# Patient Record
Sex: Female | Born: 1955
Health system: Southern US, Community
[De-identification: ages and names within clinical notes are randomized; demographics above are authoritative.]

## PROBLEM LIST (undated history)

## (undated) DIAGNOSIS — E669 Obesity, unspecified: Secondary | ICD-10-CM

## (undated) DIAGNOSIS — Z8619 Personal history of other infectious and parasitic diseases: Secondary | ICD-10-CM

## (undated) DIAGNOSIS — N189 Chronic kidney disease, unspecified: Secondary | ICD-10-CM

## (undated) DIAGNOSIS — E119 Type 2 diabetes mellitus without complications: Secondary | ICD-10-CM

## (undated) DIAGNOSIS — R809 Proteinuria, unspecified: Secondary | ICD-10-CM

## (undated) DIAGNOSIS — I10 Essential (primary) hypertension: Secondary | ICD-10-CM

## (undated) DIAGNOSIS — E785 Hyperlipidemia, unspecified: Secondary | ICD-10-CM

## (undated) DIAGNOSIS — I4891 Unspecified atrial fibrillation: Secondary | ICD-10-CM

## (undated) HISTORY — DX: Hyperlipidemia, unspecified: E78.5

## (undated) HISTORY — DX: Type 2 diabetes mellitus without complications: E11.9

## (undated) HISTORY — DX: Personal history of other infectious and parasitic diseases: Z86.19

## (undated) HISTORY — PX: COLONOSCOPY: SHX174

## (undated) HISTORY — PX: TONSILLECTOMY: SUR1361

## (undated) HISTORY — DX: Essential (primary) hypertension: I10

## (undated) HISTORY — DX: Proteinuria, unspecified: R80.9

## (undated) HISTORY — DX: Unspecified atrial fibrillation: I48.91

## (undated) HISTORY — DX: Obesity, unspecified: E66.9

---

## 1999-02-20 ENCOUNTER — Encounter: Admission: RE | Admit: 1999-02-20 | Discharge: 1999-05-21 | Payer: Self-pay | Admitting: Unknown Physician Specialty

## 2001-01-16 ENCOUNTER — Emergency Department (HOSPITAL_COMMUNITY): Admission: EM | Admit: 2001-01-16 | Discharge: 2001-01-16 | Payer: Self-pay | Admitting: Emergency Medicine

## 2004-02-21 ENCOUNTER — Ambulatory Visit (HOSPITAL_COMMUNITY): Admission: RE | Admit: 2004-02-21 | Discharge: 2004-02-21 | Payer: Self-pay | Admitting: Gastroenterology

## 2008-09-10 ENCOUNTER — Emergency Department (HOSPITAL_COMMUNITY): Admission: EM | Admit: 2008-09-10 | Discharge: 2008-09-10 | Payer: Self-pay | Admitting: Family Medicine

## 2009-10-24 ENCOUNTER — Emergency Department (HOSPITAL_COMMUNITY): Admission: EM | Admit: 2009-10-24 | Discharge: 2009-10-25 | Payer: Self-pay | Admitting: Emergency Medicine

## 2009-11-04 ENCOUNTER — Ambulatory Visit (HOSPITAL_COMMUNITY): Admission: RE | Admit: 2009-11-04 | Discharge: 2009-11-04 | Payer: Self-pay | Admitting: Gastroenterology

## 2010-10-30 ENCOUNTER — Other Ambulatory Visit: Payer: Self-pay | Admitting: *Deleted

## 2010-10-30 DIAGNOSIS — Z1231 Encounter for screening mammogram for malignant neoplasm of breast: Secondary | ICD-10-CM

## 2010-11-06 ENCOUNTER — Ambulatory Visit: Payer: Self-pay

## 2010-12-03 LAB — URINALYSIS, ROUTINE W REFLEX MICROSCOPIC
Bilirubin Urine: NEGATIVE
Glucose, UA: NEGATIVE mg/dL
Leukocytes, UA: NEGATIVE
Nitrite: NEGATIVE
Urobilinogen, UA: 0.2 mg/dL (ref 0.0–1.0)
pH: 5 (ref 5.0–8.0)

## 2010-12-03 LAB — DIFFERENTIAL
Eosinophils Absolute: 0.4 10*3/uL (ref 0.0–0.7)
Eosinophils Relative: 5 % (ref 0–5)
Lymphocytes Relative: 35 % (ref 12–46)
Lymphs Abs: 2.9 10*3/uL (ref 0.7–4.0)
Monocytes Absolute: 0.5 10*3/uL (ref 0.1–1.0)
Neutro Abs: 4.6 10*3/uL (ref 1.7–7.7)
Neutrophils Relative %: 54 % (ref 43–77)

## 2010-12-03 LAB — COMPREHENSIVE METABOLIC PANEL
ALT: 19 U/L (ref 0–35)
AST: 20 U/L (ref 0–37)
Albumin: 3.5 g/dL (ref 3.5–5.2)
GFR calc non Af Amer: >60 mL/min (ref >60)
Glucose, Bld: 106 mg/dL — ABNORMAL HIGH (ref 70–99)
Potassium: 3.5 mEq/L (ref 3.5–5.1)
Sodium: 138 mEq/L (ref 135–145)
Total Bilirubin: 0.2 mg/dL — ABNORMAL LOW (ref 0.3–1.2)
Total Protein: 7 g/dL (ref 6.0–8.3)

## 2010-12-03 LAB — URINE MICROSCOPIC-ADD ON

## 2010-12-03 LAB — CBC
HCT: 35.9 % — ABNORMAL LOW (ref 36.0–46.0)
Hemoglobin: 12 g/dL (ref 12.0–15.0)
MCHC: 33.4 g/dL (ref 30.0–36.0)
Platelets: 300 10*3/uL (ref 150–400)
RDW: 14.6 % (ref 11.5–15.5)
WBC: 8.4 10*3/uL (ref 4.0–10.5)

## 2010-12-03 LAB — GLUCOSE, CAPILLARY: Glucose-Capillary: 97 mg/dL (ref 70–99)

## 2010-12-03 LAB — LIPASE, BLOOD: Lipase: 38 U/L (ref 11–59)

## 2011-01-29 NOTE — Op Note (Signed)
NAME:  Kathy [name], Kathy [name]                     ACCOUNT NO.:  [id number]   MEDICAL RECORD NO.:  [id number]                   PATIENT TYPE:  AMB   LOCATION:  ENDO                                 FACILITY:  [facility]   PHYSICIAN:  Jeryl Columbia, M.D.                 DATE OF BIRTH:  [birth year]   DATE OF PROCEDURE:  02/21/2004  DATE OF DISCHARGE:                                 OPERATIVE REPORT   PROCEDURE:  Colonoscopy.   INDICATION:  Iron deficiency, probably due to her heavy periods but also  with a family history of colon cancer.  Consent was signed after risks,  benefits, methods, options thoroughly discussed in the office.   MEDICINES USED:  1. Demerol 50.  2. Versed 6.   DESCRIPTION OF PROCEDURE:  Rectal inspection was pertinent for external  hemorrhoids, small.  Digital exam was negative.  Video pediatric adjustable  colonoscope was inserted, easily advanced around the colon to the cecum.  This did require rolling her on her back and some abdominal pressure.  No  obvious abnormality was seen on insertion.  The cecum was identified by the  appendiceal orifice and the ileocecal valve.  In fact, the scope was  inserted a short ways in the terminal ileum which was normal.  Photodocumentation was obtained.  No blood was seen coming from above nor in  the colon.  The scope was slowly withdrawn.  The prep was adequate.  The  prep was better on the left than the right but with washing and suctioning,  adequate visualization was obtained.  On slow withdrawal through the colon,  no lesions were seen.  Specifically, no polyps, tumors, masses, diverticula,  or signs of bleeding.  Once back in the rectum, anorectal pull-through and  retroflexion confirmed some small hemorrhoids.  The scope was reinserted a  short ways up the left side of the colon; air was suctioned, scope removed.  The patient tolerated the procedure well.  There was no obvious immediate  complication.   ENDOSCOPIC  DIAGNOSES:  1. Small internal and external hemorrhoids.  2. Otherwise, within normal limits to the terminal ileum.   PLAN:  1. Recheck colon screening in 5 years.  2. Follow up p.r.n. or in 2 months to recheck symptoms, guaiacs, possibly a     CBC and make sure no further     gastrointestinal work-up and plans are needed.  3. Otherwise, resume iron and any further work-up and plans per Dr. Inda Merlin     and gynecology with regard to her heavy periods.                                               Jeryl Columbia, M.D.    MEM/MEDQ  D:  02/21/2004  T:  02/21/2004  Job:  [id number]   cc:   Frann Rider, M.D.  [place]  [place] [address]  Fax: [phone]

## 2012-11-16 ENCOUNTER — Encounter (INDEPENDENT_AMBULATORY_CARE_PROVIDER_SITE_OTHER): Payer: 59 | Admitting: Ophthalmology

## 2012-11-16 DIAGNOSIS — H2510 Age-related nuclear cataract, unspecified eye: Secondary | ICD-10-CM

## 2012-11-16 DIAGNOSIS — I10 Essential (primary) hypertension: Secondary | ICD-10-CM

## 2012-11-16 DIAGNOSIS — E11319 Type 2 diabetes mellitus with unspecified diabetic retinopathy without macular edema: Secondary | ICD-10-CM

## 2012-11-16 DIAGNOSIS — E1139 Type 2 diabetes mellitus with other diabetic ophthalmic complication: Secondary | ICD-10-CM

## 2012-11-16 DIAGNOSIS — H43819 Vitreous degeneration, unspecified eye: Secondary | ICD-10-CM

## 2012-11-16 DIAGNOSIS — H35039 Hypertensive retinopathy, unspecified eye: Secondary | ICD-10-CM

## 2013-08-06 ENCOUNTER — Encounter: Payer: Self-pay | Admitting: Medical

## 2013-08-06 ENCOUNTER — Ambulatory Visit (INDEPENDENT_AMBULATORY_CARE_PROVIDER_SITE_OTHER): Payer: 59 | Admitting: Medical

## 2013-08-06 VITALS — BP 132/90 | Temp 98.4°F | Ht 69.0 in | Wt 214.0 lb

## 2013-08-06 DIAGNOSIS — E118 Type 2 diabetes mellitus with unspecified complications: Secondary | ICD-10-CM | POA: Insufficient documentation

## 2013-08-06 DIAGNOSIS — Z23 Encounter for immunization: Secondary | ICD-10-CM

## 2013-08-06 DIAGNOSIS — I10 Essential (primary) hypertension: Secondary | ICD-10-CM | POA: Insufficient documentation

## 2013-08-06 DIAGNOSIS — R12 Heartburn: Secondary | ICD-10-CM

## 2013-08-06 DIAGNOSIS — E7849 Other hyperlipidemia: Secondary | ICD-10-CM | POA: Insufficient documentation

## 2013-08-06 DIAGNOSIS — K5900 Constipation, unspecified: Secondary | ICD-10-CM

## 2013-08-06 DIAGNOSIS — E119 Type 2 diabetes mellitus without complications: Secondary | ICD-10-CM

## 2013-08-06 DIAGNOSIS — E785 Hyperlipidemia, unspecified: Secondary | ICD-10-CM

## 2013-08-06 MED ORDER — SAXAGLIPTIN-METFORMIN ER 5-1000 MG PO TB24: 1.0000 | ORAL_TABLET | Freq: Every day | ORAL | Status: DC

## 2013-08-06 MED ORDER — SIMVASTATIN 20 MG PO TABS: 20.0000 mg | ORAL_TABLET | Freq: Every day | ORAL | Status: DC

## 2013-08-06 MED ORDER — LISINOPRIL-HYDROCHLOROTHIAZIDE 20-25 MG PO TABS: 1.0000 | ORAL_TABLET | Freq: Every day | ORAL | Status: DC

## 2013-08-06 NOTE — Patient Instructions (Signed)
Diabetes Meal Planning Guide The diabetes meal planning guide is a tool to help you plan your meals and snacks. It is important for people with diabetes to manage their blood glucose (sugar) levels. Choosing the right foods and the right amounts throughout your day will help control your blood glucose. Eating right can even help you improve your blood pressure and reach or maintain a healthy weight.  CARBOHYDRATE COUNTING MADE EASY When you eat carbohydrates, they turn to sugar. This raises your blood glucose level. Counting carbohydrates can help you control this level so you feel better. When you plan your meals by counting carbohydrates, you can have more flexibility in what you eat and balance your medicine with your food intake.  Carbohydrate counting simply means adding up the total amount of carbohydrate grams in your meals and snacks. Try to eat about the same amount at each meal. Foods with carbohydrates are listed below.   Each portion below is 1 carbohydrate serving or 15 grams of carbohydrates.    1800 Calorie Diet for Diabetes Meal Planning The 1800 calorie diet is designed for eating up to 1800 calories each day. Following this diet and making healthy meal choices can help improve overall health. This diet controls blood sugar (glucose) levels and can also help lower blood pressure and cholesterol.  SERVING SIZES Measuring foods and serving sizes helps to make sure you are getting the right amount of food. The list below tells how big or small some common serving sizes are:  1 oz.........4 stacked dice.  3 oz........Marland KitchenDeck of cards.  1 tsp.......Marland KitchenTip of little finger.  1 tbs......Marland KitchenMarland KitchenThumb.  2 tbs.......Marland KitchenGolf ball.   cup......Marland KitchenHalf of a fist.  1 cup.......Marland KitchenA fist.   GUIDELINES FOR CHOOSING FOODS The goal of this diet is to eat a variety of foods and limit calories to 1800 each day. This can be done by choosing foods that are low in calories and fat. The diet also suggests  eating small amounts of food frequently. Doing this helps control your blood glucose levels so they do not get too high or too low. Each meal or snack may include a protein food source to help you feel more satisfied and to stabilize your blood glucose. Try to eat about the same amount of food around the same time each day. This includes weekend days, travel days, and days off work. Space your meals about 4 to 5 hours apart and add a snack between them if you wish.  For example, a daily food plan could include breakfast, a morning snack, lunch, dinner, and an evening snack. Healthy meals and snacks include whole grains, vegetables, fruits, lean meats, poultry, fish, and dairy products. As you plan your meals, select a variety of foods. Choose from the bread and starch, vegetable, fruit, dairy, and meat/protein groups. Examples of foods from each group and their suggested serving sizes are listed below. Use measuring cups and spoons to become familiar with what a healthy portion looks like. Bread and Starch Each serving equals 15 grams of carbohydrates.  1 slice bread.   bagel.   cup cold cereal (unsweetened).   cup hot cereal or mashed potatoes.  1 small potato (size of a computer mouse).   cup cooked pasta or rice.   English muffin.  1 cup broth-based soup.  3 cups of popcorn.  4 to 6 whole-wheat crackers.   cup cooked beans, peas, or corn. Vegetable Each serving equals 5 grams of carbohydrates.   cup cooked vegetables.  1  cup raw vegetables.   cup tomato or vegetable juice. Fruit Each serving equals 15 grams of carbohydrates.  1 small apple or orange.  1 cup watermelon or strawberries.   cup applesauce (no sugar added).  2 tbs raisins.   banana.   cup canned fruit, packed in water, its own juice, or sweetened with a sugar substitute.   cup unsweetened fruit juice. Dairy Each serving equals 12 to 15 grams of carbohydrates.  1 cup fat-free milk.  6  oz artificially sweetened yogurt or plain yogurt.  1 cup low-fat buttermilk.  1 cup soy milk.  1 cup almond milk. Meat/Protein  1 large egg.  2 to 3 oz meat, poultry, or fish.   cup low-fat cottage cheese.  1 tbs peanut butter.  1 oz low-fat cheese.   cup tuna in water.   cup tofu. Fat  1 tsp oil.  1 tsp trans-fat-free margarine.  1 tsp butter.  1 tsp mayonnaise.  2 tbs avocado.  1 tbs salad dressing.  1 tbs cream cheese.  2 tbs sour cream.  SAMPLE 1800 CALORIE DIET PLAN Breakfast   cup unsweetened cereal (1 carb serving).  1 cup fat-free milk (1 carb serving).  1 slice whole-wheat toast (1 carb serving).   small banana (1 carb serving).  1 scrambled egg.  1 tsp trans-fat-free margarine. Lunch  Tuna sandwich.  2 slices whole-wheat bread (2 carb servings).   cup canned tuna in water, drained.  1 tbs reduced fat mayonnaise.  1 stalk celery, chopped.  2 slices tomato.  1 lettuce leaf.  1 cup carrot sticks.  24 to 30 seedless grapes (2 carb servings).  6 oz light yogurt (1 carb serving). Afternoon Snack  3 graham cracker squares (1 carb serving).  Fat-free milk, 1 cup (1 carb serving).  1 tbs peanut butter. Dinner  3 oz salmon, broiled with 1 tsp oil.  1 cup mashed potatoes (2 carb servings) with 1 tsp trans-fat-free margarine.  1 cup fresh or frozen green beans.  1 cup steamed asparagus.  1 cup fat-free milk (1 carb serving). Evening Snack  3 cups air-popped popcorn (1 carb serving).  2 tbs parmesan cheese sprinkled on top.  MEAL PLAN Use this worksheet to help you make a daily meal plan based on the 1800 calorie diet suggestions. If you are using this plan to help you control your blood glucose, you may interchange carbohydrate-containing foods (dairy, starches, and fruits). Select a variety of fresh foods of varying colors and flavors. The total amount of carbohydrate in your meals or snacks is more important  than making sure you include all of the food groups every time you eat. Choose from the following foods to build your day's meals:  8 Starches.  4 Vegetables.  3 Fruits.  2 Dairy.  6 to 7 oz Meat/Protein.  Up to 4 Fats.  Your dietician can use this worksheet to help you decide how many servings and which types of foods are right for you. BREAKFAST Food Group and Servings / Food Choice Starch ________________________________________________________ Dairy _________________________________________________________ Fruit _________________________________________________________ Meat/Protein __________________________________________________ Fat ___________________________________________________________ LUNCH Food Group and Servings / Food Choice Starch ________________________________________________________ Meat/Protein __________________________________________________ Vegetable _____________________________________________________ Fruit _________________________________________________________ Dairy _________________________________________________________ Fat ___________________________________________________________ Wilhemina Bonito Food Group and Servings / Food Choice Starch ________________________________________________________ Meat/Protein __________________________________________________ Fruit __________________________________________________________ Dairy _________________________________________________________ Wonda Cheng Food Group and Servings / Food Choice Starch _________________________________________________________ Meat/Protein ___________________________________________________ Dairy __________________________________________________________ Vegetable ______________________________________________________ Fruit ___________________________________________________________ Fat ____________________________________________________________ Fermin Schwab Food  Group and Servings / Food Choice  Fruit __________________________________________________________ Meat/Protein ___________________________________________________ Dairy __________________________________________________________ Starch _________________________________________________________ DAILY TOTALS Starch ____________________________ Vegetable _________________________ Fruit _____________________________ Dairy _____________________________ Meat/Protein______________________ Fat _______________________________ Document Released: 03/22/2005 Document Revised: 11/22/2011 Document Reviewed: 07/16/2011 [facility] Patient Information 2013 [place], [place].

## 2013-08-06 NOTE — Progress Notes (Signed)
  Subjective:    Kathy [name] is a 57 y.o. female who presents as a new patient today.  Prior she has just been using [facility] for her routine followups and medications.  She is here today for medication refills and recheck on her chronic issues including diabetes type 2, hyperlipidemia and hypertension.  Diagnosed with diabetes 1999.  Was originally put on metformin, later on added Metaglip was added.  HgbA1c numbers have usually been between 6 and 7%..  she has been out of test strips recently, but normally checks her sugars daily to twice daily.  Fasting glucose numbers 160-170.  2-3 hours postprandial in the evening would be 130-140.  She checks her feet regularly, no concerns.  She trying to eat healthy, walk 30 minutes daily for exercise.  Last eye exam:  11/02/12, possible retinopathy per her eye doctor, but different doctor said she didn't.  Her diet weakness is sugar/sweets.  Uses portion control.   She has a history of hypertension, has been on lisinopril HCTZ since 1999.  Checking blood pressures regularly.  Hx/o hyperlipidemia, on statin.  Has not had yearly flu, never pneumococcal vaccine, last tetanus unsure.    She reports last 2 weeks having some intermittent left chest burning pain, only at nighttime, intermittent not daily, last a few minutes at a time and resolves.   The following portions of the patient's history were reviewed and updated as appropriate: allergies, current medications, past family history, past medical history, past social history, past surgical history and problem list.  ROS as in subjective above    Objective:    BP 132/90  Temp(Src) 98.4 F (36.9 C) (Oral)  Ht 5\' 9"  (1.753 m)  Wt 214 lb (97.07 kg)  BMI 31.59 kg/m2  General appearance: alert, no distress, WD/WN, AA female Neck: supple, no lymphadenopathy, no thyromegaly, no masses Heart: RRR, normal S1, S2, no murmurs Lungs: CTA bilaterally, no wheezes, rhonchi, or  rales Abdomen: +bs, soft, non tender, non distended, no masses, no hepatomegaly, no splenomegaly Pulses: 2+ symmetric, upper and lower extremities, normal cap refill Ext: no edema Foot exam: not examined due to tall hose on today.   Will defer to next visit.   Assessment:   Encounter Diagnoses  Name Primary?  . Type II or unspecified type diabetes mellitus without mention of complication, not stated as uncontrolled Yes  . Hyperlipidemia   . Essential hypertension, benign   . Need for prophylactic vaccination and inoculation against influenza   . Need for prophylactic vaccination against Streptococcus pneumoniae (pneumococcus)   . Heartburn   . Unspecified constipation       Plan:   DM type 2 - stop metformin, change to Kombiglyze XR 5/1000mg  daily.  Discussed healthy diabetic diet, continuing exercise, gave handout on recommendations for diet, followup in 3 months. Hyperlipidemia - continue Zocor 20 mg daily HTN - continue current medication lisinopril HCT 20/25 mg daily Will request records from both ophthalmology and urgent care including recent labs Counseled on the influenza virus vaccine.  Vaccine information sheet given.  Influenza vaccine given after consent obtained. Counseled on the pneumococcal vaccine.  Vaccine information sheet given.  Pneumococcal vaccine given after consent obtained. Heartburn - can use OTC H2 blocker prn, avoid acidic and spicy foods constipation - discussed fiber and water intake

## 2013-11-06 ENCOUNTER — Encounter: Payer: Self-pay | Admitting: Medical

## 2013-11-06 ENCOUNTER — Ambulatory Visit (INDEPENDENT_AMBULATORY_CARE_PROVIDER_SITE_OTHER): Payer: 59 | Admitting: Medical

## 2013-11-06 VITALS — BP 140/82 | HR 64 | Temp 98.0°F | Resp 16 | Wt 217.0 lb

## 2013-11-06 DIAGNOSIS — E785 Hyperlipidemia, unspecified: Secondary | ICD-10-CM

## 2013-11-06 DIAGNOSIS — I10 Essential (primary) hypertension: Secondary | ICD-10-CM

## 2013-11-06 DIAGNOSIS — E119 Type 2 diabetes mellitus without complications: Secondary | ICD-10-CM

## 2013-11-06 LAB — CBC
HEMATOCRIT: 36.1 % (ref 36.0–46.0)
Hemoglobin: 11.9 g/dL — ABNORMAL LOW (ref 12.0–15.0)
MCH: 26.1 pg (ref 26.0–34.0)
MCHC: 33 g/dL (ref 30.0–36.0)
MCV: 79.2 fL (ref 78.0–100.0)
Platelets: 337 10*3/uL (ref 150–400)
RBC: 4.56 MIL/uL (ref 3.87–5.11)
RDW: 14.2 % (ref 11.5–15.5)
WBC: 7.3 10*3/uL (ref 4.0–10.5)

## 2013-11-06 LAB — COMPREHENSIVE METABOLIC PANEL
ALBUMIN: 4 g/dL (ref 3.5–5.2)
ALK PHOS: 90 U/L (ref 39–117)
ALT: 11 U/L (ref 0–35)
AST: 11 U/L (ref 0–37)
BUN: 20 mg/dL (ref 6–23)
CALCIUM: 10 mg/dL (ref 8.4–10.5)
CO2: 23 mEq/L (ref 19–32)
CREATININE: 0.8 mg/dL (ref 0.50–1.10)
Chloride: 104 mEq/L (ref 96–112)
Glucose, Bld: 172 mg/dL — ABNORMAL HIGH (ref 70–99)
Potassium: 4.3 mEq/L (ref 3.5–5.3)
SODIUM: 139 meq/L (ref 135–145)
TOTAL PROTEIN: 7.5 g/dL (ref 6.0–8.3)
Total Bilirubin: 0.4 mg/dL (ref 0.2–1.2)

## 2013-11-06 LAB — HEMOGLOBIN A1C
Hgb A1c MFr Bld: 7.7 % — ABNORMAL HIGH (ref <5.7)
MEAN PLASMA GLUCOSE: 174 mg/dL — AB (ref <117)

## 2013-11-06 LAB — LIPID PANEL
CHOL/HDL RATIO: 2.3 ratio
Cholesterol: 183 mg/dL (ref 0–200)
HDL: 80 mg/dL (ref >39)
LDL CALC: 90 mg/dL (ref 0–99)
TRIGLYCERIDES: 65 mg/dL (ref <150)
VLDL: 13 mg/dL (ref 0–40)

## 2013-11-06 NOTE — Assessment & Plan Note (Signed)
Compliant with medication no side effects.

## 2013-11-06 NOTE — Assessment & Plan Note (Signed)
Compliant with medication, no side effects of the medication. Does not check her blood pressures on routine basis.  Limits salt intake.

## 2013-11-06 NOTE — Assessment & Plan Note (Signed)
Checks morning glucose every other day, and since we changed to Kombiglyze XR 01/999 at last visit, most of her sugars have been under 130 fasting. However when she does eat something and she should not be eating, she definitely sees higher numbers. She tries to limit sweets.  She does have several questions about diet today. Her last nutritionist visit was several years ago. She checks her feet some days, not every day. Last eye doctor visit was just a few months ago. No other concerns, no symptoms of hypoglycemia.

## 2013-11-06 NOTE — Patient Instructions (Signed)
Diabetes Meal Planning Guide The diabetes meal planning guide is a tool to help you plan your meals and snacks. It is important for people with diabetes to manage their blood glucose (sugar) levels. Choosing the right foods and the right amounts throughout your day will help control your blood glucose. Eating right can even help you improve your blood pressure and reach or maintain a healthy weight.  CARBOHYDRATE COUNTING MADE EASY When you eat carbohydrates, they turn to sugar. This raises your blood glucose level. Counting carbohydrates can help you control this level so you feel better. When you plan your meals by counting carbohydrates, you can have more flexibility in what you eat and balance your medicine with your food intake.  Carbohydrate counting simply means adding up the total amount of carbohydrate grams in your meals and snacks. Try to eat about the same amount at each meal. Foods with carbohydrates are listed below.   Each portion below is 1 carbohydrate serving or 15 grams of carbohydrates.    1800 Calorie Diet for Diabetes Meal Planning The 1800 calorie diet is designed for eating up to 1800 calories each day. Following this diet and making healthy meal choices can help improve overall health. This diet controls blood sugar (glucose) levels and can also help lower blood pressure and cholesterol.  SERVING SIZES Measuring foods and serving sizes helps to make sure you are getting the right amount of food. The list below tells how big or small some common serving sizes are:  1 oz.........4 stacked dice.  3 oz........Marland KitchenDeck of cards.  1 tsp.......Marland KitchenTip of little finger.  1 tbs......Marland KitchenMarland KitchenThumb.  2 tbs.......Marland KitchenGolf ball.   cup......Marland KitchenHalf of a fist.  1 cup.......Marland KitchenA fist.   GUIDELINES FOR CHOOSING FOODS The goal of this diet is to eat a variety of foods and limit calories to 1800 each day. This can be done by choosing foods that are low in calories and fat. The diet also suggests  eating small amounts of food frequently. Doing this helps control your blood glucose levels so they do not get too high or too low. Each meal or snack may include a protein food source to help you feel more satisfied and to stabilize your blood glucose. Try to eat about the same amount of food around the same time each day. This includes weekend days, travel days, and days off work. Space your meals about 4 to 5 hours apart and add a snack between them if you wish.  For example, a daily food plan could include breakfast, a morning snack, lunch, dinner, and an evening snack. Healthy meals and snacks include whole grains, vegetables, fruits, lean meats, poultry, fish, and dairy products. As you plan your meals, select a variety of foods. Choose from the bread and starch, vegetable, fruit, dairy, and meat/protein groups. Examples of foods from each group and their suggested serving sizes are listed below. Use measuring cups and spoons to become familiar with what a healthy portion looks like. Bread and Starch Each serving equals 15 grams of carbohydrates.  1 slice bread.   bagel.   cup cold cereal (unsweetened).   cup hot cereal or mashed potatoes.  1 small potato (size of a computer mouse).   cup cooked pasta or rice.   English muffin.  1 cup broth-based soup.  3 cups of popcorn.  4 to 6 whole-wheat crackers.   cup cooked beans, peas, or corn. Vegetable Each serving equals 5 grams of carbohydrates.   cup cooked vegetables.  1  cup raw vegetables.   cup tomato or vegetable juice. Fruit Each serving equals 15 grams of carbohydrates.  1 small apple or orange.  1 cup watermelon or strawberries.   cup applesauce (no sugar added).  2 tbs raisins.   banana.   cup canned fruit, packed in water, its own juice, or sweetened with a sugar substitute.   cup unsweetened fruit juice. Dairy Each serving equals 12 to 15 grams of carbohydrates.  1 cup fat-free milk.  6  oz artificially sweetened yogurt or plain yogurt.  1 cup low-fat buttermilk.  1 cup soy milk.  1 cup almond milk. Meat/Protein  1 large egg.  2 to 3 oz meat, poultry, or fish.   cup low-fat cottage cheese.  1 tbs peanut butter.  1 oz low-fat cheese.   cup tuna in water.   cup tofu. Fat  1 tsp oil.  1 tsp trans-fat-free margarine.  1 tsp butter.  1 tsp mayonnaise.  2 tbs avocado.  1 tbs salad dressing.  1 tbs cream cheese.  2 tbs sour cream.  SAMPLE 1800 CALORIE DIET PLAN Breakfast   cup unsweetened cereal (1 carb serving).  1 cup fat-free milk (1 carb serving).  1 slice whole-wheat toast (1 carb serving).   small banana (1 carb serving).  1 scrambled egg.  1 tsp trans-fat-free margarine. Lunch  Tuna sandwich.  2 slices whole-wheat bread (2 carb servings).   cup canned tuna in water, drained.  1 tbs reduced fat mayonnaise.  1 stalk celery, chopped.  2 slices tomato.  1 lettuce leaf.  1 cup carrot sticks.  24 to 30 seedless grapes (2 carb servings).  6 oz light yogurt (1 carb serving). Afternoon Snack  3 graham cracker squares (1 carb serving).  Fat-free milk, 1 cup (1 carb serving).  1 tbs peanut butter. Dinner  3 oz salmon, broiled with 1 tsp oil.  1 cup mashed potatoes (2 carb servings) with 1 tsp trans-fat-free margarine.  1 cup fresh or frozen green beans.  1 cup steamed asparagus.  1 cup fat-free milk (1 carb serving). Evening Snack  3 cups air-popped popcorn (1 carb serving).  2 tbs parmesan cheese sprinkled on top.  MEAL PLAN Use this worksheet to help you make a daily meal plan based on the 1800 calorie diet suggestions. If you are using this plan to help you control your blood glucose, you may interchange carbohydrate-containing foods (dairy, starches, and fruits). Select a variety of fresh foods of varying colors and flavors. The total amount of carbohydrate in your meals or snacks is more important  than making sure you include all of the food groups every time you eat. Choose from the following foods to build your day's meals:  8 Starches.  4 Vegetables.  3 Fruits.  2 Dairy.  6 to 7 oz Meat/Protein.  Up to 4 Fats.  Your dietician can use this worksheet to help you decide how many servings and which types of foods are right for you. BREAKFAST Food Group and Servings / Food Choice Starch ________________________________________________________ Dairy _________________________________________________________ Fruit _________________________________________________________ Meat/Protein __________________________________________________ Fat ___________________________________________________________ LUNCH Food Group and Servings / Food Choice Starch ________________________________________________________ Meat/Protein __________________________________________________ Vegetable _____________________________________________________ Fruit _________________________________________________________ Dairy _________________________________________________________ Fat ___________________________________________________________ Wilhemina Bonito Food Group and Servings / Food Choice Starch ________________________________________________________ Meat/Protein __________________________________________________ Fruit __________________________________________________________ Dairy _________________________________________________________ Wonda Cheng Food Group and Servings / Food Choice Starch _________________________________________________________ Meat/Protein ___________________________________________________ Dairy __________________________________________________________ Vegetable ______________________________________________________ Fruit ___________________________________________________________ Fat ____________________________________________________________ Fermin Schwab Food  Group and Servings / Food Choice  Fruit __________________________________________________________ Meat/Protein ___________________________________________________ Dairy __________________________________________________________ Starch _________________________________________________________ DAILY TOTALS Starch ____________________________ Vegetable _________________________ Fruit _____________________________ Dairy _____________________________ Meat/Protein______________________ Fat _______________________________ Document Released: 03/22/2005 Document Revised: 11/22/2011 Document Reviewed: 07/16/2011 [facility] Patient Information 2013 [place], [place].

## 2013-11-06 NOTE — Progress Notes (Signed)
   Subjective:   Kathy [name] is a 58 y.o. female presenting on 11/06/2013 with 3 month follow-up  She reports trying to eat a healthy diet, exercising with walking aerobics. Compliant with her medications .  She first came here as a new patient in November, thus this is her first routine diabetic follow up, fasting for labs today  No specific new problems  Essential hypertension, benign Compliant with medication, no side effects of the medication. Does not check her blood pressures on routine basis.  Limits salt intake.  Hyperlipidemia Compliant with medication no side effects.  Type II or unspecified type diabetes mellitus without mention of complication, not stated as uncontrolled Checks morning glucose every other day, and since we changed to Kombiglyze XR 01/999 at last visit, most of her sugars have been under 130 fasting. However when she does eat something and she should not be eating, she definitely sees higher numbers. She tries to limit sweets.  She does have several questions about diet today. Her last nutritionist visit was several years ago. She checks her feet some days, not every day. Last eye doctor visit was just a few months ago. No other concerns, no symptoms of hypoglycemia.  No other complaint.  Review of Systems ROS as in subjective      Objective:     Filed Vitals:   11/06/13 1040  BP: 140/82  Pulse: 64  Temp: 98 F (36.7 C)  Resp: 16    General appearance: alert, no distress, WD/WN, AA female Neck: supple, no lymphadenopathy, no thyromegaly, no masses, no bruits Heart: RRR, normal S1, S2, no murmurs Lungs: CTA bilaterally, no wheezes, rhonchi, or rales Abdomen: +bs, soft, non tender, non distended, no masses, no hepatomegaly, no splenomegaly Pulses: 2+ symmetric, upper and lower extremities, normal cap refill See separate foot exam     Assessment: Encounter Diagnoses  Name Primary?  . Type II or unspecified type diabetes mellitus  without mention of complication, not stated as uncontrolled Yes  . Essential hypertension, benign   . Hyperlipidemia      Plan: Routine labs today, continue current medications, continue regular exercise, I have reviewed her most recent eye exam from February 2015, recommended daily foot checks, checking fasting glucose every morning.  We spent the majority of the visit today going over carb counting, carbohydrate choices, meal planning, handout given. Consider nutritionist referral next visit   [name] was seen today for 3 month follow-up.  Diagnoses and associated orders for this visit:  Type II or unspecified type diabetes mellitus without mention of complication, not stated as uncontrolled - Comprehensive metabolic panel - Hemoglobin A1c - HM DIABETES FOOT EXAM - Lipid panel - Microalbumin / creatinine urine ratio - CBC  Essential hypertension, benign - Comprehensive metabolic panel - Hemoglobin A1c - HM DIABETES FOOT EXAM - Lipid panel - Microalbumin / creatinine urine ratio - CBC  Hyperlipidemia - Comprehensive metabolic panel - Hemoglobin A1c - HM DIABETES FOOT EXAM - Lipid panel - Microalbumin / creatinine urine ratio - CBC    Return pending labs.

## 2013-11-07 ENCOUNTER — Other Ambulatory Visit: Payer: Self-pay | Admitting: Medical

## 2013-11-07 LAB — MICROALBUMIN / CREATININE URINE RATIO
Creatinine, Urine: 58.7 mg/dL
Microalb Creat Ratio: 1657.9 mg/g — ABNORMAL HIGH (ref 0.0–30.0)
Microalb, Ur: 97.32 mg/dL — ABNORMAL HIGH (ref 0.00–1.89)

## 2013-11-07 MED ORDER — LISINOPRIL-HYDROCHLOROTHIAZIDE 20-25 MG PO TABS: 1.0000 | ORAL_TABLET | Freq: Every day | ORAL | Status: DC

## 2013-11-07 MED ORDER — SAXAGLIPTIN-METFORMIN ER 5-1000 MG PO TB24: 1.0000 | ORAL_TABLET | Freq: Every day | ORAL | Status: DC

## 2013-11-07 MED ORDER — SIMVASTATIN 20 MG PO TABS: 20.0000 mg | ORAL_TABLET | Freq: Every day | ORAL | Status: DC

## 2013-11-07 NOTE — Progress Notes (Signed)
LMOM TO CB. CLS 

## 2013-11-09 NOTE — Progress Notes (Signed)
No answer, no voicemail pick up

## 2014-01-31 ENCOUNTER — Telehealth: Payer: Self-pay | Admitting: Medical

## 2014-01-31 NOTE — Telephone Encounter (Signed)
Patient called for refills but per chart Shane refilled 11/07/13. She was calling in with old Rx # off bottles, she will call pharmacy back and have them check under her name and WCB if any problems

## 2014-03-13 ENCOUNTER — Encounter: Payer: Self-pay | Admitting: Medical

## 2014-03-13 ENCOUNTER — Telehealth: Payer: Self-pay | Admitting: Medical

## 2014-03-13 ENCOUNTER — Ambulatory Visit (INDEPENDENT_AMBULATORY_CARE_PROVIDER_SITE_OTHER): Payer: 59 | Admitting: Medical

## 2014-03-13 VITALS — BP 130/80 | HR 60 | Temp 98.1°F | Resp 14 | Wt 214.0 lb

## 2014-03-13 DIAGNOSIS — I10 Essential (primary) hypertension: Secondary | ICD-10-CM

## 2014-03-13 DIAGNOSIS — R809 Proteinuria, unspecified: Secondary | ICD-10-CM

## 2014-03-13 DIAGNOSIS — H9209 Otalgia, unspecified ear: Secondary | ICD-10-CM

## 2014-03-13 DIAGNOSIS — H9201 Otalgia, right ear: Secondary | ICD-10-CM

## 2014-03-13 DIAGNOSIS — L723 Sebaceous cyst: Secondary | ICD-10-CM

## 2014-03-13 DIAGNOSIS — E785 Hyperlipidemia, unspecified: Secondary | ICD-10-CM

## 2014-03-13 DIAGNOSIS — Z23 Encounter for immunization: Secondary | ICD-10-CM

## 2014-03-13 DIAGNOSIS — E119 Type 2 diabetes mellitus without complications: Secondary | ICD-10-CM

## 2014-03-13 LAB — RENAL FUNCTION PANEL
ALBUMIN: 4.3 g/dL (ref 3.5–5.2)
BUN: 39 mg/dL — AB (ref 6–23)
CALCIUM: 10 mg/dL (ref 8.4–10.5)
CHLORIDE: 106 meq/L (ref 96–112)
CO2: 20 meq/L (ref 19–32)
Creat: 1.08 mg/dL (ref 0.50–1.10)
GLUCOSE: 103 mg/dL — AB (ref 70–99)
PHOSPHORUS: 3.7 mg/dL (ref 2.3–4.6)
POTASSIUM: 4.9 meq/L (ref 3.5–5.3)
SODIUM: 136 meq/L (ref 135–145)

## 2014-03-13 LAB — POCT URINALYSIS DIPSTICK
Bilirubin, UA: NEGATIVE
Glucose, UA: NEGATIVE
Ketones, UA: NEGATIVE
Leukocytes, UA: NEGATIVE
NITRITE UA: NEGATIVE
PH UA: 5
RBC UA: NEGATIVE
Spec Grav, UA: 1.01
UROBILINOGEN UA: NEGATIVE

## 2014-03-13 LAB — POCT GLYCOSYLATED HEMOGLOBIN (HGB A1C): HEMOGLOBIN A1C: 6.7

## 2014-03-13 MED ORDER — AZILSARTAN-CHLORTHALIDONE 40-25 MG PO TABS: 1.0000 | ORAL_TABLET | Freq: Every day | ORAL | Status: DC

## 2014-03-13 NOTE — Progress Notes (Signed)
  Subjective:   Kathy [name] is an 58 y.o. female who presents for follow up of Type 2 diabetes mellitus, hyperlipidemia, HTN.   Patient is checking home blood sugars.   Home blood sugar records: morning BS 120's and the evening is 90's Current symptoms include: none. Patient denies nonthing.  Patient is checking their feet daily. Foot concerns (callous, ulcer, wound, thickened nails, toenail fungus, skin fungus, hammer toe): no concerns Last dilated eye exam 02/2013  Current treatments: Continued kombiglyze which has been effective. Medication compliance: excellent  Current diet: in general, a "healthy" diet   Current exercise: walking Known diabetic complications: none  HTN - compliant with medication without c/o.  Hyperlipidemia - compliant with medication without c/o.  Having some right ear discomfort, unsure why.  No uri symptoms.  Does have ongoing night sweats attributed to menopause   The following portions of the patient's history were reviewed and updated as appropriate: allergies, current medications, past family history, past medical history, past social history, past surgical history and problem list.  ROS as in subjective above    Objective:   Filed Vitals:   03/13/14 0905  BP: 130/80  Pulse: 60  Temp: 98.1 F (36.7 C)  Resp: 14    General appearance: alert, no distress, WD/WN, AA female Skin: left postauricular area with 1.5cm raised papular lesion c/w sebaceous cyst, no erythema or fluctuance, seems to be improving HEENT: normocephalic, sclerae anicteric, TMs pearly, nares patent, no discharge or erythema, pharynx normal Oral cavity: MMM, no lesions Neck: supple, no lymphadenopathy, no thyromegaly, no masses Heart: RRR, normal S1, S2, no murmurs Lungs: CTA bilaterally, no wheezes, rhonchi, or rales Abdomen: +bs, soft, non tender, non distended, no masses, no hepatomegaly, no splenomegaly Pulses: 2+ symmetric, upper and lower extremities, normal  cap refill Ext: no edema   Assessment:   Encounter Diagnoses  Name Primary?  . Type II or unspecified type diabetes mellitus without mention of complication, not stated as uncontrolled Yes  . Microalbuminuria   . Essential hypertension, benign   . Hyperlipidemia   . Sebaceous cyst   . Otalgia of right ear   . Need for Tdap vaccination     Plan:   Diabetes type 2-improved from last visit in February at which time she had a hemoglobin A1c of 7.7%, today is 6.7%.  Continue Kombiglyze XR, referral [employer] diabetic study.  Continue healthy diet, continue routine exercise  Microalbuminuria- new finding.  We'll check a renal function panel today, discussed the significance of this, discussed her blood pressure medication, having good blood pressure and diabetic control  Hypertension-if insurance will cover, we will use trial of Edarbychlor 40/25mg  daily for better control.  Hyperlipidemia-reviewed labs from February at goal, continue current medication  Otalgia of right ear-may be due to the cyst behind her right ear which is improving.  Advise warm compresses, but if not improving in the next 7 days let me know we'll either use antibiotic for I&D  Counseled on the Tdap (tetanus, diptheria, and acellular pertussis) vaccine.  Vaccine information sheet given. Tdap vaccine given after consent obtained.

## 2014-03-13 NOTE — Telephone Encounter (Signed)
I fax over her information to [employer]. CLS

## 2014-03-13 NOTE — Telephone Encounter (Signed)
Not sure if she qualifies, but if so, refer for [employer] diabetes study

## 2014-03-14 LAB — VITAMIN D 25 HYDROXY (VIT D DEFICIENCY, FRACTURES): VIT D 25 HYDROXY: 46 ng/mL (ref 30–89)

## 2014-03-14 NOTE — Progress Notes (Signed)
Unable to reach patient via home or cell number. Home just rings and cell has been changed.

## 2014-03-19 ENCOUNTER — Telehealth: Payer: Self-pay | Admitting: Medical

## 2014-03-19 NOTE — Telephone Encounter (Signed)
Message below is in error.   Disregard.

## 2014-03-19 NOTE — Telephone Encounter (Signed)
pls call her back.  I added Actos once daily to her regimen.  Have her take 1/2 tablet daily for up to 2 weeks, and if she is tolerating it at that point, increase to 1 tablet daily (30mg ).  C/t all other medications as usual.     I do want her to consider adding Lisinopril once daily for renal protection.  Have her consider this and let me know.    I would like her to check glucose a few days per week.  Goal is <130 glucose daily in the morning fasting.    She doesn't not qualify for the current diabetes study.  Recheck 91mo.

## 2014-03-20 ENCOUNTER — Encounter: Payer: Self-pay | Admitting: Family Medicine

## 2014-09-10 ENCOUNTER — Telehealth: Payer: Self-pay | Admitting: Medical

## 2014-09-10 ENCOUNTER — Other Ambulatory Visit: Payer: Self-pay | Admitting: Family Medicine

## 2014-09-10 MED ORDER — AZILSARTAN-CHLORTHALIDONE 40-25 MG PO TABS: 1.0000 | ORAL_TABLET | Freq: Every day | ORAL | Status: DC

## 2014-09-10 NOTE — Telephone Encounter (Signed)
Rx refills was sent to her pharmacy

## 2014-09-13 DIAGNOSIS — R809 Proteinuria, unspecified: Secondary | ICD-10-CM

## 2014-09-13 HISTORY — DX: Proteinuria, unspecified: R80.9

## 2014-11-21 ENCOUNTER — Other Ambulatory Visit: Payer: Self-pay | Admitting: Medical

## 2014-11-28 ENCOUNTER — Other Ambulatory Visit: Payer: Self-pay | Admitting: Medical

## 2014-11-28 NOTE — Telephone Encounter (Signed)
Ok to RF? 

## 2014-12-02 ENCOUNTER — Telehealth: Payer: Self-pay | Admitting: Medical

## 2014-12-02 ENCOUNTER — Other Ambulatory Visit: Payer: Self-pay | Admitting: Medical

## 2014-12-02 MED ORDER — SIMVASTATIN 20 MG PO TABS: 20.0000 mg | ORAL_TABLET | Freq: Every day | ORAL | Status: DC

## 2014-12-02 NOTE — Telephone Encounter (Signed)
Pt scheduled med check for 4/8, needs refill on cholesterol med, she is out    [facility]

## 2014-12-17 ENCOUNTER — Ambulatory Visit (INDEPENDENT_AMBULATORY_CARE_PROVIDER_SITE_OTHER): Payer: 59 | Admitting: Medical

## 2014-12-17 ENCOUNTER — Encounter: Payer: Self-pay | Admitting: Medical

## 2014-12-17 VITALS — BP 122/80 | HR 73 | Resp 14 | Wt 217.0 lb

## 2014-12-17 DIAGNOSIS — R809 Proteinuria, unspecified: Secondary | ICD-10-CM

## 2014-12-17 DIAGNOSIS — E1165 Type 2 diabetes mellitus with hyperglycemia: Secondary | ICD-10-CM

## 2014-12-17 DIAGNOSIS — M1712 Unilateral primary osteoarthritis, left knee: Secondary | ICD-10-CM

## 2014-12-17 DIAGNOSIS — I10 Essential (primary) hypertension: Secondary | ICD-10-CM | POA: Diagnosis not present

## 2014-12-17 DIAGNOSIS — M179 Osteoarthritis of knee, unspecified: Secondary | ICD-10-CM

## 2014-12-17 DIAGNOSIS — L84 Corns and callosities: Secondary | ICD-10-CM

## 2014-12-17 DIAGNOSIS — E785 Hyperlipidemia, unspecified: Secondary | ICD-10-CM | POA: Diagnosis not present

## 2014-12-17 DIAGNOSIS — IMO0002 Reserved for concepts with insufficient information to code with codable children: Secondary | ICD-10-CM

## 2014-12-17 LAB — COMPREHENSIVE METABOLIC PANEL
ALBUMIN: 4.4 g/dL (ref 3.5–5.2)
ALK PHOS: 87 U/L (ref 39–117)
ALT: 12 U/L (ref 0–35)
AST: 12 U/L (ref 0–37)
BUN: 28 mg/dL — AB (ref 6–23)
CALCIUM: 10 mg/dL (ref 8.4–10.5)
CHLORIDE: 104 meq/L (ref 96–112)
CO2: 20 meq/L (ref 19–32)
Creat: 1.03 mg/dL (ref 0.50–1.10)
GLUCOSE: 127 mg/dL — AB (ref 70–99)
Potassium: 4.6 mEq/L (ref 3.5–5.3)
Sodium: 137 mEq/L (ref 135–145)
Total Bilirubin: 0.5 mg/dL (ref 0.2–1.2)
Total Protein: 7.6 g/dL (ref 6.0–8.3)

## 2014-12-17 LAB — CBC
HCT: 38 % (ref 36.0–46.0)
Hemoglobin: 12.4 g/dL (ref 12.0–15.0)
MCH: 25.9 pg — ABNORMAL LOW (ref 26.0–34.0)
MCHC: 32.6 g/dL (ref 30.0–36.0)
MCV: 79.5 fL (ref 78.0–100.0)
MPV: 9.4 fL (ref 8.6–12.4)
Platelets: 321 10*3/uL (ref 150–400)
RBC: 4.78 MIL/uL (ref 3.87–5.11)
RDW: 14.4 % (ref 11.5–15.5)
WBC: 7 10*3/uL (ref 4.0–10.5)

## 2014-12-17 LAB — LIPID PANEL
Cholesterol: 188 mg/dL (ref 0–200)
HDL: 81 mg/dL (ref >=46)
LDL CALC: 87 mg/dL (ref 0–99)
Total CHOL/HDL Ratio: 2.3 Ratio
Triglycerides: 99 mg/dL (ref <150)
VLDL: 20 mg/dL (ref 0–40)

## 2014-12-17 LAB — POCT GLYCOSYLATED HEMOGLOBIN (HGB A1C): HEMOGLOBIN A1C: 7.4

## 2014-12-17 NOTE — Progress Notes (Signed)
  Subjective:   Jaena Mcenroe is an 59 y.o. female who presents for follow up of Type 2 diabetes mellitus and chronic issues, med check.    Patient is checking home blood sugars.  Checks every other day, once daily Home blood sugar records: 93 TO 149 Current symptoms include: none and BILATERAL KNEE PAIN, STIFF.  Patient is checking their feet daily. Foot concerns (callous, ulcer, wound, thickened nails, toenail fungus, skin fungus, hammer toe): NO CONCERNS Last dilated eye exam 2015  Current treatments: kombiglyze xr Medication compliance: good  Current diet: ON A DIET, trying to eat a healthy diabetic diet. Current exercise: walking Known diabetic complications: none   Taking zocor without c/o.  Taking BP medication without c/o.   Having some knee pains, knee swelling.  Taking ibuprofen sometimes.  Had upper teeth pulled, was fitted for dentures ,but having trouble biting due to pain of upper gum.   Goes back to dentist Friday  The following portions of the patient's history were reviewed and updated as appropriate: allergies, current medications, past family history, past medical history, past social history, past surgical history and problem list.  ROS as in subjective above    Objective:   BP 122/80 mmHg  Pulse 73  Resp 14  Wt 217 lb (98.431 kg)  Wt Readings from Last 3 Encounters:  12/17/14 217 lb (98.431 kg)  03/13/14 214 lb (97.07 kg)  11/06/13 217 lb (98.431 kg)    General appearance: alert, no distress, WD/WN HEENT: normocephalic, sclerae anicteric, TMs pearly, nares patent, no discharge or erythema, pharynx normal Oral cavity: MMM, upper gums edentulous, no obvious abscess or worrisome finding Neck: supple, no lymphadenopathy, no thyromegaly, no masses, no bruits Heart: RRR, normal S1, S2, no murmurs Lungs: CTA bilaterally, no wheezes, rhonchi, or rales Abdomen: +bs, soft, non tender, non distended, no masses, no hepatomegaly, no splenomegaly Pulses:  2+ symmetric, upper and lower extremities, normal cap refill Ext: no edema See separate foot exam bilat knees with bony arthritis changes, no obvious swelling, slight laxity laterally of left knee, rest of legs unremakralbve   Assessment:   Encounter Diagnoses  Name Primary?  . Diabetes mellitus type 2, uncontrolled Yes  . Essential hypertension   . Hyperlipidemia   . Microalbuminuria   . Osteoarthritis of left knee, unspecified osteoarthritis type   . Callus of foot      Plan:   DM type 2, callus of feet - c/t Kombiglyze, we will call with lab results . Advised yearly eye doctor visit, daily foot checks, advised pedicure given the callous area, f/u pending labs HTN - controlled on current medication, Edarbychlor Hyperlipidemia - compliant with zocor, labs today microabuminuria - c/t edarbychlor OA of knees - begin Aspirin 325mg  daily, f/u in 5mo

## 2014-12-18 LAB — MICROALBUMIN / CREATININE URINE RATIO
Creatinine, Urine: 88.4 mg/dL
MICROALB UR: 28.4 mg/dL — AB (ref <2.0)
MICROALB/CREAT RATIO: 321.3 mg/g — AB (ref 0.0–30.0)

## 2014-12-19 NOTE — Progress Notes (Signed)
LM to CB [facility] 

## 2014-12-23 ENCOUNTER — Other Ambulatory Visit: Payer: Self-pay | Admitting: Medical

## 2014-12-23 ENCOUNTER — Other Ambulatory Visit: Payer: Self-pay | Admitting: Family Medicine

## 2014-12-23 DIAGNOSIS — E119 Type 2 diabetes mellitus without complications: Secondary | ICD-10-CM

## 2014-12-23 MED ORDER — DAPAGLIFLOZIN PROPANEDIOL 10 MG PO TABS: 10.0000 mg | ORAL_TABLET | Freq: Every day | ORAL | Status: DC

## 2014-12-26 ENCOUNTER — Telehealth: Payer: Self-pay | Admitting: Medical

## 2014-12-27 NOTE — Telephone Encounter (Signed)
P.A. Wilder Glade approved til 12/26/15. Pt informed and faxed pharmacy approval & discount card to use, pt should get for free.

## 2015-01-10 ENCOUNTER — Other Ambulatory Visit: Payer: Self-pay | Admitting: Medical

## 2015-01-18 ENCOUNTER — Encounter: Payer: 59 | Attending: Medical

## 2015-01-18 VITALS — Ht 68.0 in | Wt 217.6 lb

## 2015-01-18 DIAGNOSIS — E119 Type 2 diabetes mellitus without complications: Secondary | ICD-10-CM | POA: Insufficient documentation

## 2015-01-18 DIAGNOSIS — Z713 Dietary counseling and surveillance: Secondary | ICD-10-CM | POA: Diagnosis not present

## 2015-01-21 NOTE — Progress Notes (Signed)
Patient was seen on 01/18/15 for the complete diabetes self-management series at the [facility]. This is a part of the Link to [employer].  Handouts given during class include:  Living Well with Diabetes book  Carb Counting and Meal Planning book  Meal Plan Card  Carbohydrate guide  Meal planning worksheet  Low Sodium Flavoring Tips  The diabetes portion plate  Low Carbohydrate Snack Suggestions  A1c to eAG Conversion Chart  Diabetes Medications  Stress Management  Diabetes Recommended Care Schedule  Diabetes Success Plan  Core Class Satisfaction Survey  The following learning objectives were met by the patient during this course:  Describe diabetes  State some common risk factors for diabetes  Defines the role of glucose and insulin  Identifies type of diabetes and pathophysiology  Describe the relationship between diabetes and cardiovascular risk  State the members of the Healthcare Team  States the rationale for glucose monitoring  State when to test glucose  State their individual Target Range  State the importance of logging glucose readings  Describe how to interpret glucose readings  Identifies A1C target  Explain the correlation between A1c and eAG values  State symptoms and treatment of high blood glucose  State symptoms and treatment of low blood glucose  Explain proper technique for glucose testing  Identifies proper sharps disposal  Describe the role of different macronutrients on glucose  Explain how carbohydrates affect blood glucose  State what foods contain the most carbohydrates  Demonstrate carbohydrate counting  Demonstrate how to read Nutrition Facts food label  Describe effects of various fats on heart health  Describe the importance of good nutrition for health and healthy eating strategies  Describe techniques for managing your shopping, cooking and meal planning  List  strategies to follow meal plan when dining out  Describe the effects of alcohol on glucose and how to use it safely . State the amount of activity recommended for healthy living . Describe activities suitable for individual needs . Identify ways to regularly incorporate activity into daily life . Identify barriers to activity and ways to over come these barriers  Identify diabetes medications being personally used and their primary action for lowering glucose and possible side effects . Describe role of stress on blood glucose and develop strategies to address psychosocial issues . Identify diabetes complications and ways to prevent them  Explain how to manage diabetes during illness . Evaluate success in meeting personal goal . Establish 2-3 goals that they will plan to diligently work on until they return for the  75-monthfollow-up visit  Goals:   I will count my carb choices at most meals and snacks  I will be active 30 minutes or more 5 times a week  I will take my diabetes medications as scheduled  I will eat less unhealthy fats by eating less "fat"  I will test my glucose at least 2 times a day, 7 days a week  I will look at patterns in my record book at least 2 days a month  To help manage stress I will  Do more activity at least 5 times a week  Your patient has identified these potential barriers to change:  Motivation  Your patient has identified their diabetes self-care support plan as  [facility]  Plan[facility]

## 2015-02-19 ENCOUNTER — Telehealth: Payer: Self-pay | Admitting: Medical

## 2015-02-19 NOTE — Telephone Encounter (Signed)
Called insurance regarding P.A. Edarbyclor, they states no P.A. Required. Called pharmacy & went thru ins for $26, pt already picked up

## 2015-02-24 ENCOUNTER — Other Ambulatory Visit: Payer: Self-pay | Admitting: Medical

## 2015-03-11 ENCOUNTER — Other Ambulatory Visit: Payer: Self-pay | Admitting: Medical

## 2015-04-16 ENCOUNTER — Telehealth: Payer: Self-pay | Admitting: Medical

## 2015-04-16 NOTE — Telephone Encounter (Signed)
Pt called regarding her med FARXIGA 10MG . Wanted a refill and to see if she needed to continue taking it

## 2015-04-17 ENCOUNTER — Other Ambulatory Visit: Payer: Self-pay | Admitting: Medical

## 2015-04-17 MED ORDER — DAPAGLIFLOZIN PROPANEDIOL 10 MG PO TABS: 10.0000 mg | ORAL_TABLET | Freq: Every day | ORAL | Status: DC

## 2015-04-17 NOTE — Telephone Encounter (Signed)
Refilled, and she is due at this time for recheck/med check fasting

## 2015-05-15 ENCOUNTER — Other Ambulatory Visit: Payer: Self-pay | Admitting: Medical

## 2015-09-16 MED FILL — SIMVASTATIN 20 MG TABLET: 20 | 90 days supply | Qty: 90 | Fill #2

## 2015-09-16 MED FILL — FARXIGA 10 MG TABLET: 10 | 30 days supply | Qty: 30 | Fill #5

## 2015-09-16 MED FILL — EDARBYCLOR 40-25 MG TABLET: 40-25 | 30 days supply | Qty: 30 | Fill #3

## 2015-10-15 MED FILL — FARXIGA 10 MG TABLET: 10 | 30 days supply | Qty: 30 | Fill #6

## 2015-10-22 ENCOUNTER — Other Ambulatory Visit: Payer: Self-pay | Admitting: Medical

## 2015-10-22 MED FILL — EDARBYCLOR 40-25 MG TABLET: 40-25 | 30 days supply | Qty: 30 | Fill #0

## 2015-11-14 MED FILL — FARXIGA 10 MG TABLET: 10 | 30 days supply | Qty: 30 | Fill #7

## 2015-11-18 MED FILL — KOMBIGLYZE XR 5-1,000 MG TA: 5-1000 | 90 days supply | Qty: 90 | Fill #3

## 2015-11-25 MED FILL — EDARBYCLOR 40-25 MG TABLET: 40-25 | 30 days supply | Qty: 30 | Fill #1

## 2015-12-12 MED FILL — FARXIGA 10 MG TABLET: 10 | 30 days supply | Qty: 30 | Fill #8

## 2015-12-16 ENCOUNTER — Encounter: Payer: 59 | Admitting: Medical

## 2016-01-02 MED FILL — EDARBYCLOR 40-25 MG TABLET: 40-25 | 30 days supply | Qty: 30 | Fill #2

## 2016-01-13 MED FILL — FARXIGA 10 MG TABLET: 10 | 30 days supply | Qty: 30 | Fill #9

## 2016-01-13 MED FILL — SIMVASTATIN 20 MG TABLET: 20 | 90 days supply | Qty: 90 | Fill #3

## 2016-01-30 MED FILL — EDARBYCLOR 40-25 MG TABLET: 40-25 | 30 days supply | Qty: 30 | Fill #3

## 2016-02-13 ENCOUNTER — Other Ambulatory Visit: Payer: Self-pay | Admitting: Medical

## 2016-02-13 MED FILL — KOMBIGLYZE XR 5-1,000 MG TA: 5-1000 | 90 days supply | Qty: 90 | Fill #0

## 2016-02-13 MED FILL — FARXIGA 10 MG TABLET: 10 | 30 days supply | Qty: 30 | Fill #10

## 2016-03-04 ENCOUNTER — Other Ambulatory Visit: Payer: Self-pay | Admitting: Family Medicine

## 2016-03-04 NOTE — Telephone Encounter (Signed)
I have tried to call pt no answer on phone she had an appointment 12/16/15 and she canceled the appointment she has not been in for a while and has been told she needs appointment so I will Dennie med .

## 2016-03-17 MED FILL — FARXIGA 10 MG TABLET: 10 | 30 days supply | Qty: 30 | Fill #11

## 2016-05-14 DIAGNOSIS — I4891 Unspecified atrial fibrillation: Secondary | ICD-10-CM

## 2016-05-14 HISTORY — DX: Unspecified atrial fibrillation: I48.91

## 2016-05-24 ENCOUNTER — Telehealth: Payer: Self-pay | Admitting: Medical

## 2016-05-24 ENCOUNTER — Ambulatory Visit (INDEPENDENT_AMBULATORY_CARE_PROVIDER_SITE_OTHER): Payer: 59 | Admitting: Medical

## 2016-05-24 ENCOUNTER — Encounter: Payer: Self-pay | Admitting: Medical

## 2016-05-24 VITALS — BP 150/88 | HR 84 | Resp 18 | Ht 68.0 in | Wt 218.2 lb

## 2016-05-24 DIAGNOSIS — E119 Type 2 diabetes mellitus without complications: Secondary | ICD-10-CM

## 2016-05-24 DIAGNOSIS — E785 Hyperlipidemia, unspecified: Secondary | ICD-10-CM | POA: Diagnosis not present

## 2016-05-24 DIAGNOSIS — I10 Essential (primary) hypertension: Secondary | ICD-10-CM

## 2016-05-24 DIAGNOSIS — R809 Proteinuria, unspecified: Secondary | ICD-10-CM | POA: Diagnosis not present

## 2016-05-24 DIAGNOSIS — R002 Palpitations: Secondary | ICD-10-CM | POA: Diagnosis not present

## 2016-05-24 DIAGNOSIS — E118 Type 2 diabetes mellitus with unspecified complications: Secondary | ICD-10-CM

## 2016-05-24 DIAGNOSIS — I4891 Unspecified atrial fibrillation: Secondary | ICD-10-CM | POA: Diagnosis not present

## 2016-05-24 DIAGNOSIS — Z23 Encounter for immunization: Secondary | ICD-10-CM | POA: Diagnosis not present

## 2016-05-24 LAB — CBC
HEMATOCRIT: 40.7 % (ref 35.0–45.0)
HEMOGLOBIN: 13.6 g/dL (ref 11.7–15.5)
MCH: 26.1 pg — AB (ref 27.0–33.0)
MCHC: 33.4 g/dL (ref 32.0–36.0)
MCV: 78 fL — ABNORMAL LOW (ref 80.0–100.0)
MPV: 9.7 fL (ref 7.5–12.5)
Platelets: 331 10*3/uL (ref 140–400)
RBC: 5.22 MIL/uL — ABNORMAL HIGH (ref 3.80–5.10)
RDW: 14.5 % (ref 11.0–15.0)
WBC: 7.4 10*3/uL (ref 4.0–10.5)

## 2016-05-24 LAB — LIPID PANEL
CHOL/HDL RATIO: 2.1 ratio (ref <=5.0)
Cholesterol: 200 mg/dL (ref 125–200)
HDL: 97 mg/dL (ref >=46)
LDL CALC: 88 mg/dL (ref <130)
TRIGLYCERIDES: 74 mg/dL (ref <150)
VLDL: 15 mg/dL (ref <30)

## 2016-05-24 LAB — COMPREHENSIVE METABOLIC PANEL
ALBUMIN: 4 g/dL (ref 3.6–5.1)
ALK PHOS: 92 U/L (ref 33–130)
ALT: 16 U/L (ref 6–29)
AST: 13 U/L (ref 10–35)
BILIRUBIN TOTAL: 0.6 mg/dL (ref 0.2–1.2)
BUN: 15 mg/dL (ref 7–25)
CALCIUM: 10.1 mg/dL (ref 8.6–10.4)
CO2: 23 mmol/L (ref 20–31)
Chloride: 107 mmol/L (ref 98–110)
Creat: 0.82 mg/dL (ref 0.50–0.99)
GLUCOSE: 142 mg/dL — AB (ref 65–99)
POTASSIUM: 4 mmol/L (ref 3.5–5.3)
Sodium: 142 mmol/L (ref 135–146)
Total Protein: 7.1 g/dL (ref 6.1–8.1)

## 2016-05-24 LAB — POCT GLYCOSYLATED HEMOGLOBIN (HGB A1C): Hemoglobin A1C: 7.1

## 2016-05-24 MED ORDER — AZILSARTAN-CHLORTHALIDONE 40-12.5 MG PO TABS: 1.0000 | ORAL_TABLET | Freq: Every day | ORAL | 2 refills | Status: DC

## 2016-05-24 MED ORDER — DILTIAZEM HCL ER COATED BEADS 120 MG PO CP24
120.0000 mg | ORAL_CAPSULE | Freq: Every day | ORAL | 2 refills | Status: DC
Start: 2016-05-24 — End: 2016-08-18

## 2016-05-24 MED ORDER — RIVAROXABAN 20 MG PO TABS: 20.0000 mg | ORAL_TABLET | Freq: Every day | ORAL | 2 refills | Status: DC

## 2016-05-24 MED ORDER — SAXAGLIPTIN-METFORMIN ER 5-1000 MG PO TB24: 1.0000 | ORAL_TABLET | Freq: Every day | ORAL | 0 refills | Status: DC

## 2016-05-24 MED FILL — CARTIA XT 120 MG CAPSULE SA: 120 | 30 days supply | Qty: 30 | Fill #0

## 2016-05-24 MED FILL — KOMBIGLYZE XR 5-1,000 MG TA: 5-1000 | 90 days supply | Qty: 90 | Fill #0

## 2016-05-24 MED FILL — EDARBYCLOR 40-12.5 MG TAB: 40-12.5 | 30 days supply | Qty: 30 | Fill #0

## 2016-05-24 MED FILL — XARELTO 20 MG TABLET: 20 | 30 days supply | Qty: 30 | Fill #0

## 2016-05-24 NOTE — Telephone Encounter (Signed)
Refer to [facility], preferably within 1 week.

## 2016-05-24 NOTE — Progress Notes (Signed)
Subjective:   Kathy [name] is an 60 y.o. female who presents for follow up of Type 2 diabetes mellitus and chronic issues, med check.     She notes lately getting knee pains, stiffness and pains in knees, gets pains in arms.     She notes that the BP medication from last visit was causing palpitations in chest, so she ended up stopping Edarbychlor.   Stopped this months ago  Diabetes - chest sugars some, nothing over 130.   Had been compliant with medication but ran out of medication, farxiga and Kombiglyze medications ran out a week ago.   Last visit 12/2014.   She notes that she thought she just need to come in yearly.     Compliant with zocor. No c/o.  No foot concerns, checks feet.   Eating healthy, walks a few days per week for exercise.    The following portions of the patient's history were reviewed and updated as appropriate: allergies, current medications, past family history, past medical history, past social history, past surgical history and problem list.  ROS as in subjective above  Past Medical History:  Diagnosis Date  . Diabetes mellitus without complication ([place])    type II  . History of Helicobacter pylori infection   . Hyperlipidemia   . Hypertension    Family History  Problem Relation Age of Onset  . Stroke Mother   . Cancer Father     throat  . Diabetes Sister   . Diabetes Sister   . Diabetes Sister   . Cancer Sister     breast  . Heart disease Neg Hx    No past surgical history on file.  ROS as in subjective   Objective:   BP (!) 150/88 (BP Location: Left Arm, Patient Position: Sitting, Cuff Size: Normal)   Pulse 84   Resp 18   Ht 5\' 8"  (1.727 m)   Wt 218 lb 3.2 oz (99 kg)   BMI 33.18 kg/m   Wt Readings from Last 3 Encounters:  05/24/16 218 lb 3.2 oz (99 kg)  01/21/15 217 lb 9.6 oz (98.7 kg)  12/17/14 217 lb (98.4 kg)   BP Readings from Last 3 Encounters:  05/24/16 (!) 150/88  12/17/14 122/80  03/13/14 130/80   General  appearance: alert, no distress, WD/WN, AA female HEENT: normocephalic, sclerae anicteric, TMs pearly, nares patent, no discharge or erythema, pharynx normal Oral cavity: MMM, upper gums edentulous, no obvious abscess or worrisome finding Neck: supple, no lymphadenopathy, no thyromegaly, no masses, no bruits Heart: irregular, no murmurs Lungs: CTA bilaterally, no wheezes, rhonchi, or rales Pulses: 2+ symmetric, upper, and 1+ symmetric lower extremities, normal cap refill Ext: no edema See separate foot exam bilat knees with bony arthritis changes, no obvious swelling, slight laxity laterally of left knee, rest of legs unremarkable  Diabetic Foot Exam - Simple   Simple Foot Form Diabetic Foot exam was performed with the following findings:  Yes 05/24/2016 12:33 PM  Visual Inspection No deformities, no ulcerations, no other skin breakdown bilaterally:  Yes Sensation Testing Intact to touch and monofilament testing bilaterally:  Yes Pulse Check See comments:  Yes Comments 1+ pedal pulses, slight 1+ nonpitting edema       Adult ECG Report  Indication: palpitations  Rate: 118 bpm  Rhythm: atrial fibrillation  QRS Axis: 32 degrees  PR Interval: ?  QRS Duration: 90 ms  QTc: 374ms  Conduction Disturbances: none  Other Abnormalities: none  Patient's cardiac risk factors  are: diabetes mellitus, dyslipidemia and hypertension.  EKG comparison: none  Narrative Interpretation: new onset Afib     Assessment:   Encounter Diagnoses  Name Primary?  . Type 2 diabetes mellitus with complication, without long-term current use of insulin ([place]) Yes  . Atrial fibrillation, unspecified type ([place])   . Essential hypertension, benign   . Hyperlipidemia   . Microalbuminuria   . Need for prophylactic vaccination and inoculation against influenza   . Palpitations   . Diabetes mellitus without complication ([place])      Plan:   DM type 2 - labs today, advised yearly eye doctor visit, daily  foot checks.  F/u pending labs afib - new onset.  discussed diagnosis, symptoms, exam findings, possible complications, possible therapies.   Begin trial of Cardizem.   She is CHADs2 score of 2.   Given her risks, and after discussion with Dr. Redmond School today, begin Xarelto.  We discussed other options as well.   HTN - she stopped Edarbychlor on her own.  Restart lower dose of Edarbychlor.   Hyperlipidemia - compliant with zocor, labs today Microabuminuria - f/u pending labs Counseled on the influenza virus vaccine.  Vaccine information sheet given.  Influenza vaccine given after consent obtained. Advised she check insurance coverage for shingles vaccine. She is past due on physical, cancer screening.  We did not get to discuss this, but I did reiterate the need to come in soon for physical and to come in at least quarterly for routine visits  Kathy [name] was seen today for diabetes.  Diagnoses and all orders for this visit:  Type 2 diabetes mellitus with complication, without long-term current use of insulin ([facility]) -     Comprehensive metabolic panel -     Lipid panel -     CBC -     TSH -     HM DIABETES FOOT EXAM -     HM DIABETES EYE EXAM -     Microalbumin / creatinine urine ratio -     EKG 12-Lead  Atrial fibrillation, unspecified type ([place]) -     Ambulatory referral to Cardiology  Essential hypertension, benign -     Comprehensive metabolic panel -     EKG 57-SVXB  Hyperlipidemia -     Comprehensive metabolic panel -     Lipid panel  Microalbuminuria  Need for prophylactic vaccination and inoculation against influenza -     Flu Vaccine QUAD 36+ mos PF IM (Fluarix & Fluzone Quad PF)  Palpitations -     EKG 12-Lead  Diabetes mellitus without complication ([facility]) Comments: diagnosis in error Orders: -     HgB A1c  Other orders -     diltiazem (CARDIZEM CD) 120 MG 24 hr capsule; Take 1 capsule (120 mg total) by mouth daily. -     rivaroxaban (XARELTO) 20 MG TABS tablet;  Take 1 tablet (20 mg total) by mouth daily with supper. -     Saxagliptin-Metformin (KOMBIGLYZE XR) 01-999 MG TB24; Take 1 tablet by mouth daily. -     Azilsartan-Chlorthalidone 40-12.5 MG TABS; Take 1 tablet by mouth daily.

## 2016-05-24 NOTE — Telephone Encounter (Signed)
Appt is scheduled 05/26/16 @ 9:00AM. Pt is made aware of location and phone number. Victorino December

## 2016-05-24 NOTE — Patient Instructions (Signed)
Recommendations;  You have a new diagnosis today of Atrial Fibrillation  Begin Cardizem daily to control the blood pressure and heart ry tm  Begin Xarelto 20mg  once daily to reduce risk of blood clot and stroke  We will continue Edarbychlor but at a lower dose. This is your previous blood pressure medication  Once I get your labs back we will call back about the rest of the medications  We will refer you to cardiology  Please check insurance coverage for shingles vaccine  See your dentist yearly for routine dental care including hygiene visits twice yearly.  See your eye doctor yearly for routine vision care.  I do recommend a physical within the next few months to update mammogram, screening, routine preventative care.     Atrial Fibrillation Atrial fibrillation is a type of irregular or rapid heartbeat (arrhythmia). In atrial fibrillation, the heart quivers continuously in a chaotic pattern. This occurs when parts of the heart receive disorganized signals that make the heart unable to pump blood normally. This can increase the risk for stroke, heart failure, and other heart-related conditions. There are different types of atrial fibrillation, including:  Paroxysmal atrial fibrillation. This type starts suddenly, and it usually stops on its own shortly after it starts.  Persistent atrial fibrillation. This type often lasts longer than a week. It may stop on its own or with treatment.  Long-lasting persistent atrial fibrillation. This type lasts longer than 12 months.  Permanent atrial fibrillation. This type does not go away. Talk with your health care provider to learn about the type of atrial fibrillation that you have. CAUSES This condition is caused by some heart-related conditions or procedures, including:  A heart attack.  Coronary artery disease.  Heart failure.  Heart valve conditions.  High blood pressure.  Inflammation of the sac that surrounds the heart  (pericarditis).  Heart surgery.  Certain heart rhythm disorders, such as Wolf-Parkinson-White syndrome. Other causes include:  Pneumonia.  Obstructive sleep apnea.  Blockage of an artery in the lungs (pulmonary embolism, or PE).  Lung cancer.  Chronic lung disease.  Thyroid problems, especially if the thyroid is overactive (hyperthyroidism).  Caffeine.  Excessive alcohol use or illegal drug use.  Use of some medicines, including certain decongestants and diet pills. Sometimes, the cause cannot be found. RISK FACTORS This condition is more likely to develop in:  People who are older in age.  People who smoke.  People who have diabetes mellitus.  People who are overweight (obese).  Athletes who exercise vigorously. SYMPTOMS Symptoms of this condition include:  A feeling that your heart is beating rapidly or irregularly.  A feeling of discomfort or pain in your chest.  Shortness of breath.  Sudden light-headedness or weakness.  Getting tired easily during exercise. In some cases, there are no symptoms. DIAGNOSIS Your health care provider may be able to detect atrial fibrillation when taking your pulse. If detected, this condition may be diagnosed with:  An electrocardiogram (ECG).  A Holter monitor test that records your heartbeat patterns over a 24-hour period.  Transthoracic echocardiogram (TTE) to evaluate how blood flows through your heart.  Transesophageal echocardiogram (TEE) to view more detailed images of your heart.  A stress test.  Imaging tests, such as a CT scan or chest X-ray.  Blood tests. TREATMENT The main goals of treatment are to prevent blood clots from forming and to keep your heart beating at a normal rate and rhythm. The type of treatment that you receive depends on  many factors, such as your underlying medical conditions and how you feel when you are experiencing atrial fibrillation. This condition may be treated  with:  Medicine to slow down the heart rate, bring the heart's rhythm back to normal, or prevent clots from forming.  Electrical cardioversion. This is a procedure that resets your heart's rhythm by delivering a controlled, low-energy shock to the heart through your skin.  Different types of ablation, such as catheter ablation, catheter ablation with pacemaker, or surgical ablation. These procedures destroy the heart tissues that send abnormal signals. When the pacemaker is used, it is placed under your skin to help your heart beat in a regular rhythm. HOME CARE INSTRUCTIONS  Take over-the counter and prescription medicines only as told by your health care provider.  If your health care provider prescribed a blood-thinning medicine (anticoagulant), take it exactly as told. Taking too much blood-thinning medicine can cause bleeding. If you do not take enough blood-thinning medicine, you will not have the protection that you need against stroke and other problems.  Do not use tobacco products, including cigarettes, chewing tobacco, and e-cigarettes. If you need help quitting, ask your health care provider.  If you have obstructive sleep apnea, manage your condition as told by your health care provider.  Do not drink alcohol.  Do not drink beverages that contain caffeine, such as coffee, soda, and tea.  Maintain a healthy weight. Do not use diet pills unless your health care provider approves. Diet pills may make heart problems worse.  Follow diet instructions as told by your health care provider.  Exercise regularly as told by your health care provider.  Keep all follow-up visits as told by your health care provider. This is important. PREVENTION  Avoid drinking beverages that contain caffeine or alcohol.  Avoid certain medicines, especially medicines that are used for breathing problems.  Avoid certain herbs and herbal medicines, such as those that contain ephedra or ginseng.  Do  not use illegal drugs, such as cocaine and amphetamines.  Do not smoke.  Manage your high blood pressure. SEEK MEDICAL CARE IF:  You notice a change in the rate, rhythm, or strength of your heartbeat.  You are taking an anticoagulant and you notice increased bruising.  You tire more easily when you exercise or exert yourself. SEEK IMMEDIATE MEDICAL CARE IF:  You have chest pain, abdominal pain, sweating, or weakness.  You feel nauseous.  You notice blood in your vomit, bowel movement, or urine.  You have shortness of breath.  You suddenly have swollen feet and ankles.  You feel dizzy.  You have sudden weakness or numbness of the face, arm, or leg, especially on one side of the body.  You have trouble speaking, trouble understanding, or both (aphasia).  Your face or your eyelid droops on one side. These symptoms may represent a serious problem that is an emergency. Do not wait to see if the symptoms will go away. Get medical help right away. Call your local emergency services (911 in the [place] Do not drive yourself to the hospital.   This information is not intended to replace advice given to you by your health care provider. Make sure you discuss any questions you have with your health care provider.   Document Released: 08/30/2005 Document Revised: 05/21/2015 Document Reviewed: 12/25/2014 Elsevier Interactive Patient Education 2016 Elsevier Inc.    Rivaroxaban oral tablets What is this medicine? RIVAROXABAN (ri va ROX a ban) is an anticoagulant (blood thinner). It is used to  treat blood clots in the lungs or in the veins. It is also used after knee or hip surgeries to prevent blood clots. It is also used to lower the chance of stroke in people with a medical condition called atrial fibrillation. This medicine may be used for other purposes; ask your health care provider or pharmacist if you have questions. What should I tell my health care provider before I take this  medicine? They need to know if you have any of these conditions: -bleeding disorders -bleeding in the brain -blood in your stools (black or tarry stools) or if you have blood in your vomit -history of stomach bleeding -kidney disease -liver disease -low blood counts, like low white cell, platelet, or red cell counts -recent or planned spinal or epidural procedure -take medicines that treat or prevent blood clots -an unusual or allergic reaction to rivaroxaban, other medicines, foods, dyes, or preservatives -pregnant or trying to get pregnant -breast-feeding How should I use this medicine? Take this medicine by mouth with a glass of water. Follow the directions on the prescription label. Take your medicine at regular intervals. Do not take it more often than directed. Do not stop taking except on your doctor's advice. Stopping this medicine may increase your risk of a blood clot. Be sure to refill your prescription before you run out of medicine. If you are taking this medicine after hip or knee replacement surgery, take it with or without food. If you are taking this medicine for atrial fibrillation, take it with your evening meal. If you are taking this medicine to treat blood clots, take it with food at the same time each day. If you are unable to swallow your tablet, you may crush the tablet and mix it in applesauce. Then, immediately eat the applesauce. You should eat more food right after you eat the applesauce containing the crushed tablet. Talk to your pediatrician regarding the use of this medicine in children. Special care may be needed. Overdosage: If you think you have taken too much of this medicine contact a poison control center or emergency room at once. NOTE: This medicine is only for you. Do not share this medicine with others. What if I miss a dose? If you take your medicine once a day and miss a dose, take the missed dose as soon as you remember. If you take your medicine  twice a day and miss a dose, take the missed dose immediately. In this instance, 2 tablets may be taken at the same time. The next day you should take 1 tablet twice a day as directed. What may interact with this medicine? -aspirin and aspirin-like medicines -certain antibiotics like erythromycin, azithromycin, and clarithromycin -certain medicines for fungal infections like ketoconazole and itraconazole -certain medicines for irregular heart beat like amiodarone, quinidine, dronedarone -certain medicines for seizures like carbamazepine, phenytoin -certain medicines that treat or prevent blood clots like warfarin, enoxaparin, and dalteparin -conivaptan -diltiazem -felodipine -indinavir -lopinavir; ritonavir -NSAIDS, medicines for pain and inflammation, like ibuprofen or naproxen -ranolazine -rifampin -ritonavir -St. John's wort -verapamil This list may not describe all possible interactions. Give your health care provider a list of all the medicines, herbs, non-prescription drugs, or dietary supplements you use. Also tell them if you smoke, drink alcohol, or use illegal drugs. Some items may interact with your medicine. What should I watch for while using this medicine? Visit your doctor or health care professional for regular checks on your progress. Your condition will be monitored carefully while  you are receiving this medicine. Notify your doctor or health care professional and seek emergency treatment if you develop breathing problems; changes in vision; chest pain; severe, sudden headache; pain, swelling, warmth in the leg; trouble speaking; sudden numbness or weakness of the face, arm, or leg. These can be signs that your condition has gotten worse. If you are going to have surgery, tell your doctor or health care professional that you are taking this medicine. Tell your health care professional that you use this medicine before you have a spinal or epidural procedure. Sometimes people  who take this medicine have bleeding problems around the spine when they have a spinal or epidural procedure. This bleeding is very rare. If you have a spinal or epidural procedure while on this medicine, call your health care professional immediately if you have back pain, numbness or tingling (especially in your legs and feet), muscle weakness, paralysis, or loss of bladder or bowel control. Avoid sports and activities that might cause injury while you are using this medicine. Severe falls or injuries can cause unseen bleeding. Be careful when using sharp tools or knives. Consider using an Copy. Take special care brushing or flossing your teeth. Report any injuries, bruising, or red spots on the skin to your doctor or health care professional. What side effects may I notice from receiving this medicine? Side effects that you should report to your doctor or health care professional as soon as possible: -allergic reactions like skin rash, itching or hives, swelling of the face, lips, or tongue -back pain -redness, blistering, peeling or loosening of the skin, including inside the mouth -signs and symptoms of bleeding such as bloody or black, tarry stools; red or dark-brown urine; spitting up blood or brown material that looks like coffee grounds; red spots on the skin; unusual bruising or bleeding from the eye, gums, or nose Side effects that usually do not require medical attention (Report these to your doctor or health care professional if they continue or are bothersome.): -dizziness -muscle pain This list may not describe all possible side effects. Call your doctor for medical advice about side effects. You may report side effects to FDA at 1-800-FDA-1088. Where should I keep my medicine? Keep out of the reach of children. Store at room temperature between 15 and 30 degrees C (59 and 86 degrees F). Throw away any unused medicine after the expiration date. NOTE: This sheet is a summary.  It may not cover all possible information. If you have questions about this medicine, talk to your doctor, pharmacist, or health care provider.    2016, Elsevier/Gold Standard. (2014-08-28 12:45:34)

## 2016-05-25 ENCOUNTER — Telehealth: Payer: Self-pay

## 2016-05-25 ENCOUNTER — Other Ambulatory Visit: Payer: Self-pay | Admitting: Medical

## 2016-05-25 LAB — MICROALBUMIN / CREATININE URINE RATIO
Creatinine, Urine: 15 mg/dL — ABNORMAL LOW (ref 20–320)
Microalb Creat Ratio: 6533 mcg/mg creat — ABNORMAL HIGH (ref <30)
Microalb, Ur: 98 mg/dL

## 2016-05-25 LAB — TSH: TSH: 1.42 mIU/L

## 2016-05-25 MED ORDER — SAXAGLIPTIN-METFORMIN ER 5-1000 MG PO TB24: 1.0000 | ORAL_TABLET | Freq: Every day | ORAL | 0 refills | Status: DC

## 2016-05-25 MED ORDER — SIMVASTATIN 20 MG PO TABS: 20.0000 mg | ORAL_TABLET | Freq: Every day | ORAL | 1 refills | Status: DC

## 2016-05-25 MED FILL — SIMVASTATIN 20 MG TABLET: 20 | 90 days supply | Qty: 90 | Fill #0

## 2016-05-25 NOTE — Telephone Encounter (Signed)
Fax rcvd in office from Manuela Schwartz at [facility] in regards to Cardizem and Simvastatin. Pt has been prescribed these two medications and there is an interaction noted that pt should not have any more than 10 mg of Simvastatin with Cardizem.   Pt has been notified to decrease Simvastatin to 1/2 tablet daily. Per Audelia Acton.  Pt has appt with a fibb clinic tomorrow. Victorino December

## 2016-05-26 ENCOUNTER — Encounter (HOSPITAL_COMMUNITY): Payer: Self-pay | Admitting: Nurse Practitioner

## 2016-05-26 ENCOUNTER — Ambulatory Visit (HOSPITAL_COMMUNITY)
Admission: RE | Admit: 2016-05-26 | Discharge: 2016-05-26 | Disposition: A | Discharge status: Home / Self Care | Payer: 59 | Source: Ambulatory Visit | Attending: Nurse Practitioner | Admitting: Nurse Practitioner

## 2016-05-26 VITALS — BP 138/80 | HR 71 | Ht 68.0 in | Wt 216.2 lb

## 2016-05-26 DIAGNOSIS — Z823 Family history of stroke: Secondary | ICD-10-CM | POA: Insufficient documentation

## 2016-05-26 DIAGNOSIS — I4891 Unspecified atrial fibrillation: Secondary | ICD-10-CM | POA: Insufficient documentation

## 2016-05-26 DIAGNOSIS — Z7984 Long term (current) use of oral hypoglycemic drugs: Secondary | ICD-10-CM | POA: Diagnosis not present

## 2016-05-26 DIAGNOSIS — Z833 Family history of diabetes mellitus: Secondary | ICD-10-CM | POA: Diagnosis not present

## 2016-05-26 DIAGNOSIS — E119 Type 2 diabetes mellitus without complications: Secondary | ICD-10-CM | POA: Diagnosis not present

## 2016-05-26 DIAGNOSIS — Z79899 Other long term (current) drug therapy: Secondary | ICD-10-CM | POA: Insufficient documentation

## 2016-05-26 DIAGNOSIS — I480 Paroxysmal atrial fibrillation: Secondary | ICD-10-CM | POA: Diagnosis not present

## 2016-05-26 DIAGNOSIS — Z7901 Long term (current) use of anticoagulants: Secondary | ICD-10-CM | POA: Insufficient documentation

## 2016-05-26 DIAGNOSIS — E785 Hyperlipidemia, unspecified: Secondary | ICD-10-CM | POA: Insufficient documentation

## 2016-05-26 DIAGNOSIS — I10 Essential (primary) hypertension: Secondary | ICD-10-CM | POA: Diagnosis not present

## 2016-05-26 MED ORDER — DILTIAZEM HCL 30 MG PO TABS: ORAL_TABLET | ORAL | 1 refills | Status: DC

## 2016-05-26 MED FILL — dilTIAZem HCL 30 MG TABS: 30 | 7 days supply | Qty: 45 | Fill #0

## 2016-05-26 NOTE — Patient Instructions (Signed)
Your physician has recommended you make the following change in your medication:   1)Cardizem 30mg  -- take 1 tablet every 4 hours AS NEEDED for heart rate >100 as long as blood pressure >100.

## 2016-05-26 NOTE — Progress Notes (Signed)
Primary Care Physician: Crisoforo Oxford, PA-C Referring Physician: Same   Kathy [name] is a 60 y.o. female with a h/o HTN, DM, that started noticing palpitations a few weeks ago. This coincided with starting high potency vitamins with energy. She was having palpitations on 9/11 when she had her PCP appointment and afib was present on EKG. She was started on cardizem CD 120 mg and xarelto 20 mg daily for CHA2DS2VASc score of 3(htn, female, DM). No bleeding history. Creat cl cal at 112 ml/min. She is in SR today. No snoring,no alcohol or tobacco. Minimal caffeine intake. Does walk 20 mins bid. Labs were checked including TSH, CBC, HGB A1c, lipid and without significant abnormalities.   Today, she denies symptoms of palpitations, chest pain, shortness of breath, orthopnea, PND, lower extremity edema, dizziness, presyncope, syncope, or neurologic sequela. The patient is tolerating medications without difficulties and is otherwise without complaint today.   Past Medical History:  Diagnosis Date  . Diabetes mellitus without complication ([place])    type II  . History of Helicobacter pylori infection   . Hyperlipidemia   . Hypertension    No past surgical history on file.  Current Outpatient Prescriptions  Medication Sig Dispense Refill  . Azilsartan-Chlorthalidone 40-12.5 MG TABS Take 1 tablet by mouth daily. 30 tablet 2  . diltiazem (CARDIZEM CD) 120 MG 24 hr capsule Take 1 capsule (120 mg total) by mouth daily. 30 capsule 2  . rivaroxaban (XARELTO) 20 MG TABS tablet Take 1 tablet (20 mg total) by mouth daily with supper. 30 tablet 2  . Saxagliptin-Metformin (KOMBIGLYZE XR) 01-999 MG TB24 Take 1 tablet by mouth daily. 90 tablet 0  . simvastatin (ZOCOR) 20 MG tablet Take 10 mg by mouth daily.    Marland Kitchen diltiazem (CARDIZEM) 30 MG tablet Cardizem 30mg  -- take 1 tablet every 4 hours AS NEEDED for heart rate >100 as long as blood pressure >100. 45 tablet 1   No current  facility-administered medications for this encounter.     No Known Allergies  Social History   Social History  . Marital status: Married    Spouse name: N/A  . Number of children: N/A  . Years of education: N/A   Occupational History  . Not on file.   Social History Main Topics  . Smoking status: Never Smoker  . Smokeless tobacco: Not on file  . Alcohol use No  . Drug use: No  . Sexual activity: Not on file   Other Topics Concern  . Not on file   Social History Narrative  . No narrative on file    Family History  Problem Relation Age of Onset  . Stroke Mother   . Cancer Father     throat  . Diabetes Sister   . Diabetes Sister   . Diabetes Sister   . Cancer Sister     breast  . Heart disease Neg Hx     ROS- All systems are reviewed and negative except as per the HPI above  Physical Exam: Vitals:   05/26/16 0908  BP: 138/80  Pulse: 71  Weight: 216 lb 3.2 oz (98.1 kg)  Height: 5\' 8"  (1.727 m)    GEN- The patient is well appearing, alert and oriented x 3 today.   Head- normocephalic, atraumatic Eyes-  Sclera clear, conjunctiva pink Ears- hearing intact Oropharynx- clear Neck- supple, no JVP Lymph- no cervical lymphadenopathy Lungs- Clear to ausculation bilaterally, normal work of breathing Heart- Regular rate and  rhythm, no murmurs, rubs or gallops, PMI not laterally displaced GI- soft, NT, ND, + BS Extremities- no clubbing, cyanosis, or edema MS- no significant deformity or atrophy Skin- no rash or lesion Psych- euthymic mood, full affect Neuro- strength and sensation are intact  EKG- NSR at 71 bpm, pr int 184 ms, qrs int 98 ms, qtc 434 ms Epic records reviewed  Assessment and Plan: 1. New onset afib General education of afib management discussed and written info given Started on appropriate meds Continue cardizem Continue Xarelto 20 mg for CHA2DS2VASc score of 3 Echo ordered  2. Lifestyle issues contributing to afb Pt has stopped high  energy vitamins that she recently started that may have precipitated afib Continue walking program Weight loss efforts encouraged and weight management class offered  3.  Start of Anticoagulants No bleeding history Bleeding precautions given Symptoms to watch for in case of bleeding discussed  F/u in 1-2 weeks after echo is obtained  Butch Penny C. Maddyn Lieurance, [facility] [address] [place], [place] [address] [phone]

## 2016-05-31 ENCOUNTER — Ambulatory Visit (HOSPITAL_COMMUNITY)
Admission: RE | Admit: 2016-05-31 | Discharge: 2016-05-31 | Disposition: A | Discharge status: Home / Self Care | Payer: 59 | Source: Ambulatory Visit | Attending: Nurse Practitioner | Admitting: Nurse Practitioner

## 2016-05-31 DIAGNOSIS — I10 Essential (primary) hypertension: Secondary | ICD-10-CM | POA: Insufficient documentation

## 2016-05-31 DIAGNOSIS — E119 Type 2 diabetes mellitus without complications: Secondary | ICD-10-CM | POA: Diagnosis not present

## 2016-05-31 DIAGNOSIS — E785 Hyperlipidemia, unspecified: Secondary | ICD-10-CM | POA: Diagnosis not present

## 2016-05-31 DIAGNOSIS — I480 Paroxysmal atrial fibrillation: Secondary | ICD-10-CM | POA: Insufficient documentation

## 2016-05-31 NOTE — Progress Notes (Signed)
*  PRELIMINARY RESULTS* Echocardiogram 2D Echocardiogram has been performed.  Leavy Cella 05/31/2016, 11:56 AM

## 2016-06-03 ENCOUNTER — Encounter (HOSPITAL_COMMUNITY): Payer: Self-pay | Admitting: Nurse Practitioner

## 2016-06-03 ENCOUNTER — Ambulatory Visit (HOSPITAL_COMMUNITY)
Admission: RE | Admit: 2016-06-03 | Discharge: 2016-06-03 | Disposition: A | Discharge status: Home / Self Care | Payer: 59 | Source: Ambulatory Visit | Attending: Nurse Practitioner | Admitting: Nurse Practitioner

## 2016-06-03 VITALS — BP 142/84 | HR 76 | Ht 68.0 in | Wt 210.8 lb

## 2016-06-03 DIAGNOSIS — Z803 Family history of malignant neoplasm of breast: Secondary | ICD-10-CM | POA: Diagnosis not present

## 2016-06-03 DIAGNOSIS — Z833 Family history of diabetes mellitus: Secondary | ICD-10-CM | POA: Insufficient documentation

## 2016-06-03 DIAGNOSIS — E785 Hyperlipidemia, unspecified: Secondary | ICD-10-CM | POA: Diagnosis not present

## 2016-06-03 DIAGNOSIS — Z800 Family history of malignant neoplasm of digestive organs: Secondary | ICD-10-CM | POA: Insufficient documentation

## 2016-06-03 DIAGNOSIS — Z8249 Family history of ischemic heart disease and other diseases of the circulatory system: Secondary | ICD-10-CM | POA: Insufficient documentation

## 2016-06-03 DIAGNOSIS — Z7901 Long term (current) use of anticoagulants: Secondary | ICD-10-CM | POA: Insufficient documentation

## 2016-06-03 DIAGNOSIS — I480 Paroxysmal atrial fibrillation: Secondary | ICD-10-CM | POA: Insufficient documentation

## 2016-06-03 DIAGNOSIS — Z7984 Long term (current) use of oral hypoglycemic drugs: Secondary | ICD-10-CM | POA: Insufficient documentation

## 2016-06-03 DIAGNOSIS — Z823 Family history of stroke: Secondary | ICD-10-CM | POA: Diagnosis not present

## 2016-06-03 DIAGNOSIS — Z8619 Personal history of other infectious and parasitic diseases: Secondary | ICD-10-CM | POA: Insufficient documentation

## 2016-06-03 DIAGNOSIS — E119 Type 2 diabetes mellitus without complications: Secondary | ICD-10-CM | POA: Diagnosis not present

## 2016-06-03 DIAGNOSIS — I10 Essential (primary) hypertension: Secondary | ICD-10-CM | POA: Diagnosis not present

## 2016-06-03 NOTE — Progress Notes (Signed)
Primary Care Physician: Crisoforo Oxford, PA-C Referring Physician: Same   Kathy [name] is a 60 y.o. female with a h/o HTN, DM, that started noticing palpitations a few weeks ago. This coincided with starting high potency vitamins with energy. She was having palpitations on 9/11 when she had her PCP appointment and afib was present on EKG. She was started on cardizem CD 120 mg and xarelto 20 mg daily for CHA2DS2VASc score of 3(htn, female, DM). No bleeding history. Creat cl cal at 112 ml/min. She is in SR today. No snoring,no alcohol or tobacco. Minimal caffeine intake. Does walk 20 mins bid. Labs were checked including TSH, CBC, HGB A1c, lipid and without significant abnormalities.  Returns to afib clinic and has not had any further afib. Thinks the combination of coffee, [facility] and vitamins with energy is what brought on the afib. Echo without any significant structural abnormalities.  Today, she denies symptoms of palpitations, chest pain, shortness of breath, orthopnea, PND, lower extremity edema, dizziness, presyncope, syncope, or neurologic sequela. The patient is tolerating medications without difficulties and is otherwise without complaint today.   Past Medical History:  Diagnosis Date  . Diabetes mellitus without complication ([place])    type II  . History of Helicobacter pylori infection   . Hyperlipidemia   . Hypertension    No past surgical history on file.  Current Outpatient Prescriptions  Medication Sig Dispense Refill  . Azilsartan-Chlorthalidone 40-12.5 MG TABS Take 1 tablet by mouth daily. 30 tablet 2  . diltiazem (CARDIZEM CD) 120 MG 24 hr capsule Take 1 capsule (120 mg total) by mouth daily. 30 capsule 2  . diltiazem (CARDIZEM) 30 MG tablet Cardizem 30mg  -- take 1 tablet every 4 hours AS NEEDED for heart rate >100 as long as blood pressure >100. 45 tablet 1  . rivaroxaban (XARELTO) 20 MG TABS tablet Take 1 tablet (20 mg total) by mouth daily with  supper. 30 tablet 2  . Saxagliptin-Metformin (KOMBIGLYZE XR) 01-999 MG TB24 Take 1 tablet by mouth daily. 90 tablet 0  . simvastatin (ZOCOR) 20 MG tablet Take 10 mg by mouth daily.     No current facility-administered medications for this encounter.     No Known Allergies  Social History   Social History  . Marital status: Married    Spouse name: N/A  . Number of children: N/A  . Years of education: N/A   Occupational History  . Not on file.   Social History Main Topics  . Smoking status: Never Smoker  . Smokeless tobacco: Not on file  . Alcohol use No  . Drug use: No  . Sexual activity: Not on file   Other Topics Concern  . Not on file   Social History Narrative  . No narrative on file    Family History  Problem Relation Age of Onset  . Stroke Mother   . Cancer Father     throat  . Diabetes Sister   . Diabetes Sister   . Diabetes Sister   . Cancer Sister     breast  . Heart disease Neg Hx     ROS- All systems are reviewed and negative except as per the HPI above  Physical Exam: Vitals:   06/03/16 0840  BP: (!) 142/84  Pulse: 76  Weight: 210 lb 12.8 oz (95.6 kg)  Height: 5\' 8"  (1.727 m)    GEN- The patient is well appearing, alert and oriented x 3 today.  Head- normocephalic, atraumatic Eyes-  Sclera clear, conjunctiva pink Ears- hearing intact Oropharynx- clear Neck- supple, no JVP Lymph- no cervical lymphadenopathy Lungs- Clear to ausculation bilaterally, normal work of breathing Heart- Regular rate and rhythm, no murmurs, rubs or gallops, PMI not laterally displaced GI- soft, NT, ND, + BS Extremities- no clubbing, cyanosis, or edema MS- no significant deformity or atrophy Skin- no rash or lesion Psych- euthymic mood, full affect Neuro- strength and sensation are intact  EKG- NSR at 76 bpm, pr int 184 ms, qrs int 98 ms, qtc 434 ms Epic records reviewed Echo-- Left ventricle: The cavity size was normal. There was mild   concentric  hypertrophy. Systolic function was normal. The   estimated ejection fraction was in the range of 55% to 60%. Wall   motion was normal; there were no regional wall motion   abnormalities. Left ventricular diastolic function parameters   were normal. - Aortic valve: Trileaflet; normal thickness leaflets. - Aortic root: The aortic root was normal in size. - Mitral valve: Structurally normal valve. There was no   regurgitation. - Left atrium: The atrium was mildly dilated. - Right ventricle: The cavity size was normal. Wall thickness was   normal. Systolic function was normal. - Right atrium: The atrium was normal in size. - Tricuspid valve: There was no regurgitation. - Pulmonic valve: There was no regurgitation. - Pulmonary arteries: Systolic pressure was within the normal   range. - Inferior vena cava: The vessel was normal in size. - Pericardium, extracardiac: There was no pericardial effusion.  Assessment and Plan: 1. New onset afib General education of afib management reviewed  On appropriate meds Continue cardizem daily and prn cardizem for epiosdes of afib Discussed when appropriate  to go to the ER with afib and how to manage at home Continue Xarelto 20 mg for CHA2DS2VASc score of 3  2. Lifestyle issues contributing to afb Pt has stopped high energy vitamins that she recently started that may have precipitated afib, and reduced caffeine Continue walking program Weight loss efforts encouraged and weight management class offered  3.  Start of Anticoagulants No bleeding history Bleeding precautions given Symptoms to watch for in case of bleeding discussed  Afib clinic as needed  Butch Penny C. Tyleah Loh, [facility] [address] [place], [place] [address] [phone]

## 2016-06-15 ENCOUNTER — Telehealth: Payer: Self-pay | Admitting: *Deleted

## 2016-06-15 NOTE — Telephone Encounter (Signed)
Patient called asking what type of medication she is allowed to use for pain... Pt states that you advised her to use tylenol but she states that she does not like to use tylenol.

## 2016-06-15 NOTE — Telephone Encounter (Signed)
Notified pt of note... Pt states that she is needing pain in elbows and arms at night, pt feels like it is arthritis.  Pt states that she will wait to discuss with you at her next OV.

## 2016-06-15 NOTE — Telephone Encounter (Signed)
What pain is she taking it for?  Given her abnormal kidney marker, I don't want her taking Ibuprofen, Aleve, Motrin, NSAID type medication such as these.    May need to f/u to discuss pains and next steps.

## 2016-06-22 MED FILL — EDARBYCLOR 40-12.5 MG TAB: 40-12.5 | 30 days supply | Qty: 30 | Fill #1

## 2016-06-22 MED FILL — CARTIA XT 120 MG CAPSULE SA: 120 | 30 days supply | Qty: 30 | Fill #1

## 2016-07-06 ENCOUNTER — Telehealth: Payer: Self-pay | Admitting: Medical

## 2016-07-06 NOTE — Telephone Encounter (Signed)
I deferred the Afib to cardiology, so my understanding is that she should continue this certianly for the time being.

## 2016-07-06 NOTE — Telephone Encounter (Signed)
Ok so she will need a discount card for [employer]

## 2016-07-06 NOTE — Telephone Encounter (Signed)
Pt called and stated that when she went to cardio they told her everything was fine. She wants to know if she should stay on Xarelto and Cardizem. Please advise pt and if she needs to continue on Xarelto her discount card has expired and she will need another one. Pt can be reached at [phone].

## 2016-07-07 MED FILL — XARELTO 20 MG TABLET: 20 | 30 days supply | Qty: 30 | Fill #1

## 2016-07-07 NOTE — Telephone Encounter (Signed)
Get coupon to patient for xarelto

## 2016-07-07 NOTE — Telephone Encounter (Signed)
Called pt back advised of [place] directions.

## 2016-07-07 NOTE — Telephone Encounter (Signed)
I reviewed the most recent note that shows Afib.  So if there is any confusion, she should call back to cardiology about this question.  I didn't see any comment about a vitamin.   She will likely be on Cardizem indefinable and Xarelto for at least 6 months if not indefinitely for atrial fibrillation and the associated risks of stroke.

## 2016-07-07 NOTE — Telephone Encounter (Signed)
Pt questions why she needs to be on cardizem and xarelto. Pt states she was told yesterday her heart is fine and the palpitations were linked to high dose multivitamin.Kathy [name]

## 2016-07-07 NOTE — Telephone Encounter (Signed)
Pt was called and no message was able to be left. Pt then called back and shauna answered pt had additional questions call was sent back to assistant.

## 2016-07-19 ENCOUNTER — Encounter: Payer: Self-pay | Admitting: Medical

## 2016-07-19 ENCOUNTER — Other Ambulatory Visit (HOSPITAL_COMMUNITY)
Admission: RE | Admit: 2016-07-19 | Discharge: 2016-07-19 | Disposition: A | Discharge status: Home / Self Care | Payer: 59 | Source: Ambulatory Visit | Attending: Medical | Admitting: Medical

## 2016-07-19 ENCOUNTER — Ambulatory Visit (INDEPENDENT_AMBULATORY_CARE_PROVIDER_SITE_OTHER): Payer: 59 | Admitting: Medical

## 2016-07-19 VITALS — BP 140/82 | HR 61 | Wt 214.2 lb

## 2016-07-19 DIAGNOSIS — E785 Hyperlipidemia, unspecified: Secondary | ICD-10-CM

## 2016-07-19 DIAGNOSIS — Z1231 Encounter for screening mammogram for malignant neoplasm of breast: Secondary | ICD-10-CM | POA: Diagnosis not present

## 2016-07-19 DIAGNOSIS — Z780 Asymptomatic menopausal state: Secondary | ICD-10-CM | POA: Diagnosis not present

## 2016-07-19 DIAGNOSIS — Z0000 Encounter for general adult medical examination without abnormal findings: Secondary | ICD-10-CM | POA: Diagnosis not present

## 2016-07-19 DIAGNOSIS — E2839 Other primary ovarian failure: Secondary | ICD-10-CM | POA: Diagnosis not present

## 2016-07-19 DIAGNOSIS — R809 Proteinuria, unspecified: Secondary | ICD-10-CM

## 2016-07-19 DIAGNOSIS — E118 Type 2 diabetes mellitus with unspecified complications: Secondary | ICD-10-CM | POA: Diagnosis not present

## 2016-07-19 DIAGNOSIS — I480 Paroxysmal atrial fibrillation: Secondary | ICD-10-CM | POA: Insufficient documentation

## 2016-07-19 DIAGNOSIS — Z1159 Encounter for screening for other viral diseases: Secondary | ICD-10-CM | POA: Diagnosis not present

## 2016-07-19 DIAGNOSIS — Z1239 Encounter for other screening for malignant neoplasm of breast: Secondary | ICD-10-CM

## 2016-07-19 DIAGNOSIS — Z7189 Other specified counseling: Secondary | ICD-10-CM | POA: Diagnosis not present

## 2016-07-19 DIAGNOSIS — I10 Essential (primary) hypertension: Secondary | ICD-10-CM | POA: Diagnosis not present

## 2016-07-19 DIAGNOSIS — Z1211 Encounter for screening for malignant neoplasm of colon: Secondary | ICD-10-CM

## 2016-07-19 DIAGNOSIS — Z124 Encounter for screening for malignant neoplasm of cervix: Secondary | ICD-10-CM | POA: Insufficient documentation

## 2016-07-19 DIAGNOSIS — I4891 Unspecified atrial fibrillation: Secondary | ICD-10-CM

## 2016-07-19 DIAGNOSIS — Z01419 Encounter for gynecological examination (general) (routine) without abnormal findings: Secondary | ICD-10-CM | POA: Diagnosis not present

## 2016-07-19 DIAGNOSIS — Z7185 Encounter for immunization safety counseling: Secondary | ICD-10-CM | POA: Insufficient documentation

## 2016-07-19 LAB — POCT URINALYSIS DIPSTICK
BILIRUBIN UA: NEGATIVE
Glucose, UA: NEGATIVE
Ketones, UA: NEGATIVE
LEUKOCYTES UA: NEGATIVE
NITRITE UA: NEGATIVE
PH UA: 5.5
RBC UA: NEGATIVE
Spec Grav, UA: 1.015
Urobilinogen, UA: NEGATIVE

## 2016-07-19 NOTE — Addendum Note (Signed)
Addended by: Tyrone Apple on: 07/19/2016 10:57 AM   Modules accepted: Orders

## 2016-07-19 NOTE — Progress Notes (Signed)
Subjective: Chief Complaint  Patient presents with  . follow up dm    6-8 week follow up  for dm, afib    Here for f/u on diabetes, afib.  Last visit on 05/24/16 she had new onset Afib, and I had her seen promptly in the Afib clinic.  At that time we restarted Edarbychlor and she is compliant with this, Edarbychlor 40/12.5mg  daily. She had been noncompliant with BP medication, was having palpations that she was attributing to the Edarbychlor, and her microabuminuria numbers were quite high.  Last visit we stopped Wilder Glade due to concern for kidney protection.  We continued Kombiglyze.    We also started Diltiazem and Xarelto.  She is compliant with Xarelto 20mg  daily and Cardizem 30mg  daily and prn use q4 hours for palpations  She is compliant with Zocor 20mg  1/2 tablet daily due to aches she had cut back to 1/2 tablet daily.   She notes lately getting knee pains, stiffness and pains in knees, gets pains in arms.     Diabetes - chest sugars, this morning 130-140s.  Getting some exercise with walking 45min BID.       No foot concerns, checks feet.   Eating healthy, walks a few days per week for exercise.    The following portions of the patient's history were reviewed and updated as appropriate: allergies, current medications, past family history, past medical history, past social history, past surgical history and problem list.  ROS as in subjective above  Past Medical History:  Diagnosis Date  . Atrial fibrillation ([place]) 05/2016  . Diabetes mellitus without complication ([place])    type II  . History of Helicobacter pylori infection   . Hyperlipidemia   . Hypertension   . Microalbuminuria 2016  . Obesity     Past Surgical History:  Procedure Laterality Date  . COLONOSCOPY     age 60yo    Social History   Social History  . Marital status: Married    Spouse name: N/A  . Number of children: N/A  . Years of education: N/A   Occupational History  . Not on file.    Social History Main Topics  . Smoking status: Never Smoker  . Smokeless tobacco: Never Used  . Alcohol use Yes     Comment: rarely  . Drug use: No  . Sexual activity: Not on file   Other Topics Concern  . Not on file   Social History Narrative   Lives with husband and son.   Retired.    Does housework, cooks, clean, busy.  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 Stroke Mother   . Cancer Father     throat  . Diabetes Sister   . Diabetes Sister   . Diabetes Sister   . Cancer Sister     breast  . Heart disease Neg Hx      Current Outpatient Prescriptions:  .  Azilsartan-Chlorthalidone 40-12.5 MG TABS, Take 1 tablet by mouth daily., Disp: 30 tablet, Rfl: 2 .  diltiazem (CARDIZEM CD) 120 MG 24 hr capsule, Take 1 capsule (120 mg total) by mouth daily., Disp: 30 capsule, Rfl: 2 .  diltiazem (CARDIZEM) 30 MG tablet, Cardizem 30mg  -- take 1 tablet every 4 hours AS NEEDED for heart rate >100 as long as blood pressure >100., Disp: 45 tablet, Rfl: 1 .  rivaroxaban (XARELTO) 20 MG TABS tablet, Take 1 tablet (20 mg total) by mouth  daily with supper., Disp: 30 tablet, Rfl: 2 .  Saxagliptin-Metformin (KOMBIGLYZE XR) 01-999 MG TB24, Take 1 tablet by mouth daily., Disp: 90 tablet, Rfl: 0 .  simvastatin (ZOCOR) 20 MG tablet, Take 10 mg by mouth daily., Disp: , Rfl:   No Known Allergies  ROS as in subjective   Objective:   BP 140/82   Pulse 61   Wt 214 lb 3.2 oz (97.2 kg)   SpO2 98%   BMI 32.57 kg/m   Wt Readings from Last 3 Encounters:  07/19/16 214 lb 3.2 oz (97.2 kg)  06/03/16 210 lb 12.8 oz (95.6 kg)  05/26/16 216 lb 3.2 oz (98.1 kg)   BP Readings from Last 3 Encounters:  07/19/16 140/82  06/03/16 (!) 142/84  05/26/16 138/80   General appearance: alert, no distress, WD/WN, AA female HEENT: normocephalic, sclerae anicteric, TMs pearly, nares patent, no discharge or erythema, pharynx normal Oral cavity: MMM, upper gums edentulous, no  obvious abscess or worrisome finding Neck: supple, no lymphadenopathy, no thyromegaly, no masses, no bruits Heart:regular today, no murmurs, normal S1, s2 Lungs: CTA bilaterally, no wheezes, rhonchi, or rales Pulses: 2+ symmetric, upper, and 1+ symmetric lower extremities, normal cap refill See separate foot exam bilat knees with bony arthritis changes, no obvious swelling, slight laxity laterally of left knee, rest of legs unremarkable Abdomen: +bs, soft, non tender, non distended, no masses, no hepatomegaly, no splenomegaly, no bruits Back: non tender, normal ROM, no scoliosis Extremities: no edema, no cyanosis, no clubbing Neurological: alert, oriented x 3, CN2-12 intact, strength normal upper extremities and lower extremities, sensation normal throughout, DTRs 2+ throughout, no cerebellar signs, gait normal Psychiatric: normal affect, behavior normal, pleasant  Breast: nontender, no masses or lumps, no skin changes, no nipple discharge or inversion, no axillary lymphadenopathy Gyn: Normal external genitalia except left labia minora just lateral to introitus with small 33mm diameter white hypopigmented area, otherwise without lesions, vagina with normal mucosa, cervix without lesions, no cervical motion tenderness, no abnormal vaginal discharge.  Uterus and adnexa not enlarged, nontender, no masses.  Pap performed.  Exam chaperoned by nurse. Rectal: deferred   Diabetic Foot Exam - Simple   Simple Foot Form Visual Inspection No deformities, no ulcerations, no other skin breakdown bilaterally:  Yes Sensation Testing Intact to touch and monofilament testing bilaterally:  Yes Pulse Check Posterior Tibialis and Dorsalis pulse intact bilaterally:  Yes Comments      Assessment:   Encounter Diagnoses  Name Primary?  . Encounter for health maintenance examination in adult Yes  . Essential hypertension, benign   . Type 2 diabetes mellitus with complication, without long-term current use  of insulin ([place])   . Microalbuminuria   . Hyperlipidemia, unspecified hyperlipidemia type   . Atrial fibrillation, unspecified type ([place])   . Screening for breast cancer   . Special screening for malignant neoplasms, colon   . Screening for cervical cancer   . Post-menopausal   . Estrogen deficiency   . Vaccine counseling   . Need for hepatitis C screening test     Plan:   Discussed preventative care measures, sick visits, well visits, routine f/u on chronic issues.    advised she check insurance coverage for shingles and Hep B vaccines She is up to date on vaccine otherwise  discussed colon cancer screening, refer for Cologuard.  She wants to do this instead of colonoscopy Pap sent, and she will call to scheduled mammogram and bone density screen  Osteoporosis screening questionnaire: risk factors include  menopause, estrogen deficiency, low calcium intake  DM type 2 - c/t Kombiglyze, advised yearly eye doctor visit, daily foot checks.   afib - c/t xarelto, diltiazem, reviewed most recent Afib clinic notes  C/t rest of medications as usual  discussed microalbuminuria  Ivanna was seen today for follow up dm.  Diagnoses and all orders for this visit:  Encounter for health maintenance examination in adult -     MM DIGITAL SCREENING BILATERAL; Future -     DG Bone Density; Future -     Cytology - PAP -     Hepatitis C antibody -     VITAMIN D 25 Hydroxy (Vit-D Deficiency, Fractures)  Essential hypertension, benign  Type 2 diabetes mellitus with complication, without long-term current use of insulin ([facility])  Microalbuminuria  Hyperlipidemia, unspecified hyperlipidemia type  Atrial fibrillation, unspecified type ([place])  Screening for breast cancer -     MM DIGITAL SCREENING BILATERAL; Future  Special screening for malignant neoplasms, colon  Screening for cervical cancer -     Cytology - PAP  Post-menopausal -     DG Bone Density; Future -     VITAMIN D 25  Hydroxy (Vit-D Deficiency, Fractures)  Estrogen deficiency -     DG Bone Density; Future -     VITAMIN D 25 Hydroxy (Vit-D Deficiency, Fractures)  Vaccine counseling  Need for hepatitis C screening test -     Hepatitis C antibody

## 2016-07-19 NOTE — Patient Instructions (Signed)
Recommendations  Call and schedule your mammogram and bone density scan  Exercise daily  Eat a healthy low fat diabetic diet  Call your insurer about coverage for Hepatitis B vaccine and Shingles vaccine  Call your insurer about coverage for the Cologuard Colon Cancer screening test  Continue your current medications   Health Maintenance, Female Adopting a healthy lifestyle and getting preventive care can go a long way to promote health and wellness. Talk with your health care provider about what schedule of regular examinations is right for you. This is a good chance for you to check in with your provider about disease prevention and staying healthy. In between checkups, there are plenty of things you can do on your own. Experts have done a lot of research about which lifestyle changes and preventive measures are most likely to keep you healthy. Ask your health care provider for more information. WEIGHT AND DIET  Eat a healthy diet  Be sure to include plenty of vegetables, fruits, low-fat dairy products, and lean protein.  Do not eat a lot of foods high in solid fats, added sugars, or salt.  Get regular exercise. This is one of the most important things you can do for your health.  Most adults should exercise for at least 150 minutes each week. The exercise should increase your heart rate and make you sweat (moderate-intensity exercise).  Most adults should also do strengthening exercises at least twice a week. This is in addition to the moderate-intensity exercise.  Maintain a healthy weight  Body mass index (BMI) is a measurement that can be used to identify possible weight problems. It estimates body fat based on height and weight. Your health care provider can help determine your BMI and help you achieve or maintain a healthy weight.  For females 87 years of age and older:   A BMI below 18.5 is considered underweight.  A BMI of 18.5 to 24.9 is normal.  A BMI of 25 to  29.9 is considered overweight.  A BMI of 30 and above is considered obese.  Watch levels of cholesterol and blood lipids  You should start having your blood tested for lipids and cholesterol at 60 years of age, then have this test every 5 years.  You may need to have your cholesterol levels checked more often if:  Your lipid or cholesterol levels are high.  You are older than 60 years of age.  You are at high risk for heart disease.  CANCER SCREENING   Lung Cancer  Lung cancer screening is recommended for adults 33-20 years old who are at high risk for lung cancer because of a history of smoking.  A yearly low-dose CT scan of the lungs is recommended for people who:  Currently smoke.  Have quit within the past 15 years.  Have at least a 30-pack-year history of smoking. A pack year is smoking an average of one pack of cigarettes a day for 1 year.  Yearly screening should continue until it has been 15 years since you quit.  Yearly screening should stop if you develop a health problem that would prevent you from having lung cancer treatment.  Breast Cancer  Practice breast self-awareness. This means understanding how your breasts normally appear and feel.  It also means doing regular breast self-exams. Let your health care provider know about any changes, no matter how small.  If you are in your 20s or 30s, you should have a clinical breast exam (CBE) by a health  care provider every 1-3 years as part of a regular health exam.  If you are 48 or older, have a CBE every year. Also consider having a breast X-ray (mammogram) every year.  If you have a family history of breast cancer, talk to your health care provider about genetic screening.  If you are at high risk for breast cancer, talk to your health care provider about having an MRI and a mammogram every year.  Breast cancer gene (BRCA) assessment is recommended for women who have family members with BRCA-related  cancers. BRCA-related cancers include:  Breast.  Ovarian.  Tubal.  Peritoneal cancers.  Results of the assessment will determine the need for genetic counseling and BRCA1 and BRCA2 testing. Cervical Cancer Your health care provider may recommend that you be screened regularly for cancer of the pelvic organs (ovaries, uterus, and vagina). This screening involves a pelvic examination, including checking for microscopic changes to the surface of your cervix (Pap test). You may be encouraged to have this screening done every 3 years, beginning at age 9.  For women ages 16-65, health care providers may recommend pelvic exams and Pap testing every 3 years, or they may recommend the Pap and pelvic exam, combined with testing for human papilloma virus (HPV), every 5 years. Some types of HPV increase your risk of cervical cancer. Testing for HPV may also be done on women of any age with unclear Pap test results.  Other health care providers may not recommend any screening for nonpregnant women who are considered low risk for pelvic cancer and who do not have symptoms. Ask your health care provider if a screening pelvic exam is right for you.  If you have had past treatment for cervical cancer or a condition that could lead to cancer, you need Pap tests and screening for cancer for at least 20 years after your treatment. If Pap tests have been discontinued, your risk factors (such as having a new sexual partner) need to be reassessed to determine if screening should resume. Some women have medical problems that increase the chance of getting cervical cancer. In these cases, your health care provider may recommend more frequent screening and Pap tests. Colorectal Cancer  This type of cancer can be detected and often prevented.  Routine colorectal cancer screening usually begins at 60 years of age and continues through 60 years of age.  Your health care provider may recommend screening at an earlier  age if you have risk factors for colon cancer.  Your health care provider may also recommend using home test kits to check for hidden blood in the stool.  A small camera at the end of a tube can be used to examine your colon directly (sigmoidoscopy or colonoscopy). This is done to check for the earliest forms of colorectal cancer.  Routine screening usually begins at age 36.  Direct examination of the colon should be repeated every 5-10 years through 60 years of age. However, you may need to be screened more often if early forms of precancerous polyps or small growths are found. Skin Cancer  Check your skin from head to toe regularly.  Tell your health care provider about any new moles or changes in moles, especially if there is a change in a mole's shape or color.  Also tell your health care provider if you have a mole that is larger than the size of a pencil eraser.  Always use sunscreen. Apply sunscreen liberally and repeatedly throughout the day.  Protect  yourself by wearing long sleeves, pants, a wide-brimmed hat, and sunglasses whenever you are outside. HEART DISEASE, DIABETES, AND HIGH BLOOD PRESSURE   High blood pressure causes heart disease and increases the risk of stroke. High blood pressure is more likely to develop in:  People who have blood pressure in the high end of the normal range (130-139/85-89 mm Hg).  People who are overweight or obese.  People who are African American.  If you are 2-2 years of age, have your blood pressure checked every 3-5 years. If you are 30 years of age or older, have your blood pressure checked every year. You should have your blood pressure measured twice--once when you are at a hospital or clinic, and once when you are not at a hospital or clinic. Record the average of the two measurements. To check your blood pressure when you are not at a hospital or clinic, you can use:  An automated blood pressure machine at a pharmacy.  A home  blood pressure monitor.  If you are between 66 years and 91 years old, ask your health care provider if you should take aspirin to prevent strokes.  Have regular diabetes screenings. This involves taking a blood sample to check your fasting blood sugar level.  If you are at a normal weight and have a low risk for diabetes, have this test once every three years after 60 years of age.  If you are overweight and have a high risk for diabetes, consider being tested at a younger age or more often. PREVENTING INFECTION  Hepatitis B  If you have a higher risk for hepatitis B, you should be screened for this virus. You are considered at high risk for hepatitis B if:  You were born in a country where hepatitis B is common. Ask your health care provider which countries are considered high risk.  Your parents were born in a high-risk country, and you have not been immunized against hepatitis B (hepatitis B vaccine).  You have HIV or AIDS.  You use needles to inject street drugs.  You live with someone who has hepatitis B.  You have had sex with someone who has hepatitis B.  You get hemodialysis treatment.  You take certain medicines for conditions, including cancer, organ transplantation, and autoimmune conditions. Hepatitis C  Blood testing is recommended for:  Everyone born from [birth year] through 1965.  Anyone with known risk factors for hepatitis C. Sexually transmitted infections (STIs)  You should be screened for sexually transmitted infections (STIs) including gonorrhea and chlamydia if:  You are sexually active and are younger than 60 years of age.  You are older than 60 years of age and your health care provider tells you that you are at risk for this type of infection.  Your sexual activity has changed since you were last screened and you are at an increased risk for chlamydia or gonorrhea. Ask your health care provider if you are at risk.  If you do not have HIV, but are at  risk, it may be recommended that you take a prescription medicine daily to prevent HIV infection. This is called pre-exposure prophylaxis (PrEP). You are considered at risk if:  You are sexually active and do not regularly use condoms or know the HIV status of your partner(s).  You take drugs by injection.  You are sexually active with a partner who has HIV. Talk with your health care provider about whether you are at high risk of being infected with  HIV. If you choose to begin PrEP, you should first be tested for HIV. You should then be tested every 3 months for as long as you are taking PrEP.  PREGNANCY   If you are premenopausal and you may become pregnant, ask your health care provider about preconception counseling.  If you may become pregnant, take 400 to 800 micrograms (mcg) of folic acid every day.  If you want to prevent pregnancy, talk to your health care provider about birth control (contraception). OSTEOPOROSIS AND MENOPAUSE   Osteoporosis is a disease in which the bones lose minerals and strength with aging. This can result in serious bone fractures. Your risk for osteoporosis can be identified using a bone density scan.  If you are 40 years of age or older, or if you are at risk for osteoporosis and fractures, ask your health care provider if you should be screened.  Ask your health care provider whether you should take a calcium or vitamin D supplement to lower your risk for osteoporosis.  Menopause may have certain physical symptoms and risks.  Hormone replacement therapy may reduce some of these symptoms and risks. Talk to your health care provider about whether hormone replacement therapy is right for you.  HOME CARE INSTRUCTIONS   Schedule regular health, dental, and eye exams.  Stay current with your immunizations.   Do not use any tobacco products including cigarettes, chewing tobacco, or electronic cigarettes.  If you are pregnant, do not drink alcohol.  If  you are breastfeeding, limit how much and how often you drink alcohol.  Limit alcohol intake to no more than 1 drink per day for nonpregnant women. One drink equals 12 ounces of beer, 5 ounces of wine, or 1 ounces of hard liquor.  Do not use street drugs.  Do not share needles.  Ask your health care provider for help if you need support or information about quitting drugs.  Tell your health care provider if you often feel depressed.  Tell your health care provider if you have ever been abused or do not feel safe at home.   This information is not intended to replace advice given to you by your health care provider. Make sure you discuss any questions you have with your health care provider.   Document Released: 03/15/2011 Document Revised: 09/20/2014 Document Reviewed: 08/01/2013 Elsevier Interactive Patient Education [employer].

## 2016-07-20 LAB — HEPATITIS C ANTIBODY: HCV Ab: NEGATIVE

## 2016-07-20 LAB — VITAMIN D 25 HYDROXY (VIT D DEFICIENCY, FRACTURES): Vit D, 25-Hydroxy: 32 ng/mL (ref 30–100)

## 2016-07-21 ENCOUNTER — Telehealth: Payer: Self-pay | Admitting: Medical

## 2016-07-21 DIAGNOSIS — Z780 Asymptomatic menopausal state: Secondary | ICD-10-CM | POA: Diagnosis not present

## 2016-07-21 DIAGNOSIS — Z1231 Encounter for screening mammogram for malignant neoplasm of breast: Secondary | ICD-10-CM | POA: Diagnosis not present

## 2016-07-21 LAB — CYTOLOGY - PAP: Diagnosis: NEGATIVE

## 2016-07-21 LAB — HM DEXA SCAN: HM Dexa Scan: NORMAL

## 2016-07-21 LAB — HM MAMMOGRAPHY

## 2016-07-21 MED FILL — EDARBYCLOR 40-12.5 MG TAB: 40-12.5 | 30 days supply | Qty: 30 | Fill #2

## 2016-07-21 MED FILL — CARTIA XT 120 MG CAPSULE SA: 120 | 30 days supply | Qty: 30 | Fill #2

## 2016-07-21 NOTE — Telephone Encounter (Signed)
Mammogram normal/no worrisome findings.Mammogram normal/no worrisome findings.

## 2016-07-22 NOTE — Telephone Encounter (Signed)
Already talk to her about this

## 2016-07-22 NOTE — Telephone Encounter (Signed)
Attempted call to pt- vcm not set up. Kathy [name]

## 2016-07-27 ENCOUNTER — Encounter: Payer: Self-pay | Admitting: Medical

## 2016-07-27 ENCOUNTER — Telehealth: Payer: Self-pay | Admitting: Medical

## 2016-07-27 NOTE — Telephone Encounter (Signed)
Good news, the Bone Density scan is normal.  No osteoporosis.     I recommend she exercise with walking and some weight bearing exercise several days per week, I recommend she get 1200mg  of calcium in diet from dairy or other source or supplement daily and Vit D 1000 IU OTC daily.   We can plan to recheck bone density scan in 4-5 years.

## 2016-07-27 NOTE — Telephone Encounter (Signed)
Called l/m for her to call us back

## 2016-07-28 NOTE — Telephone Encounter (Signed)
Called and patient results and information

## 2016-08-06 MED FILL — XARELTO 20 MG TABLET: 20 | 30 days supply | Qty: 30 | Fill #2

## 2016-08-18 ENCOUNTER — Other Ambulatory Visit: Payer: Self-pay | Admitting: Medical

## 2016-08-18 MED FILL — CARTIA XT 120 MG CAPSULE SA: 120 | 30 days supply | Qty: 30 | Fill #0

## 2016-08-18 MED FILL — EDARBYCLOR 40-12.5 MG TAB: 40-12.5 | 30 days supply | Qty: 30 | Fill #0

## 2016-08-18 MED FILL — KOMBIGLYZE XR 5-1,000 MG TA: 5-1000 | 90 days supply | Qty: 90 | Fill #0

## 2016-09-07 ENCOUNTER — Other Ambulatory Visit: Payer: Self-pay | Admitting: Medical

## 2016-09-07 MED FILL — XARELTO 20 MG TABLET: 20 | 30 days supply | Qty: 30 | Fill #0

## 2016-09-20 MED FILL — EDARBYCLOR 40-12.5 MG TAB: 40-12.5 | 30 days supply | Qty: 30 | Fill #1

## 2016-09-20 MED FILL — CARTIA XT 120 MG CAPSULE SA: 120 | 30 days supply | Qty: 30 | Fill #1

## 2016-10-06 MED FILL — XARELTO 20 MG TABLET: 20 | 30 days supply | Qty: 30 | Fill #1

## 2016-10-22 MED FILL — CARTIA XT 120 MG CAPSULE SA: 120 | 30 days supply | Qty: 30 | Fill #2

## 2016-10-22 MED FILL — EDARBYCLOR 40-12.5 MG TAB: 40-12.5 | 30 days supply | Qty: 30 | Fill #2

## 2016-11-18 ENCOUNTER — Ambulatory Visit (INDEPENDENT_AMBULATORY_CARE_PROVIDER_SITE_OTHER): Payer: 59 | Admitting: Medical

## 2016-11-18 ENCOUNTER — Other Ambulatory Visit: Payer: Self-pay | Admitting: Medical

## 2016-11-18 ENCOUNTER — Encounter: Payer: Self-pay | Admitting: Medical

## 2016-11-18 VITALS — BP 148/82 | HR 72 | Wt 225.0 lb

## 2016-11-18 DIAGNOSIS — E785 Hyperlipidemia, unspecified: Secondary | ICD-10-CM

## 2016-11-18 DIAGNOSIS — E118 Type 2 diabetes mellitus with unspecified complications: Secondary | ICD-10-CM | POA: Diagnosis not present

## 2016-11-18 DIAGNOSIS — G4700 Insomnia, unspecified: Secondary | ICD-10-CM | POA: Diagnosis not present

## 2016-11-18 DIAGNOSIS — I4891 Unspecified atrial fibrillation: Secondary | ICD-10-CM | POA: Diagnosis not present

## 2016-11-18 DIAGNOSIS — K069 Disorder of gingiva and edentulous alveolar ridge, unspecified: Secondary | ICD-10-CM

## 2016-11-18 DIAGNOSIS — R809 Proteinuria, unspecified: Secondary | ICD-10-CM

## 2016-11-18 DIAGNOSIS — I10 Essential (primary) hypertension: Secondary | ICD-10-CM

## 2016-11-18 DIAGNOSIS — K5900 Constipation, unspecified: Secondary | ICD-10-CM | POA: Diagnosis not present

## 2016-11-18 LAB — CBC
HEMATOCRIT: 40 % (ref 35.0–45.0)
Hemoglobin: 13.2 g/dL (ref 11.7–15.5)
MCH: 26.8 pg — ABNORMAL LOW (ref 27.0–33.0)
MCHC: 33 g/dL (ref 32.0–36.0)
MCV: 81.1 fL (ref 80.0–100.0)
MPV: 9.6 fL (ref 7.5–12.5)
Platelets: 314 10*3/uL (ref 140–400)
RBC: 4.93 MIL/uL (ref 3.80–5.10)
RDW: 14.2 % (ref 11.0–15.0)
WBC: 8 10*3/uL (ref 4.0–10.5)

## 2016-11-18 LAB — COMPREHENSIVE METABOLIC PANEL
ALT: 11 U/L (ref 6–29)
AST: 10 U/L (ref 10–35)
Albumin: 4.1 g/dL (ref 3.6–5.1)
Alkaline Phosphatase: 96 U/L (ref 33–130)
BUN: 25 mg/dL (ref 7–25)
CHLORIDE: 102 mmol/L (ref 98–110)
CO2: 22 mmol/L (ref 20–31)
Calcium: 10.1 mg/dL (ref 8.6–10.4)
Creat: 1.22 mg/dL — ABNORMAL HIGH (ref 0.50–0.99)
Glucose, Bld: 147 mg/dL — ABNORMAL HIGH (ref 65–99)
Potassium: 5 mmol/L (ref 3.5–5.3)
Sodium: 136 mmol/L (ref 135–146)
TOTAL PROTEIN: 7.8 g/dL (ref 6.1–8.1)
Total Bilirubin: 0.4 mg/dL (ref 0.2–1.2)

## 2016-11-18 LAB — HEMOGLOBIN A1C
Hgb A1c MFr Bld: 7.7 % — ABNORMAL HIGH (ref <5.7)
Mean Plasma Glucose: 174 mg/dL

## 2016-11-18 MED FILL — CARTIA XT 120 MG CAPSULE SA: 120 | 30 days supply | Qty: 30 | Fill #0

## 2016-11-18 MED FILL — EDARBYCLOR 40-12.5 MG TAB: 40-12.5 | 30 days supply | Qty: 30 | Fill #0

## 2016-11-18 MED FILL — KOMBIGLYZE XR 5-1,000 MG TA: 5-1000 | 90 days supply | Qty: 90 | Fill #0

## 2016-11-18 NOTE — Progress Notes (Signed)
Subjective: Chief Complaint  Patient presents with  . dm check    dm check , having a lot of gas    Here for routine f/u.  [name] is here for routine f/u on HTN, afib, diabetes, hyperlipidemia, microalbuminuria.  Lately been having problems with constipation, bloating, gassy, and only seems to have BM if taking milk of magnesia.  No blood in stool.     Having some trouble sleeping at time.  complaint with medications.     Checking glucose every other day.  Seeing 120-130s.  This morning was 160.  Not checking BP, doesn't have a meter.  suppose to be having teeth pulled soon, needs advice on stopping xarelto temporarily.  Past Medical History:  Diagnosis Date  . Atrial fibrillation ([place]) 05/2016  . Diabetes mellitus without complication ([place])    type II  . History of Helicobacter pylori infection   . Hyperlipidemia   . Hypertension   . Microalbuminuria 2016  . Obesity    Current Outpatient Prescriptions on File Prior to Visit  Medication Sig Dispense Refill  . CARTIA XT 120 MG 24 hr capsule TAKE 1 CAPSULE BY MOUTH DAILY. 30 capsule 2  . EDARBYCLOR 40-12.5 MG TABS TAKE 1 TABLET BY MOUTH DAILY. 30 tablet 2  . Saxagliptin-Metformin (KOMBIGLYZE XR) 01-999 MG TB24 Take 1 tablet by mouth daily. 90 tablet 0  . simvastatin (ZOCOR) 20 MG tablet Take 10 mg by mouth daily.    Alveda Reasons 20 MG TABS tablet TAKE 1 TABLET BY MOUTH DAILY WITH SUPPER. 30 tablet 2  . diltiazem (CARDIZEM) 30 MG tablet Cardizem 30mg  -- take 1 tablet every 4 hours AS NEEDED for heart rate >100 as long as blood pressure >100. (Patient not taking: Reported on 11/18/2016) 45 tablet 1   No current facility-administered medications on file prior to visit.    ROS as in subjective   Objective: BP (!) 148/82   Pulse 72   Wt 225 lb (102.1 kg)   SpO2 98%   BMI 34.21 kg/m   General appearance: alert, no distress, WD/WN,  Neck: supple, no lymphadenopathy, no thyromegaly, no masses, pulsations in neck but no  obvious JVD Heart: RRR, normal S1, S2, no murmurs Lungs: CTA bilaterally, no wheezes, rhonchi, or rales Abdomen: +bs, soft, non tender, non distended, no masses, no hepatomegaly, no splenomegaly Pulses: 2+ symmetric, upper and lower extremities, normal cap refill Ext: no edema   Assessment: Encounter Diagnoses  Name Primary?  . Essential hypertension, benign Yes  . Atrial fibrillation, unspecified type ([place])   . Type 2 diabetes mellitus with complication, without long-term current use of insulin ([place])   . Hyperlipidemia, unspecified hyperlipidemia type   . Microalbuminuria   . Constipation, unspecified constipation type   . Insomnia, unspecified type   . Gum disease     Plan: Labs today, c/t same medications.   Will call back about Xarelto, med adjustment for BP  Microalbuminuria, HTN - need to maximize BP control.  lipids at goal.   Sugars have been mostly at goal.     constipation - begin trial of Linzess.  She is due for repeat colon cancer screen as discussed last visit.   Refer again for cologuard as she declines colonoscopy.  Insomnia - discussed sleep hygiene.   Consider Ambien limited use, but she declines for now.  F/u pending labs   [place] was seen today for dm check.  Diagnoses and all orders for this visit:  Essential hypertension, benign -  Hemoglobin A1c -     Comprehensive metabolic panel -     CBC  Atrial fibrillation, unspecified type ([facility]) -     Hemoglobin A1c -     Comprehensive metabolic panel -     CBC  Type 2 diabetes mellitus with complication, without long-term current use of insulin ([facility]) -     Hemoglobin A1c -     Comprehensive metabolic panel -     CBC  Hyperlipidemia, unspecified hyperlipidemia type -     Hemoglobin A1c -     Comprehensive metabolic panel -     CBC  Microalbuminuria -     Hemoglobin A1c -     Comprehensive metabolic panel -     CBC  Constipation, unspecified constipation type -     Hemoglobin A1c -      Comprehensive metabolic panel -     CBC  Insomnia, unspecified type -     Hemoglobin A1c -     Comprehensive metabolic panel -     CBC  Gum disease -     Hemoglobin A1c -     Comprehensive metabolic panel -     CBC

## 2016-11-23 ENCOUNTER — Other Ambulatory Visit: Payer: Self-pay | Admitting: Medical

## 2016-11-23 MED ORDER — RIVAROXABAN 20 MG PO TABS: ORAL_TABLET | ORAL | 2 refills | Status: DC

## 2016-11-23 MED ORDER — SIMVASTATIN 20 MG PO TABS: 10.0000 mg | ORAL_TABLET | Freq: Every day | ORAL | 3 refills | Status: DC

## 2016-11-23 MED ORDER — DILTIAZEM HCL ER COATED BEADS 180 MG PO CP24: 180.0000 mg | ORAL_CAPSULE | Freq: Every day | ORAL | 2 refills | Status: DC

## 2016-11-23 MED ORDER — AZILSARTAN-CHLORTHALIDONE 40-12.5 MG PO TABS: 1.0000 | ORAL_TABLET | Freq: Every day | ORAL | 3 refills | Status: DC

## 2016-11-23 MED FILL — XARELTO 20 MG TABLET: 20 | 30 days supply | Qty: 30 | Fill #0

## 2016-11-23 MED FILL — SIMVASTATIN 20 MG TABLET: 20 | 90 days supply | Qty: 45 | Fill #0

## 2016-11-23 MED FILL — CARTIA XT 180 MG CAPSULE SA: 180 | 30 days supply | Qty: 30 | Fill #0

## 2016-11-24 ENCOUNTER — Other Ambulatory Visit: Payer: Self-pay | Admitting: Medical

## 2016-11-24 DIAGNOSIS — Z79899 Other long term (current) drug therapy: Secondary | ICD-10-CM

## 2016-11-24 MED ORDER — SAXAGLIPTIN-METFORMIN ER 2.5-1000 MG PO TB24: 2.0000 | ORAL_TABLET | Freq: Every day | ORAL | 1 refills | Status: DC

## 2017-01-03 MED FILL — CARTIA XT 180 MG CAPSULE SA: 180 | 30 days supply | Qty: 30 | Fill #1

## 2017-01-11 MED FILL — EDARBYCLOR 40-12.5 MG TAB: 40-12.5 | 30 days supply | Qty: 30 | Fill #1

## 2017-01-24 MED FILL — KOMBIGLYZE XR 2.5-1,000 MG: 2.5-1000 | 90 days supply | Qty: 180 | Fill #0

## 2017-02-02 MED FILL — CARTIA XT 180 MG CAPSULE SA: 180 | 30 days supply | Qty: 30 | Fill #2

## 2017-02-08 MED FILL — SIMVASTATIN 20 MG TABLET: 20 | 90 days supply | Qty: 45 | Fill #1

## 2017-02-08 MED FILL — EDARBYCLOR 40-12.5 MG TAB: 40-12.5 | 30 days supply | Qty: 30 | Fill #2

## 2017-03-14 ENCOUNTER — Other Ambulatory Visit: Payer: Self-pay

## 2017-03-14 ENCOUNTER — Other Ambulatory Visit: Payer: Self-pay | Admitting: Medical

## 2017-03-14 ENCOUNTER — Telehealth: Payer: Self-pay | Admitting: Medical

## 2017-03-14 MED ORDER — CHLORTHALIDONE 25 MG PO TABS: 25.0000 mg | ORAL_TABLET | Freq: Every day | ORAL | 2 refills | Status: DC

## 2017-03-14 MED ORDER — LOSARTAN POTASSIUM 100 MG PO TABS: 100.0000 mg | ORAL_TABLET | Freq: Every day | ORAL | 2 refills | Status: DC

## 2017-03-14 MED ORDER — SAXAGLIPTIN-METFORMIN ER 2.5-1000 MG PO TB24: 2.0000 | ORAL_TABLET | Freq: Every day | ORAL | 1 refills | Status: DC

## 2017-03-14 NOTE — Telephone Encounter (Signed)
To replace Edarbychlor, I sent separate Losartan and Chlorthalidone, 2 separate medications to be similar to [employer].   (see me in the mornign about this)

## 2017-03-14 NOTE — Telephone Encounter (Signed)
Pt called and said that she is losing her insurance until sept. And want to know if you can change her edarbyclor  40/12.5mg  to something cheaper. Because she has to paid out of pocket for her medicines, also gave her samples of kombiglye  Ex 2.5/100mg .

## 2017-03-14 NOTE — Telephone Encounter (Signed)
Pt states that insurance changed and because of this she will have to pay out of pocket for BP med and diabetes med. Pt would like to get as much samples of med today as she can get then want to get a less expensive med until new insurance starts in August or September.

## 2017-03-15 MED FILL — CHLORTHALIDONE 25 MG TABLET: 25 | 30 days supply | Qty: 30 | Fill #0

## 2017-03-15 MED FILL — LOSARTAN POTASSIUM 100 MG T: 100 | 30 days supply | Qty: 30 | Fill #0

## 2017-03-15 NOTE — Telephone Encounter (Signed)
Pt  Was notified  

## 2017-03-15 NOTE — Telephone Encounter (Signed)
Called pt her voicemail hasn't been set up

## 2017-03-18 ENCOUNTER — Emergency Department (HOSPITAL_COMMUNITY): Payer: Self-pay

## 2017-03-18 ENCOUNTER — Emergency Department (HOSPITAL_COMMUNITY)
Admission: EM | Admit: 2017-03-18 | Discharge: 2017-03-18 | Disposition: A | Discharge status: Home / Self Care | Payer: Self-pay | Attending: Emergency Medicine | Admitting: Emergency Medicine

## 2017-03-18 ENCOUNTER — Other Ambulatory Visit: Payer: Self-pay

## 2017-03-18 ENCOUNTER — Encounter (HOSPITAL_COMMUNITY): Payer: Self-pay | Admitting: Emergency Medicine

## 2017-03-18 DIAGNOSIS — E119 Type 2 diabetes mellitus without complications: Secondary | ICD-10-CM | POA: Insufficient documentation

## 2017-03-18 DIAGNOSIS — I4891 Unspecified atrial fibrillation: Secondary | ICD-10-CM | POA: Insufficient documentation

## 2017-03-18 DIAGNOSIS — K5900 Constipation, unspecified: Secondary | ICD-10-CM | POA: Insufficient documentation

## 2017-03-18 DIAGNOSIS — Z79899 Other long term (current) drug therapy: Secondary | ICD-10-CM | POA: Insufficient documentation

## 2017-03-18 DIAGNOSIS — I10 Essential (primary) hypertension: Secondary | ICD-10-CM | POA: Insufficient documentation

## 2017-03-18 DIAGNOSIS — E785 Hyperlipidemia, unspecified: Secondary | ICD-10-CM | POA: Insufficient documentation

## 2017-03-18 LAB — CBC
HCT: 37.7 % (ref 36.0–46.0)
HEMOGLOBIN: 12.1 g/dL (ref 12.0–15.0)
MCH: 26.2 pg (ref 26.0–34.0)
MCHC: 32.1 g/dL (ref 30.0–36.0)
MCV: 81.6 fL (ref 78.0–100.0)
PLATELETS: 356 10*3/uL (ref 150–400)
RBC: 4.62 MIL/uL (ref 3.87–5.11)
RDW: 14.9 % (ref 11.5–15.5)
WBC: 10.2 10*3/uL (ref 4.0–10.5)

## 2017-03-18 LAB — COMPREHENSIVE METABOLIC PANEL
ALK PHOS: 97 U/L (ref 38–126)
ALT: 14 U/L (ref 14–54)
ANION GAP: 9 (ref 5–15)
AST: 16 U/L (ref 15–41)
Albumin: 3.6 g/dL (ref 3.5–5.0)
BILIRUBIN TOTAL: 0.5 mg/dL (ref 0.3–1.2)
BUN: 20 mg/dL (ref 6–20)
CALCIUM: 9.5 mg/dL (ref 8.9–10.3)
CO2: 22 mmol/L (ref 22–32)
Chloride: 106 mmol/L (ref 101–111)
Creatinine, Ser: 1.11 mg/dL — ABNORMAL HIGH (ref 0.44–1.00)
GFR calc non Af Amer: 53 mL/min — ABNORMAL LOW (ref >60)
Glucose, Bld: 163 mg/dL — ABNORMAL HIGH (ref 65–99)
Potassium: 4.6 mmol/L (ref 3.5–5.1)
SODIUM: 137 mmol/L (ref 135–145)
TOTAL PROTEIN: 7.6 g/dL (ref 6.5–8.1)

## 2017-03-18 LAB — URINALYSIS, ROUTINE W REFLEX MICROSCOPIC
Bacteria, UA: NONE SEEN
Bilirubin Urine: NEGATIVE
GLUCOSE, UA: NEGATIVE mg/dL
HGB URINE DIPSTICK: NEGATIVE
KETONES UR: NEGATIVE mg/dL
Leukocytes, UA: NEGATIVE
NITRITE: NEGATIVE
PH: 6 (ref 5.0–8.0)
PROTEIN: 100 mg/dL — AB
Specific Gravity, Urine: 1.012 (ref 1.005–1.030)

## 2017-03-18 LAB — I-STAT TROPONIN, ED: Troponin i, poc: 0 ng/mL (ref 0.00–0.08)

## 2017-03-18 LAB — LIPASE, BLOOD: Lipase: 75 U/L — ABNORMAL HIGH (ref 11–51)

## 2017-03-18 MED ORDER — MINERAL OIL RE ENEM: 1.0000 | ENEMA | Freq: Once | RECTAL | 0 refills | Status: AC

## 2017-03-18 MED ORDER — POLYETHYLENE GLYCOL 3350 17 G PO PACK: PACK | ORAL | 0 refills | Status: DC

## 2017-03-18 NOTE — ED Provider Notes (Signed)
[facility] DEPT Provider Note   CSN: [id number] Arrival date & time: 03/18/17  2127     History   Chief Complaint Chief Complaint  Patient presents with  . Abdominal Distension    HPI Kathy [name] is a 61 y.o. female.  Patient with a history of atrial fibrillation, T2DM, HTN, HLD, constipation presents with complaint of abdominal distention and intermittent LUQ abdominal discomfort. She denies nausea, vomiting. She continue to pass gas per rectum. No abdominal surgeries in the past. She reports constipation worse than usual with small, hard bowel movements despite the use of MOM, "a stimulant laxative". Current symptoms have been ongoing for approximately 1 month. She states that when she does have pain, evan a small bowel movement seems to relieve the discomfort.    The history is provided by the patient. No language interpreter was used.    Past Medical History:  Diagnosis Date  . Atrial fibrillation ([place]) 05/2016  . Diabetes mellitus without complication ([place])    type II  . History of Helicobacter pylori infection   . Hyperlipidemia   . Hypertension   . Microalbuminuria 2016  . Obesity     Patient Active Problem List   Diagnosis Date Noted  . Constipation 11/18/2016  . Insomnia 11/18/2016  . Gum disease 11/18/2016  . Atrial fibrillation ([place]) 07/19/2016  . Encounter for health maintenance examination in adult 07/19/2016  . Special screening for malignant neoplasms, colon 07/19/2016  . Screening for cervical cancer 07/19/2016  . Post-menopausal 07/19/2016  . Estrogen deficiency 07/19/2016  . Vaccine counseling 07/19/2016  . Need for prophylactic vaccination and inoculation against influenza 05/24/2016  . Microalbuminuria 03/13/2014  . Type 2 diabetes mellitus with complication, without long-term current use of insulin ([place]) 08/06/2013  . Hyperlipidemia 08/06/2013  . Essential hypertension, benign 08/06/2013    Past Surgical History:  Procedure  Laterality Date  . COLONOSCOPY     age 62yo    OB History    No data available       Home Medications    Prior to Admission medications   Medication Sig Start Date End Date Taking? Authorizing Provider  chlorthalidone (HYGROTON) 25 MG tablet Take 1 tablet (25 mg total) by mouth daily. 03/14/17   Tysinger, Camelia Eng, PA-C  diltiazem (CARDIZEM) 30 MG tablet Cardizem 30mg  -- take 1 tablet every 4 hours AS NEEDED for heart rate >100 as long as blood pressure >100. Patient not taking: Reported on 11/18/2016 05/26/16   Sherran Needs, NP  diltiazem (CARTIA XT) 180 MG 24 hr capsule Take 1 capsule (180 mg total) by mouth daily. 11/23/16   Tysinger, Camelia Eng, PA-C  losartan (COZAAR) 100 MG tablet Take 1 tablet (100 mg total) by mouth daily. 03/14/17   Tysinger, Camelia Eng, PA-C  rivaroxaban (XARELTO) 20 MG TABS tablet TAKE 1 TABLET BY MOUTH DAILY WITH SUPPER. 11/23/16   Tysinger, Camelia Eng, PA-C  Saxagliptin-Metformin (KOMBIGLYZE XR) 2.01-999 MG TB24 Take 2 tablets by mouth daily. 03/14/17   Tysinger, Camelia Eng, PA-C  simvastatin (ZOCOR) 20 MG tablet Take 0.5 tablets (10 mg total) by mouth daily. 11/23/16   Tysinger, Camelia Eng, PA-C    Family History Family History  Problem Relation Age of Onset  . Stroke Mother   . Cancer Father        throat  . Diabetes Sister   . Diabetes Sister   . Diabetes Sister   . Cancer Sister        breast  . Heart  disease Neg Hx     Social History Social History  Substance Use Topics  . Smoking status: Never Smoker  . Smokeless tobacco: Never Used  . Alcohol use Yes     Comment: rarely     Allergies   Patient has no known allergies.   Review of Systems Review of Systems  Constitutional: Negative for appetite change, chills, diaphoresis and fever.  Respiratory: Negative.  Negative for shortness of breath.   Cardiovascular: Negative.  Negative for chest pain.  Gastrointestinal: Positive for abdominal pain and constipation. Negative for nausea and vomiting.    Musculoskeletal: Negative.   Skin: Negative.   Neurological: Negative.  Negative for weakness.     Physical Exam Updated Vital Signs BP (!) 185/97   Pulse 94   Temp 98.6 F (37 C) (Oral)   Resp (!) 21   Ht 5\' 8"  (1.727 m)   Wt 104.3 kg (230 lb)   SpO2 98%   BMI 34.97 kg/m   Physical Exam  Constitutional: She is oriented to person, place, and time. She appears well-developed and well-nourished.  HENT:  Head: Normocephalic.  Neck: Normal range of motion. Neck supple.  Cardiovascular: Normal rate and regular rhythm.   Pulmonary/Chest: Effort normal and breath sounds normal. She has no wheezes. She has no rales. She exhibits no tenderness.  Abdominal: Soft. Bowel sounds are normal. There is no tenderness. There is no rebound and no guarding.  There is no reproducible tenderness to palpation of abdomen, specifically no LUQ tenderness.   Musculoskeletal: Normal range of motion.  Neurological: She is alert and oriented to person, place, and time.  Skin: Skin is warm and dry. No rash noted.  Psychiatric: She has a normal mood and affect.     ED Treatments / Results  Labs (all labs ordered are listed, but only abnormal results are displayed) Labs Reviewed  LIPASE, BLOOD - Abnormal; Notable for the following:       Result Value   Lipase 75 (*)    All other components within normal limits  COMPREHENSIVE METABOLIC PANEL - Abnormal; Notable for the following:    Glucose, Bld 163 (*)    Creatinine, Ser 1.11 (*)    GFR calc non Af Amer 53 (*)    All other components within normal limits  URINALYSIS, ROUTINE W REFLEX MICROSCOPIC - Abnormal; Notable for the following:    APPearance HAZY (*)    Protein, ur 100 (*)    Squamous Epithelial / LPF 0-5 (*)    All other components within normal limits  CBC  I-STAT TROPOININ, ED    EKG  EKG Interpretation None       Radiology Dg Chest 2 View  Result Date: 03/18/2017 CLINICAL DATA:  Chest pain.  Abdominal distention. EXAM:  CHEST  2 VIEW COMPARISON:  None. FINDINGS: The cardiomediastinal contours are normal. The lungs are clear. Pulmonary vasculature is normal. No consolidation, pleural effusion, or pneumothorax. No acute osseous abnormalities are seen. Mild degenerative change in the spine. IMPRESSION: No acute pulmonary process. Electronically Signed   By: Jeb Levering M.D.   On: 03/18/2017 22:07    Procedures Procedures (including critical care time)  Medications Ordered in ED Medications - No data to display   Initial Impression / Assessment and Plan / ED Course  I have reviewed the triage vital signs and the nursing notes.  Pertinent labs & imaging results that were available during my care of the patient were reviewed by me and considered in  my medical decision making (see chart for details).     Patient here for evaluation of LUQ abdominal pain and constipation. Symptoms x 1 month. No fever, vomiting, history of obstruction.   The patient is very well appearing. Abdominal exam is benign. No chest pain, SOB, diaphoresis, cough or fever to cause concern for cardiopulmonary etiology. Troponin negative.   She has a history of constipation with symptoms familiar to her as those with her previous constipation. Imaging corroborates diagnosis.   She can be discharged home with regimen to relieve constipation including high dose Miralax, Fleet enema and close PCP follow up.  Final Clinical Impressions(s) / ED Diagnoses   Final diagnoses:  Constipation    New Prescriptions New Prescriptions   No medications on file     Dennie Bible 03/18/17 2333    Fredia Sorrow, MD 03/24/17 [phone]

## 2017-03-18 NOTE — ED Notes (Signed)
Patient transported to X-ray 

## 2017-03-18 NOTE — ED Notes (Signed)
Pt understood dc material. NAD noted. 

## 2017-03-18 NOTE — ED Triage Notes (Signed)
Pt presents C/O abd distension, constipation X several weeks, also endorses pain underneath L breast. Pt states pain comes and goes, sharp in nature. Poor historian.

## 2017-04-13 MED FILL — CHLORTHALIDONE 25 MG TABLET: 25 | 30 days supply | Qty: 30 | Fill #1

## 2017-04-13 MED FILL — LOSARTAN POTASSIUM 100 MG T: 100 | 30 days supply | Qty: 30 | Fill #1

## 2017-05-06 ENCOUNTER — Telehealth: Payer: Self-pay

## 2017-05-06 NOTE — Telephone Encounter (Signed)
Pt called to see if she can get samples of her DM medications d/t not having insurance at this time. Please advise. [phone]

## 2017-05-08 NOTE — Telephone Encounter (Signed)
Find out when she expects to get insurance again?  What happened to lose the insurance?  Depending upon the time frame, I may need to change some of her medications to cheaper options.   Give samples of whatever we have for the time being.

## 2017-05-09 ENCOUNTER — Other Ambulatory Visit: Payer: Self-pay

## 2017-05-09 MED ORDER — SAXAGLIPTIN-METFORMIN ER 2.5-1000 MG PO TB24: 1.0000 | ORAL_TABLET | ORAL | 0 refills | Status: DC

## 2017-05-09 NOTE — Telephone Encounter (Signed)
Called and spoke with pt she said that she will come by and p/u samples.

## 2017-05-13 MED FILL — LOSARTAN POTASSIUM 100 MG T: 100 | 30 days supply | Qty: 30 | Fill #2

## 2017-05-13 MED FILL — CHLORTHALIDONE 25 MG TABLET: 25 | 30 days supply | Qty: 30 | Fill #2

## 2017-05-13 MED FILL — ACETAMINOPHEN/COD #3 TABLET: 300-30 | 3 days supply | Qty: 12 | Fill #0

## 2017-05-13 MED FILL — AMOXICILLIN 500 MG CAPSULE: 500 | 7 days supply | Qty: 21 | Fill #0

## 2017-09-30 ENCOUNTER — Other Ambulatory Visit: Payer: Self-pay

## 2017-09-30 ENCOUNTER — Telehealth: Payer: Self-pay | Admitting: Medical

## 2017-09-30 MED ORDER — SAXAGLIPTIN-METFORMIN ER 2.5-1000 MG PO TB24: 1.0000 | ORAL_TABLET | ORAL | 0 refills | Status: DC

## 2017-09-30 NOTE — Telephone Encounter (Signed)
You asked me about this.   Have you already handled this?

## 2017-09-30 NOTE — Telephone Encounter (Signed)
Called and spoke with to let her know we can send enough meds to last her to appt. Send # 30 to pharmacy

## 2017-09-30 NOTE — Telephone Encounter (Signed)
Pt called and scheduled an appt for 10/11/2017. She lost her insurance and has not been in since 11/18/2016. While she was with out insurance she went to an urgent care and they changed her medications. I received notes and am sending back for review.  Pt only has two doses of diabetic medication left. The urgent care will not refill. She is requesting samples to last until her visit. SHE WILL BE OUT OF MEDS BEFORE HER OFFICE VISIT. Please advise pt at [phone].

## 2017-10-11 ENCOUNTER — Ambulatory Visit (INDEPENDENT_AMBULATORY_CARE_PROVIDER_SITE_OTHER): Payer: Self-pay | Admitting: Medical

## 2017-10-11 ENCOUNTER — Telehealth: Payer: Self-pay | Admitting: Medical

## 2017-10-11 VITALS — BP 134/70 | HR 71 | Wt 226.0 lb

## 2017-10-11 DIAGNOSIS — R809 Proteinuria, unspecified: Secondary | ICD-10-CM

## 2017-10-11 DIAGNOSIS — I10 Essential (primary) hypertension: Secondary | ICD-10-CM

## 2017-10-11 DIAGNOSIS — E118 Type 2 diabetes mellitus with unspecified complications: Secondary | ICD-10-CM

## 2017-10-11 DIAGNOSIS — E785 Hyperlipidemia, unspecified: Secondary | ICD-10-CM

## 2017-10-11 LAB — POCT GLYCOSYLATED HEMOGLOBIN (HGB A1C): Hemoglobin A1C: 10.2

## 2017-10-11 MED ORDER — SIMVASTATIN 20 MG PO TABS: 10.0000 mg | ORAL_TABLET | Freq: Every day | ORAL | 1 refills | Status: DC

## 2017-10-11 MED ORDER — OLMESARTAN MEDOXOMIL 20 MG PO TABS: 20.0000 mg | ORAL_TABLET | Freq: Every day | ORAL | 1 refills | Status: DC

## 2017-10-11 MED ORDER — SITAGLIP PHOS-METFORMIN HCL ER 50-1000 MG PO TB24: 1.0000 | ORAL_TABLET | Freq: Every day | ORAL | 1 refills | Status: DC

## 2017-10-11 MED ORDER — GLIPIZIDE 5 MG PO TABS: 5.0000 mg | ORAL_TABLET | Freq: Every day | ORAL | 1 refills | Status: DC

## 2017-10-11 MED ORDER — ATENOLOL-CHLORTHALIDONE 50-25 MG PO TABS: 1.0000 | ORAL_TABLET | Freq: Every day | ORAL | 1 refills | Status: DC

## 2017-10-11 NOTE — Telephone Encounter (Signed)
Pt would like for all her meds to be sent to [facility]. She will be calling [facility] today to set up an account/profile.

## 2017-10-11 NOTE — Progress Notes (Signed)
Subjective: Chief Complaint  Patient presents with  . Diabetes    dm follow up    Last visit here was March 2018.  Here today for med check.  Since last visit here she ended up seeing [facility] practice for a while as her husband has switched insurance.  Here for diabetes follow-up.  She knows the medicine she was last on through our office was working well until she had to switch medications due to insurance.  She is currently taking metformin 500 mg twice a day, glipizide 5 mg daily.  She is checking blood sugars and usually getting in the 150s.  No recent foot concerns.  She reports that she is active, exercising some, eating healthy.  Hypertension-compliant with olmesartan 20 mg daily and atenolol/chlorthalidone 50/25 mg daily.  Hyperlipidemia-compliant with Zocor 20 mg, 1/2 tablet daily.  Last year she had problems with atrial fibrillation, but has not had palpitations or issues with this in recent months.  Past Medical History:  Diagnosis Date  . Atrial fibrillation ([place]) 05/2016  . Diabetes mellitus without complication ([place])    type II  . History of Helicobacter pylori infection   . Hyperlipidemia   . Hypertension   . Microalbuminuria 2016  . Obesity    Current Outpatient Medications on File Prior to Visit  Medication Sig Dispense Refill  . OLMESARTAN MEDOXOMIL PO Take 1 tablet by mouth once.    . Saxagliptin-Metformin (KOMBIGLYZE XR) 2.01-999 MG TB24 Take 1 tablet by mouth 1 day or 1 dose for 1 dose. 30 tablet 0   No current facility-administered medications on file prior to visit.    ROS as in subjective   Objective: BP 134/70   Pulse 71   Wt 226 lb (102.5 kg)   SpO2 97%   BMI 34.36 kg/m   General appearance: alert, no distress, WD/WN,  Neck: supple, no lymphadenopathy, no thyromegaly, no masses, no bruits Heart: RRR, normal S1, S2, no murmurs Lungs: CTA bilaterally, no wheezes, rhonchi, or rales Abdomen: +bs, soft, non tender, non distended, no masses,  no hepatomegaly, no splenomegaly Pulses: 2+ symmetric, upper and lower extremities, normal cap refill Ext: no edema  Diabetic Foot Exam - Simple   Simple Foot Form Diabetic Foot exam was performed with the following findings:  Yes 10/11/2017  8:34 PM  Visual Inspection See comments:  Yes Sensation Testing Intact to touch and monofilament testing bilaterally:  Yes Pulse Check See comments:  Yes Comments 1+ pedal pulses, somewhat mildly thickened toenails bilaterally      Assessment: Encounter Diagnoses  Name Primary?  . Type 2 diabetes mellitus with complication, without long-term current use of insulin ([place]) Yes  . Essential hypertension, benign   . Hyperlipidemia, unspecified hyperlipidemia type   . Microalbuminuria     Plan: She was not sure about her insurance situation today so we did not do labs purposely today to limit expenses.  Hemoglobin A1c not at goal.  She was on Kombiglyze last year and it worked well but this is not currently covered by her insurance plan..  We will begin trial of Janumet instead, continue glipizide for now, cautioned on hypoglycemia.  Continue daily foot checks, glucose monitoring, healthy diet and exercise.  Plan to recheck in 3 months  Hypertension, hyperlipidemia -continue same medications  I reviewed her lab results from her last visit 11/2016.  We will need to do labs next visit.   Kathy [name] was seen today for diabetes.  Diagnoses and all orders for this visit:  Type 2 diabetes mellitus with complication, without long-term current use of insulin ([facility]) -     HgB A1c  Essential hypertension, benign  Hyperlipidemia, unspecified hyperlipidemia type  Microalbuminuria  Other orders -     Cancel: Hemoglobin A1c -     atenolol-chlorthalidone (TENORETIC) 50-25 MG tablet; Take 1 tablet by mouth daily. -     olmesartan (BENICAR) 20 MG tablet; Take 1 tablet (20 mg total) by mouth daily. -     simvastatin (ZOCOR) 20 MG tablet; Take 0.5 tablets  (10 mg total) by mouth daily. -     SitaGLIPtin-MetFORMIN HCl (JANUMET XR) 50-1000 MG TB24; Take 1 tablet by mouth daily. -     glipiZIDE (GLUCOTROL) 5 MG tablet; Take 1 tablet (5 mg total) by mouth daily before breakfast.

## 2017-10-11 NOTE — Patient Instructions (Signed)
Recommendations:  Please call your insurance to find out where your medications should be sent  Do your routine medications need to be sent 90 day supply to [facility], or can you use local pharmacy for 30 or 90 day supply?  Since you are out of medications now, will your insurance allow 30 day supply at local pharmacy and 90 day supply to [facility]

## 2017-10-11 NOTE — Telephone Encounter (Signed)
Please let her know that I continued all of her medications except for metformin.  I changed her to Janumet 1 tablet daily (to replace metformin ) and continue glipizide for diabetes as well.  She will need to continue checking her sugars as glipizide can actually lower her sugar too low below 70.  Continue to try and eat a healthy diet and exercise regularly.  I would like to see her back in 3 months to recheck and to do additional blood work.  Have her check insurance coverage for labs for next visit.  I sent all of her medicines to [facility] for 90-day supply.  However if she also needs 30-day supply sent to local pharmacy let me know ASAP.

## 2017-10-12 ENCOUNTER — Other Ambulatory Visit: Payer: Self-pay

## 2017-10-12 MED ORDER — ATENOLOL-CHLORTHALIDONE 50-25 MG PO TABS: 1.0000 | ORAL_TABLET | Freq: Every day | ORAL | 1 refills | Status: DC

## 2017-10-12 MED ORDER — SITAGLIP PHOS-METFORMIN HCL ER 50-1000 MG PO TB24: 1.0000 | ORAL_TABLET | Freq: Every day | ORAL | 1 refills | Status: DC

## 2017-10-12 MED ORDER — GLIPIZIDE 5 MG PO TABS: 5.0000 mg | ORAL_TABLET | Freq: Every day | ORAL | 1 refills | Status: DC

## 2017-10-12 MED ORDER — OLMESARTAN MEDOXOMIL 20 MG PO TABS: 20.0000 mg | ORAL_TABLET | Freq: Every day | ORAL | 1 refills | Status: DC

## 2017-10-12 NOTE — Telephone Encounter (Signed)
Pt called back to inform of what local pharmacy she would like to use. [facility] on [address]. Pt can be reached at [phone].

## 2017-10-12 NOTE — Telephone Encounter (Signed)
Called and notified of information about meds,made her an appt. To come in 3 months , she said that she call her insurance company and they are mailing out a new card for her.

## 2017-10-12 NOTE — Telephone Encounter (Signed)
Sent refill to [facility]

## 2017-12-19 ENCOUNTER — Encounter: Payer: Self-pay | Admitting: Medical

## 2017-12-19 ENCOUNTER — Ambulatory Visit (INDEPENDENT_AMBULATORY_CARE_PROVIDER_SITE_OTHER): Payer: BLUE CROSS/BLUE SHIELD | Admitting: Medical

## 2017-12-19 VITALS — BP 130/88 | HR 63 | Ht 69.0 in | Wt 226.8 lb

## 2017-12-19 DIAGNOSIS — Z7189 Other specified counseling: Secondary | ICD-10-CM | POA: Diagnosis not present

## 2017-12-19 DIAGNOSIS — E785 Hyperlipidemia, unspecified: Secondary | ICD-10-CM | POA: Diagnosis not present

## 2017-12-19 DIAGNOSIS — R809 Proteinuria, unspecified: Secondary | ICD-10-CM

## 2017-12-19 DIAGNOSIS — E118 Type 2 diabetes mellitus with unspecified complications: Secondary | ICD-10-CM

## 2017-12-19 DIAGNOSIS — L84 Corns and callosities: Secondary | ICD-10-CM | POA: Diagnosis not present

## 2017-12-19 DIAGNOSIS — I10 Essential (primary) hypertension: Secondary | ICD-10-CM

## 2017-12-19 DIAGNOSIS — Z7185 Encounter for immunization safety counseling: Secondary | ICD-10-CM

## 2017-12-19 MED ORDER — SITAGLIP PHOS-METFORMIN HCL ER 50-1000 MG PO TB24: 1.0000 | ORAL_TABLET | Freq: Every day | ORAL | 1 refills | Status: DC

## 2017-12-19 MED ORDER — OLMESARTAN MEDOXOMIL 20 MG PO TABS: 20.0000 mg | ORAL_TABLET | Freq: Every day | ORAL | 1 refills | Status: DC

## 2017-12-19 MED ORDER — GLIPIZIDE 5 MG PO TABS: 5.0000 mg | ORAL_TABLET | Freq: Every day | ORAL | 1 refills | Status: DC

## 2017-12-19 NOTE — Progress Notes (Signed)
Subjective:   Kathy [name] is an 62 y.o. female who presents for follow up of Type 2 diabetes mellitus.   Patient is checking home blood sugars.   Home blood sugar records: usually under 120 Current symptoms include: none Patient denies hyperglycemia, hypoglycemia , paresthesia of the feet, polydipsia, polyuria and visual disturbances.  Patient is checking their feet daily. Foot concerns (callous, ulcer, wound, thickened nails, toenail fungus, skin fungus, hammer toe): has callus right great toe Last dilated eye exam about 2 years ago. Current treatments:  Glipizide 5mg  daily Sitagliptin Metfromin 50/1000mg  XR daily  Medication compliance: good  Current diet: in general, a "healthy" diet   Current exercise: walking 3 days per week Known diabetic complications: microabluminuria  HTN - compliant with Atenolol Chlorthalidone 50/25mg  daily and Benicar 20mg  daily.  Not checking BPs.  Hyperlipidemia - compliant with Zocor 20mg  daily  The following portions of the patient's history were reviewed and updated as appropriate: allergies, current medications, past family history, past medical history, past social history, past surgical history and problem list.  ROS as in subjective above  Past Medical History:  Diagnosis Date  . Atrial fibrillation ([place]) 05/2016  . Diabetes mellitus without complication ([place])    type II  . History of Helicobacter pylori infection   . Hyperlipidemia   . Hypertension   . Microalbuminuria 2016  . Obesity    Current Outpatient Medications on File Prior to Visit  Medication Sig Dispense Refill  . atenolol-chlorthalidone (TENORETIC) 50-25 MG tablet Take 1 tablet by mouth daily. 90 tablet 1  . simvastatin (ZOCOR) 20 MG tablet Take 0.5 tablets (10 mg total) by mouth daily. 90 tablet 1   No current facility-administered medications on file prior to visit.    ROS as in subjective     Objective:   BP 130/88 (BP Location: Right Arm, Patient  Position: Sitting, Cuff Size: Normal)   Pulse 63   Ht 5\' 9"  (1.753 m)   Wt 226 lb 12.8 oz (102.9 kg)   SpO2 96%   BMI 33.49 kg/m   Wt Readings from Last 3 Encounters:  12/19/17 226 lb 12.8 oz (102.9 kg)  10/11/17 226 lb (102.5 kg)  03/18/17 230 lb (104.3 kg)   BP Readings from Last 3 Encounters:  12/19/17 130/88  10/11/17 134/70  03/18/17 (!) 167/87    General appearance: alert, no distress, WD/WN,  Oral cavity: MMM, no lesions Neck: supple, no lymphadenopathy, no thyromegaly, no masses Heart: RRR, normal S1, S2, no murmurs Lungs: CTA bilaterally, no wheezes, rhonchi, or rales Ext: no edema Pulses: 2+ symmetric, upper and lower extremities, normal cap refill  Diabetic Foot Exam - Simple   Simple Foot Form Diabetic Foot exam was performed with the following findings:  Yes 12/19/2017  9:12 AM  Visual Inspection See comments:  Yes Sensation Testing Intact to touch and monofilament testing bilaterally:  Yes Pulse Check Posterior Tibialis and Dorsalis pulse intact bilaterally:  Yes Comments Right volar great toe with thickened callus       Assessment:   Encounter Diagnoses  Name Primary?  . Type 2 diabetes mellitus with complication, without long-term current use of insulin ([place]) Yes  . Essential hypertension, benign   . Hyperlipidemia, unspecified hyperlipidemia type   . Microalbuminuria   . Vaccine counseling   . Callus of foot      Plan:   Diabetes Mellitus type 2: Education: Reviewed 'ABCs' of diabetes management (respective goals in parentheses):  A1C (<7), blood pressure (<130/80), and  cholesterol (LDL <100)   Diabetes mellitus Type II, under good control.   Improving but not at goal.  Compliance at present is estimated to be good. Efforts to improve compliance (if necessary) will be directed at dietary modifications: as discussed.    Blood pressure: stable.   An ACE/ARB is currently part of their treatment regimen.   Dyslipidemia under good control.  .  A statin is currently part of their treatment regimen.   Discussed general issues about diabetes pathophysiology and management. Neurosurgeon distributed. Suggested low cholesterol diet. Encouraged aerobic exercise. Discussed foot care. Reminded to get yearly retinal exam.    Callus - c/t foot soaks, add pumice stone grinding of the callus lightly until improving  Shingles vaccine:  I recommend you have a shingles vaccine to help prevent shingles or herpes zoster outbreak.   Please call your insurer to inquire about coverage for the Shingrix vaccine given in 2 doses.   Some insurers cover this vaccine after age 8, some cover this after age 69.  If your insurer covers this, then call to schedule appointment to have this vaccine here.   Follow up: 3 months

## 2017-12-19 NOTE — Patient Instructions (Signed)
Encounter Diagnoses  Name Primary?  . Type 2 diabetes mellitus with complication, without long-term current use of insulin ([place]) Yes  . Essential hypertension, benign   . Hyperlipidemia, unspecified hyperlipidemia type   . Microalbuminuria   . Vaccine counseling   . Callus of foot     Type 2 Diabetes  Diabetes is a long-lasting (chronic) disease.  With diabetes, either the pancreas does not make enough of a hormone called insulin, or the body has trouble using the insulin that is made.  Over time, diabetes can damage the eyes, kidneys, and nerves causing retinopathy, nephropathy, and neuropathy.  Diabetes puts you at risk for heart disease and peripheral vascular disease which can lead to heart attack, stroke, foot ulcers, and amputations.    Our goal and hopefully your goal is to manage your diabetes in such a way to slow the progression of the disease and do all we can to keep you healthy  Home Care:   Eat healthy, exercise regularly, limit alcohol, and don't smoke!  Check your blood sugar (glucose) once a day before breakfast, or as indicated by our discussion today.  Take your medications daily, don't run out of medications.  Learn about low blood sugar (hypoglycemia). Know how to treat it.  Wear a necklace or bracelet that says you have diabetes.  Check your feet every night for cuts, sores, blisters, and redness. Tell your medical provider if you have problems.  Maintain a normal body weight, or normal BMI - height to weight ratio of 20-25.  Ask me about this.  GET HELP RIGHT AWAY IF:  You have trouble keeping your blood sugar in target range.  You have problems with your medicines.  You are sick and not getting better after 24 hours.  You have a sore or wound that is not healing.  You have vision problems or changes.  You have a fever.  Diet:  I recommend a healthy diet.    Do's:   whole grains such as whole grain pasta, rice, whole grains breads and whole  grain cereals.  Use small quantities such as 1/2 cup per serving or 2 slices of bread per serving.    Eat 3-5 fruits daily  Eat beans at least once daily  Eat almonds in small quantities at least 3 days per week    If they eat meat, I recommend small portions of lean meats such as chicken, fish, and [place].  Eat as much NON corn and NON potato vegetables as they like, particularly raw or steamed  Drink several large glasses of water daily  Cautions:  Limit red meat  Limit corn and potatoes  Limit sweets, cake, pie, candy  Limit beer and alcohol  Avoid fried food, fast food, large portions  Avoid sugary drinks such as regular soda and sweet tea   Exercise regularly since it has beneficial effects on the heart and blood sugars. Exercising at least three times per week or 150 minutes per week can be as important as medication to a diabetic.  Find some form of exercise that you will enjoy doing regularly.  This can include walking, biking, kayaking, golfing, swimming, dance, aerobics, hiking, etc.  If you have joint problems, many local gyms have equipment to accommodate people with specific needs.    Vaccinations:  Diabetics are at increased risk for infection, and illnesses can take longer to resolve.  Current vaccine recommendations include yearly Influenza (flu) vaccine (recommended in October), Pneumococcal vaccine, Hepatitis B vaccine series,  Tdap (tetanus, diptheria and pertussis) vaccine every 10 years, and other age appropriate vaccinations.     Office visits:  We recommend routine medical care to make sure we are addressing prevention and issues as they arise.  Typically this could mean twice yearly or up to quarterly depending upon your unique health situation.  Exams should include a yearly physical, a yearly foot exam, and other examination as appropriate.  You should see an eye doctor yearly to help screen for and prevent blood vessel complications in your eyes.  Labs:  Diabetics should have blood work done at least twice yearly to monitor your Hemoglobin A1C (a three-month average of your blood sugars) and your cholesterol.  You should have your urine and blood checked yearly to screen for kidney damage.  This may include creatinine and micro-albumin levels.  Other labs as appropriate.    Blood pressure goals:  Goal blood pressure in diabetics should be 130/80 or less. Monitoring your blood pressure with a home blood pressure cuff of your own is an excellent idea.  If you are prescribed medication for blood pressure, take your medication every day, and don't run out of medication.  Having high blood pressure can damage your heart, eyes, kidneys, and put you at risk for heart attack and stroke.  Tobacco use:  If you smoke, dip or chew, quitting will reduce your risk of heart attack, stroke, peripheral vascular disease, and many cancers.    Diabetic Report Card for Kathy [name]  December 19, 2017  Below is a summary of recent tests related to your diabetes that can help you manage your health.   Hemoglobin A1C:  Your Hemoglobin A1C values should be less than 7. If these are greater than 7, you have a higher chance of having eye, heart, and kidney problems in the future.   Your most recent Hemoglobin A1C values were:  Your HgbA1C was 8% today.  Improved!  We are getting there, but need to be under 7   Cholesterol:  Your LDL Cholesterol (bad cholesterol) values should be less than 100 mg/dL if you do not have cardiovascular disease.  The LDL should be less than 70 mg/dL if you do have cardiovascular disease.  If your LDL is consistently higher than 100 mg/dL, then your risk of heart attack and stroke increases yearly.   Your most recent LDL Cholesterol (bad cholesterol) results were:  LDL Cholesterol (mg/dL)  Date Value  05/24/2016 88  12/17/2014 87  11/06/2013 90     Your HDL Cholesterol (good cholesterol) values should be higher than 40 mg/dL.  If your  HDL is lower than 40 mg/dL, this increases your risk of heart attack and stroke.     Blood Pressure:  Your blood pressure values should be less than 130/80. Please contact me if your readings at home are consistently higher than this.   Your most recent blood pressure readings at our clinic were:  BP Readings from Last 3 Encounters:  12/19/17 130/88  10/11/17 134/70  03/18/17 (!) 167/87    Urine Protein: Having an elevated microalbumin to creatinine ratio is a marker for early kidney damage due to diabetes or high blood pressure.     Your most recent urine protein results were:  Elevated.   Stay on current medication   Other recommendations Shingles vaccine:  I recommend you have a shingles vaccine to help prevent shingles or herpes zoster outbreak.   Please call your insurer to inquire about coverage for the Shingrix  vaccine given in 2 doses.   Some insurers cover this vaccine after age 6, some cover this after age 75.  If your insurer covers this, then call to schedule appointment to have this vaccine here.

## 2017-12-20 ENCOUNTER — Encounter: Payer: Self-pay | Admitting: Medical

## 2017-12-20 ENCOUNTER — Other Ambulatory Visit: Payer: Self-pay

## 2017-12-20 ENCOUNTER — Other Ambulatory Visit: Payer: Self-pay | Admitting: Medical

## 2017-12-20 ENCOUNTER — Telehealth: Payer: Self-pay

## 2017-12-20 DIAGNOSIS — R809 Proteinuria, unspecified: Secondary | ICD-10-CM

## 2017-12-20 LAB — MICROALBUMIN / CREATININE URINE RATIO
CREATININE, UR: 54.5 mg/dL
MICROALBUM., U, RANDOM: 923.7 ug/mL
Microalb/Creat Ratio: 1694.9 mg/g creat — ABNORMAL HIGH (ref 0.0–30.0)

## 2017-12-20 LAB — LIPID PANEL
CHOLESTEROL TOTAL: 213 mg/dL — AB (ref 100–199)
Chol/HDL Ratio: 2.6 ratio (ref 0.0–4.4)
HDL: 83 mg/dL (ref >39)
LDL Calculated: 109 mg/dL — ABNORMAL HIGH (ref 0–99)
Triglycerides: 104 mg/dL (ref 0–149)
VLDL Cholesterol Cal: 21 mg/dL (ref 5–40)

## 2017-12-20 LAB — POCT GLYCOSYLATED HEMOGLOBIN (HGB A1C): Hemoglobin A1C: 8.3

## 2017-12-20 LAB — COMPREHENSIVE METABOLIC PANEL
A/G RATIO: 1.2 (ref 1.2–2.2)
ALK PHOS: 104 IU/L (ref 39–117)
ALT: 10 IU/L (ref 0–32)
AST: 10 IU/L (ref 0–40)
Albumin: 4 g/dL (ref 3.6–4.8)
BUN/Creatinine Ratio: 23 (ref 12–28)
BUN: 31 mg/dL — ABNORMAL HIGH (ref 8–27)
CHLORIDE: 103 mmol/L (ref 96–106)
CO2: 20 mmol/L (ref 20–29)
Calcium: 10.2 mg/dL (ref 8.7–10.3)
Creatinine, Ser: 1.37 mg/dL — ABNORMAL HIGH (ref 0.57–1.00)
GFR calc non Af Amer: 42 mL/min/{1.73_m2} — ABNORMAL LOW (ref >59)
GFR, EST AFRICAN AMERICAN: 48 mL/min/{1.73_m2} — AB (ref >59)
GLUCOSE: 139 mg/dL — AB (ref 65–99)
Globulin, Total: 3.3 g/dL (ref 1.5–4.5)
POTASSIUM: 4.9 mmol/L (ref 3.5–5.2)
Sodium: 139 mmol/L (ref 134–144)
TOTAL PROTEIN: 7.3 g/dL (ref 6.0–8.5)

## 2017-12-20 MED ORDER — ATENOLOL-CHLORTHALIDONE 50-25 MG PO TABS: 1.0000 | ORAL_TABLET | Freq: Every day | ORAL | 1 refills | Status: DC

## 2017-12-20 MED ORDER — ROSUVASTATIN CALCIUM 20 MG PO TABS: 20.0000 mg | ORAL_TABLET | Freq: Every day | ORAL | 1 refills | Status: DC

## 2017-12-20 NOTE — Telephone Encounter (Signed)
Called patient, no voicemail to leave message. Will try again.

## 2017-12-20 NOTE — Telephone Encounter (Signed)
-----   Message from Carlena Hurl, PA-C sent at 12/20/2017  8:13 AM EDT ----- Her labs show kidney marker slightly worse than last time, cholesterol too high, still pending another test called the microalbumin.  Rest of labs are okay.   I changed her cholesterol medicine to Crestor for better potency to lower her bad cholesterol.  As we discussed yesterday I really want her to work hard on keeping a healthy low-carb diet, exercising regularly.     I would like to refer her to nephrology/kidney doctor for consult on protein in the urine and abnormal kidney function.  The change in her kidney function over time is likely due to blood pressure and diabetes but would like them to consult to rule out other issues.  Follow up fasting in 3 months.  Kathy [name] - send referral to [facility] along with notes, labs from today as well as last microalbumin and last 2 urinalysis)

## 2018-03-13 ENCOUNTER — Telehealth: Payer: Self-pay | Admitting: Medical

## 2018-03-13 ENCOUNTER — Ambulatory Visit (INDEPENDENT_AMBULATORY_CARE_PROVIDER_SITE_OTHER): Payer: BLUE CROSS/BLUE SHIELD | Admitting: Medical

## 2018-03-13 VITALS — BP 120/74 | HR 55 | Temp 98.4°F | Resp 16 | Ht 69.5 in | Wt 218.8 lb

## 2018-03-13 DIAGNOSIS — Z1211 Encounter for screening for malignant neoplasm of colon: Secondary | ICD-10-CM

## 2018-03-13 DIAGNOSIS — E118 Type 2 diabetes mellitus with unspecified complications: Secondary | ICD-10-CM | POA: Diagnosis not present

## 2018-03-13 DIAGNOSIS — R300 Dysuria: Secondary | ICD-10-CM

## 2018-03-13 DIAGNOSIS — J069 Acute upper respiratory infection, unspecified: Secondary | ICD-10-CM | POA: Diagnosis not present

## 2018-03-13 DIAGNOSIS — E785 Hyperlipidemia, unspecified: Secondary | ICD-10-CM

## 2018-03-13 DIAGNOSIS — I10 Essential (primary) hypertension: Secondary | ICD-10-CM | POA: Diagnosis not present

## 2018-03-13 DIAGNOSIS — Z1231 Encounter for screening mammogram for malignant neoplasm of breast: Secondary | ICD-10-CM

## 2018-03-13 DIAGNOSIS — R21 Rash and other nonspecific skin eruption: Secondary | ICD-10-CM

## 2018-03-13 DIAGNOSIS — R809 Proteinuria, unspecified: Secondary | ICD-10-CM

## 2018-03-13 DIAGNOSIS — Z1239 Encounter for other screening for malignant neoplasm of breast: Secondary | ICD-10-CM

## 2018-03-13 DIAGNOSIS — L989 Disorder of the skin and subcutaneous tissue, unspecified: Secondary | ICD-10-CM | POA: Insufficient documentation

## 2018-03-13 LAB — POCT URINALYSIS DIP (PROADVANTAGE DEVICE)
Bilirubin, UA: NEGATIVE
GLUCOSE UA: NEGATIVE mg/dL
Ketones, POC UA: NEGATIVE mg/dL
Leukocytes, UA: NEGATIVE
Nitrite, UA: POSITIVE — AB
Protein Ur, POC: 100 mg/dL — AB
SPECIFIC GRAVITY, URINE: 1.02
Urobilinogen, Ur: 3.5
pH, UA: 5.5 (ref 5.0–8.0)

## 2018-03-13 MED ORDER — SULFAMETHOXAZOLE-TRIMETHOPRIM 800-160 MG PO TABS: 1.0000 | ORAL_TABLET | Freq: Two times a day (BID) | ORAL | 0 refills | Status: DC

## 2018-03-13 MED ORDER — PREDNISONE 20 MG PO TABS: ORAL_TABLET | ORAL | 0 refills | Status: DC

## 2018-03-13 MED ORDER — TRIAMCINOLONE ACETONIDE 0.1 % EX CREA: 1.0000 "application " | TOPICAL_CREAM | Freq: Two times a day (BID) | CUTANEOUS | 0 refills | Status: DC

## 2018-03-13 MED ORDER — SULFAMETHOXAZOLE-TRIMETHOPRIM 800-160 MG PO TABS
1.0000 | ORAL_TABLET | Freq: Two times a day (BID) | ORAL | 0 refills | Status: DC
Start: 2018-03-13 — End: 2018-03-13

## 2018-03-13 NOTE — Progress Notes (Signed)
Subjective: Chief Complaint  Patient presents with  . follow up     follow lup diabetes  sugar this am 140  rash on arms itchy   Here for f/u.     Diabetes-she is compliant with Janumet but says she does not think she is taking glipizide.  Numbers have been looking good until she got sick this past week.  They have been running a little higher the last few days.  No foot lesions.  No polyuria no polydipsia no weight changes.  Hyperlipidemia- last visit we changed to Crestor.  She is tolerating this just fine in general she says she is eating pretty healthy and exercising  Hypertension-she is compliant with atenolol-chlorthalidone 50/25 mg daily, olmesartan 20 mg daily.  No chest pain or difficulty breathing no swelling  She has a new rash that started about a week ago.  Has bumps throughout her arms little bit on her legs.  None on her torso hands soles or face.  The rash is itchy.  She has been using hydrocortisone cream with some improvement.  She has been eating more tomatoes of late.  No other new trigger    She has some cold symptoms that started last several days.  Husband has been sick with the same.  She has cough, congestion, some chills.  No fever, no body aches, no wheezing, no sore throat.  Last visit we made a referral to nephrology for microalbuminuria and elevated creatinine.  Her appointment is coming up this month.  Dysuria-last few days been feeling some lower abdominal pressure and discomfort with urination   Past Medical History:  Diagnosis Date  . Atrial fibrillation ([place]) 05/2016  . Diabetes mellitus without complication ([place])    type II  . History of Helicobacter pylori infection   . Hyperlipidemia   . Hypertension   . Microalbuminuria 2016  . Obesity    Current Outpatient Medications on File Prior to Visit  Medication Sig Dispense Refill  . atenolol-chlorthalidone (TENORETIC) 50-25 MG tablet Take 1 tablet by mouth daily. 90 tablet 1  . glipiZIDE  (GLUCOTROL) 5 MG tablet Take 1 tablet (5 mg total) by mouth daily before breakfast. 90 tablet 1  . olmesartan (BENICAR) 20 MG tablet Take 1 tablet (20 mg total) by mouth daily. 90 tablet 1  . rosuvastatin (CRESTOR) 20 MG tablet Take 1 tablet (20 mg total) by mouth at bedtime. 90 tablet 1  . SitaGLIPtin-MetFORMIN HCl (JANUMET XR) 50-1000 MG TB24 Take 1 tablet by mouth daily. 90 tablet 1   No current facility-administered medications on file prior to visit.    ROS as in subjective   Objective: BP 120/74   Pulse (!) 55   Temp 98.4 F (36.9 C) (Oral)   Resp 16   Ht 5' 9.5" (1.765 m)   Wt 218 lb 12.8 oz (99.2 kg)   SpO2 99%   BMI 31.85 kg/m   BP Readings from Last 3 Encounters:  03/13/18 120/74  12/19/17 130/88  10/11/17 134/70   Wt Readings from Last 3 Encounters:  03/13/18 218 lb 12.8 oz (99.2 kg)  12/19/17 226 lb 12.8 oz (102.9 kg)  10/11/17 226 lb (102.5 kg)   General appearence: alert, no distress, WD/WN, AA HEENT: normocephalic, sclerae anicteric, TMs pearly, nares patent, no discharge or erythema, pharynx normal Oral cavity: MMM, no lesions Neck: supple, no lymphadenopathy, no thyromegaly, no masses Heart: RRR, normal S1, S2, no murmurs Lungs: CTA bilaterally, no wheezes, rhonchi, or rales Abdomen: +bs, soft, mild  suprapubic tenderness, otherwise non tender, non distended, no masses, no hepatomegaly, no splenomegaly Pulses: 2+ symmetric, upper and lower extremities, normal cap refill Ext: no edema Skin: Bilateral arms with diffuse brown slightly raised maculopapular rash, brownish lesions scattered, few small ones on the left arm, no other rash     Assessment: Encounter Diagnoses  Name Primary?  . Type 2 diabetes mellitus with complication, without long-term current use of insulin ([place]) Yes  . Dysuria   . Essential hypertension, benign   . Hyperlipidemia, unspecified hyperlipidemia type   . Microalbuminuria   . Rash   . Upper respiratory tract infection,  unspecified type   . Screening for breast cancer   . Screen for colon cancer     Plan: Diabetes-routine labs today, for whatever reason she did not realize she was supposed to be taking glipizide, continue Janumet, discussed foot care, glucometer testing, counseled on diet and exercise  Dysuria-likely UTI, urine culture sent, begin Bactrim  Hypertension-continue current medication  Hyperlipidemia-labs today since we changed to Crestor  Microalbuminuria, hypertension, diabetes, continue plan see nephrology later this month  Rash- likely allergic reaction vs insensitivity to foods such as the increased tomatoes she has been eating.   Begin short-term 3 to 5 days of Benadryl at night, short-term prednisone prescription today and topical triamcinolone  Reminded her to go for mammogram  I reviewed Cologuard notation from a few months ago to have inadequate specimen.  We will re-refer for updated test   Ariyan was seen today for follow up.  Diagnoses and all orders for this visit:  Type 2 diabetes mellitus with complication, without long-term current use of insulin ([facility]) -     Hemoglobin A1c  Dysuria -     POCT Urinalysis DIP (Proadvantage Device) -     Urine Culture  Essential hypertension, benign  Hyperlipidemia, unspecified hyperlipidemia type -     Lipid panel  Microalbuminuria  Rash  Upper respiratory tract infection, unspecified type  Screening for breast cancer  Screen for colon cancer  Other orders -     Discontinue: sulfamethoxazole-trimethoprim (BACTRIM DS,SEPTRA DS) 800-160 MG tablet; Take 1 tablet by mouth 2 (two) times daily. -     Discontinue: predniSONE (DELTASONE) 20 MG tablet; 3 tablets today, 2 tablets tomorrow, 1 tablet the third day -     Discontinue: predniSONE (DELTASONE) 20 MG tablet; 3 tablets today, 2 tablets tomorrow, 1 tablet the third day -     Discontinue: sulfamethoxazole-trimethoprim (BACTRIM DS,SEPTRA DS) 800-160 MG tablet; Take 1 tablet  by mouth 2 (two) times daily. -     triamcinolone cream (KENALOG) 0.1 %; Apply 1 application topically 2 (two) times daily. -     sulfamethoxazole-trimethoprim (BACTRIM DS,SEPTRA DS) 800-160 MG tablet; Take 1 tablet by mouth 2 (two) times daily. -     predniSONE (DELTASONE) 20 MG tablet; 3 tablets today, 2 tablets tomorrow, 1 tablet the third day

## 2018-03-13 NOTE — Telephone Encounter (Signed)
Please send referral again for Cologuard.  Something happened the last time she tried this and not enough sample was submitted

## 2018-03-13 NOTE — Telephone Encounter (Signed)
Faxed Order to [employer] at [phone].

## 2018-03-14 ENCOUNTER — Other Ambulatory Visit: Payer: Self-pay | Admitting: Medical

## 2018-03-14 ENCOUNTER — Telehealth: Payer: Self-pay

## 2018-03-14 ENCOUNTER — Telehealth: Payer: Self-pay | Admitting: Medical

## 2018-03-14 ENCOUNTER — Other Ambulatory Visit: Payer: Self-pay

## 2018-03-14 DIAGNOSIS — E118 Type 2 diabetes mellitus with unspecified complications: Secondary | ICD-10-CM

## 2018-03-14 DIAGNOSIS — Z1239 Encounter for other screening for malignant neoplasm of breast: Secondary | ICD-10-CM

## 2018-03-14 LAB — LIPID PANEL
CHOL/HDL RATIO: 2 ratio (ref 0.0–4.4)
Cholesterol, Total: 164 mg/dL (ref 100–199)
HDL: 83 mg/dL (ref >39)
LDL CALC: 61 mg/dL (ref 0–99)
TRIGLYCERIDES: 100 mg/dL (ref 0–149)
VLDL Cholesterol Cal: 20 mg/dL (ref 5–40)

## 2018-03-14 LAB — HEMOGLOBIN A1C
Est. average glucose Bld gHb Est-mCnc: 171 mg/dL
HEMOGLOBIN A1C: 7.6 % — AB (ref 4.8–5.6)

## 2018-03-14 MED ORDER — BLOOD GLUCOSE MONITOR KIT: PACK | 0 refills | Status: AC

## 2018-03-14 MED ORDER — INSULIN DEGLUDEC 100 UNIT/ML ~~LOC~~ SOPN: 10.0000 [IU] | PEN_INJECTOR | Freq: Every day | SUBCUTANEOUS | 2 refills | Status: DC

## 2018-03-14 MED ORDER — ROSUVASTATIN CALCIUM 20 MG PO TABS: 20.0000 mg | ORAL_TABLET | Freq: Every day | ORAL | 1 refills | Status: DC

## 2018-03-14 MED ORDER — OLMESARTAN MEDOXOMIL 20 MG PO TABS: 20.0000 mg | ORAL_TABLET | Freq: Every day | ORAL | 1 refills | Status: DC

## 2018-03-14 MED ORDER — ATENOLOL-CHLORTHALIDONE 50-25 MG PO TABS: 1.0000 | ORAL_TABLET | Freq: Every day | ORAL | 1 refills | Status: DC

## 2018-03-14 MED ORDER — INSULIN GLARGINE 300 UNIT/ML ~~LOC~~ SOPN: 10.0000 [IU] | PEN_INJECTOR | Freq: Every day | SUBCUTANEOUS | 2 refills | Status: DC

## 2018-03-14 MED ORDER — BASAGLAR KWIKPEN 100 UNIT/ML ~~LOC~~ SOPN: 10.0000 [IU] | PEN_INJECTOR | Freq: Every day | SUBCUTANEOUS | 2 refills | Status: DC

## 2018-03-14 NOTE — Telephone Encounter (Signed)
I resent for 9ml.    Have her begin this, also call in glucometer so she can check sugars daily in the morning for now and write these down.   I want to see her back in 2 weeks to see how she is tolerating the injection daily and review sugar numbers

## 2018-03-14 NOTE — Telephone Encounter (Signed)
Patient came into the office for training on giving herself insulin injections and to check her glucose.   Patient troujeo 10 units at bed time.  Patient seem to tolerate well and understand how to check blood sugar.    We do need to send in the troujeo 10 Unit to her mail order pharmacy.

## 2018-03-14 NOTE — Telephone Encounter (Signed)
Kathy [name] @ [facility] called stating that the Tresiba 3 ml script that was sent in comes in 15 ml boxes so pharmacy is not able to break the box down in smaller quantities. Does Kathy [name] want to change the script to 15 ml? If so, it can be resent electronically or called in.

## 2018-03-14 NOTE — Telephone Encounter (Signed)
Received a voicemail from pharmacy that the [place] is more expensive it is $1300 with patient insurance.   Please advise on what you want try next.   Thank you

## 2018-03-15 LAB — URINE CULTURE

## 2018-03-15 NOTE — Progress Notes (Signed)
Left message for pt

## 2018-03-17 NOTE — Telephone Encounter (Signed)
Pt called back for test results and they were given. Advised issue with cost of Toujeo and someone would be back in touch with her regarding this.  She also has some questions regarding injecting herself with the insulin.  Please call her back

## 2018-03-17 NOTE — Telephone Encounter (Signed)
Pt has been informed of the correct way to administer her insulin using the insulin pen.

## 2018-03-29 ENCOUNTER — Telehealth: Payer: Self-pay | Admitting: Medical

## 2018-03-29 ENCOUNTER — Ambulatory Visit (INDEPENDENT_AMBULATORY_CARE_PROVIDER_SITE_OTHER): Payer: BLUE CROSS/BLUE SHIELD | Admitting: Medical

## 2018-03-29 VITALS — BP 140/80 | HR 69 | Temp 98.3°F | Resp 16 | Ht 70.0 in | Wt 219.2 lb

## 2018-03-29 DIAGNOSIS — E118 Type 2 diabetes mellitus with unspecified complications: Secondary | ICD-10-CM | POA: Diagnosis not present

## 2018-03-29 DIAGNOSIS — E785 Hyperlipidemia, unspecified: Secondary | ICD-10-CM | POA: Diagnosis not present

## 2018-03-29 DIAGNOSIS — I10 Essential (primary) hypertension: Secondary | ICD-10-CM

## 2018-03-29 MED ORDER — LINAGLIPTIN 5 MG PO TABS: 5.0000 mg | ORAL_TABLET | Freq: Every day | ORAL | 0 refills | Status: DC

## 2018-03-29 NOTE — Patient Instructions (Addendum)
Your kidney marker in recent months has declined a little bit which is likely due to the effects of high blood pressure and diabetes  Last visit I wanted to stop glipizide, Janumet, and change you to long-acting insulin at nighttime.  Per our discussion today you would rather not go that route currently  Here are my new recommendations:  Since you just got refills from mail order on the Janumet and glipizide, let us continue these 2 medicines until you run out  When you run out of Janumet I want to switch you to Tradjenta 5 mg once daily  I gave you 28 days worth of samples of Tradjenta today.  Do not start this until you run out of Janumet  We will continue glipizide 5 mg daily  If your kidney doctor decides to make other recommendations on your diabetes medicines, then we will look at that make adjustments  Continue to monitor your sugars particularly when you change to the Tradjenta, I want to see what they look like  Continue your blood pressure and cholesterol medicines as usual  For now we will stop the nighttime insulin injection  Avoid anti-inflammatories such as Ibuprofen, Advil, Aleve, Motrin as these can harm your kidneys  If you have pain, use Tylenol instead   Please plan to follow up in 3 months or so when you transition to the [place]

## 2018-03-29 NOTE — Telephone Encounter (Signed)
Please contact [facility] and discontinue Janumet  Once I finished my office note today please send a copy of my office note to [facility]

## 2018-03-29 NOTE — Progress Notes (Signed)
Subjective: Chief Complaint  Patient presents with  . follow up    follow up dm fasting    Here for f/u.      Diabetes- I saw her on July 1 for med check.  At that visit I want her to begin [place] once daily long-acting insulin and we decided to stop Janumet XR.  In July her A1c was 7.6% improved from prior visits  Last visit we really sent Cologuard referral as last year specimen was inadequate  Last visit her lipid panel was much improved with the change in medication.  compliant with Crestor 20 mg daily  Hypertension-she is compliant with atenolol-chlorthalidone 50/25 mg daily, olmesartan 20 mg daily.  No chest pain or difficulty breathing no swelling   Past Medical History:  Diagnosis Date  . Atrial fibrillation ([place]) 05/2016  . Diabetes mellitus without complication ([place])    type II  . History of Helicobacter pylori infection   . Hyperlipidemia   . Hypertension   . Microalbuminuria 2016  . Obesity    Current Outpatient Medications on File Prior to Visit  Medication Sig Dispense Refill  . atenolol-chlorthalidone (TENORETIC) 50-25 MG tablet Take 1 tablet by mouth daily. 90 tablet 1  . blood glucose meter kit and supplies KIT Dispense based on patient and insurance preference. Use up to four times daily as directed. (FOR ICD-9 250.00, 250.01). 1 each 0  . glipiZIDE (GLUCOTROL) 5 MG tablet Take 5 mg by mouth daily before breakfast.    . olmesartan (BENICAR) 20 MG tablet Take 1 tablet (20 mg total) by mouth daily. 90 tablet 1  . rosuvastatin (CRESTOR) 20 MG tablet Take 1 tablet (20 mg total) by mouth at bedtime. 90 tablet 1  . sitaGLIPtin-metformin (JANUMET) 50-1000 MG tablet Take 1 tablet by mouth 2 (two) times daily with a meal.    . triamcinolone cream (KENALOG) 0.1 % Apply 1 application topically 2 (two) times daily. 30 g 0  . Insulin Glargine (BASAGLAR KWIKPEN) 100 UNIT/ML SOPN Inject 0.1 mLs (10 Units total) into the skin at bedtime. (Patient not taking: Reported on  03/29/2018) 15 mL 2  . Insulin Glargine (TOUJEO SOLOSTAR) 300 UNIT/ML SOPN Inject 10 Units into the skin at bedtime. (Patient not taking: Reported on 03/29/2018) 15 mL 2   No current facility-administered medications on file prior to visit.    ROS as in subjective   Objective: BP 140/80   Pulse 69   Temp 98.3 F (36.8 C) (Oral)   Resp 16   Ht _0  (1.778 m)   Wt 219 lb 3.2 oz (99.4 kg)   SpO2 98%   BMI 31.45 kg/m   BP Readings from Last 3 Encounters:  03/29/18 140/80  03/13/18 120/74  12/19/17 130/88   Wt Readings from Last 3 Encounters:  03/29/18 219 lb 3.2 oz (99.4 kg)  03/13/18 218 lb 12.8 oz (99.2 kg)  12/19/17 226 lb 12.8 oz (102.9 kg)   General appearance: alert, no distress, WD/WN, AA    Assessment: Encounter Diagnoses  Name Primary?  . Type 2 diabetes mellitus with complication, without long-term current use of insulin ([place]) Yes  . Essential hypertension, benign   . Hyperlipidemia, unspecified hyperlipidemia type     Plan: Diabetes- it appears that she is very apprehensive about going on long-acting insulin.  I discussed again today that her kidney function is declined which dictates what medications we can use sometimes.  Last visit we initially prescribed Tyler Aas which was declined by insurance then  Toujeo which was also declined by insurance, then [employer] which was also declined by insurance.  At this point we will just hold off on long-acting insulin continue glipizide and when she runs out of Janumet in 3 months she will switch to Tradjenta along with continuing glipizide lower dose 5 mg daily  Patient Instructions  Your kidney marker in recent months has declined a little bit which is likely due to the effects of high blood pressure and diabetes  Last visit I wanted to stop glipizide, Janumet, and change you to long-acting insulin at nighttime.  Per our discussion today you would rather not go that route currently  Here are my new  recommendations:  Since you just got refills from mail order on the Janumet and glipizide, let us continue these 2 medicines until you run out  When you run out of Janumet I want to switch you to Tradjenta 5 mg once daily  I gave you 28 days worth of samples of Tradjenta today.  Do not start this until you run out of Janumet  We will continue glipizide 5 mg daily  If your kidney doctor decides to make other recommendations on your diabetes medicines, then we will look at that make adjustments  Continue to monitor your sugars particularly when you change to the Tradjenta, I want to see what they look like  Continue your blood pressure and cholesterol medicines as usual  For now we will stop the nighttime insulin injection  Avoid anti-inflammatories such as Ibuprofen, Advil, Aleve, Motrin as these can harm your kidneys  If you have pain, use Tylenol instead   Please plan to follow up in 3 months or so when you transition to the [place]      Hypertension-continue current medication  Hyperlipidemia- continue Crestor  Microalbuminuria, hypertension, diabetes, continue plan see nephrology later this month  Still pending Cologuard results  Ludie was seen today for follow up.  Diagnoses and all orders for this visit:  Type 2 diabetes mellitus with complication, without long-term current use of insulin (HCC)  Essential hypertension, benign  Hyperlipidemia, unspecified hyperlipidemia type

## 2018-03-29 NOTE — Telephone Encounter (Signed)
I called Kathy [name] and discontinued Janumet.   I also called [facility] to [facility] and they told me to just cancel RX through the local pharmacy.

## 2018-03-29 NOTE — Telephone Encounter (Signed)
[facility] does not have an account.  I will try to figure it out.

## 2018-04-11 ENCOUNTER — Ambulatory Visit
Admission: RE | Admit: 2018-04-11 | Discharge: 2018-04-11 | Disposition: A | Discharge status: Home / Self Care | Payer: BLUE CROSS/BLUE SHIELD | Source: Ambulatory Visit | Attending: Medical | Admitting: Medical

## 2018-04-11 DIAGNOSIS — Z1239 Encounter for other screening for malignant neoplasm of breast: Secondary | ICD-10-CM

## 2018-04-12 ENCOUNTER — Other Ambulatory Visit (HOSPITAL_COMMUNITY): Payer: Self-pay | Admitting: Internal Medicine

## 2018-04-12 ENCOUNTER — Encounter: Payer: Self-pay | Admitting: Medical

## 2018-04-12 DIAGNOSIS — N183 Chronic kidney disease, stage 3 unspecified: Secondary | ICD-10-CM

## 2018-04-12 LAB — COLOGUARD: Cologuard: NEGATIVE

## 2018-04-17 ENCOUNTER — Ambulatory Visit (HOSPITAL_COMMUNITY)
Admission: RE | Admit: 2018-04-17 | Discharge: 2018-04-17 | Disposition: A | Discharge status: Home / Self Care | Payer: BLUE CROSS/BLUE SHIELD | Source: Ambulatory Visit | Attending: Internal Medicine | Admitting: Internal Medicine

## 2018-04-17 DIAGNOSIS — N183 Chronic kidney disease, stage 3 unspecified: Secondary | ICD-10-CM

## 2018-04-17 DIAGNOSIS — N281 Cyst of kidney, acquired: Secondary | ICD-10-CM | POA: Diagnosis not present

## 2018-04-17 DIAGNOSIS — N2889 Other specified disorders of kidney and ureter: Secondary | ICD-10-CM | POA: Insufficient documentation

## 2018-04-20 ENCOUNTER — Telehealth: Payer: Self-pay | Admitting: Medical

## 2018-04-20 NOTE — Telephone Encounter (Signed)
Please let them know that the Cologuard screening for colon cancer was negative.  This indicates a lower likelihood that colorectal cancer is present.   Lets plan to repeat this in 3 years.  However, if the develop bowel changes, blood in stool, unexpected weight loss, or other new bowel changes, then recheck. 

## 2018-04-20 NOTE — Telephone Encounter (Signed)
Patient notified of lab results

## 2018-05-10 ENCOUNTER — Other Ambulatory Visit: Payer: Self-pay | Admitting: Internal Medicine

## 2018-05-10 DIAGNOSIS — N281 Cyst of kidney, acquired: Secondary | ICD-10-CM

## 2018-05-23 ENCOUNTER — Ambulatory Visit
Admission: RE | Admit: 2018-05-23 | Discharge: 2018-05-23 | Disposition: A | Discharge status: Home / Self Care | Payer: BLUE CROSS/BLUE SHIELD | Source: Ambulatory Visit | Attending: Internal Medicine | Admitting: Internal Medicine

## 2018-05-23 DIAGNOSIS — N281 Cyst of kidney, acquired: Secondary | ICD-10-CM

## 2018-05-30 ENCOUNTER — Other Ambulatory Visit: Payer: Self-pay | Admitting: Internal Medicine

## 2018-05-30 DIAGNOSIS — N281 Cyst of kidney, acquired: Secondary | ICD-10-CM

## 2018-05-31 ENCOUNTER — Other Ambulatory Visit: Payer: Self-pay | Admitting: Internal Medicine

## 2018-05-31 DIAGNOSIS — D219 Benign neoplasm of connective and other soft tissue, unspecified: Secondary | ICD-10-CM

## 2018-06-02 ENCOUNTER — Ambulatory Visit
Admission: RE | Admit: 2018-06-02 | Discharge: 2018-06-02 | Disposition: A | Discharge status: Home / Self Care | Payer: BLUE CROSS/BLUE SHIELD | Source: Ambulatory Visit | Attending: Internal Medicine | Admitting: Internal Medicine

## 2018-06-02 DIAGNOSIS — D219 Benign neoplasm of connective and other soft tissue, unspecified: Secondary | ICD-10-CM

## 2018-06-08 ENCOUNTER — Telehealth: Payer: Self-pay

## 2018-06-08 NOTE — Telephone Encounter (Signed)
Patient states that she has a cyst on her kidney and wants to know what medication she is suppose to be taking.  She is confused on what to take for DM.

## 2018-06-12 NOTE — Telephone Encounter (Signed)
Make appt for med check and review imaging

## 2018-06-16 NOTE — Telephone Encounter (Signed)
Appt made for 06/20/18

## 2018-06-20 ENCOUNTER — Encounter: Payer: Self-pay | Admitting: Medical

## 2018-06-20 ENCOUNTER — Ambulatory Visit (INDEPENDENT_AMBULATORY_CARE_PROVIDER_SITE_OTHER): Payer: BLUE CROSS/BLUE SHIELD | Admitting: Medical

## 2018-06-20 VITALS — BP 130/80 | HR 58 | Temp 98.2°F | Resp 16 | Ht 70.0 in | Wt 212.8 lb

## 2018-06-20 DIAGNOSIS — D259 Leiomyoma of uterus, unspecified: Secondary | ICD-10-CM

## 2018-06-20 DIAGNOSIS — E118 Type 2 diabetes mellitus with unspecified complications: Secondary | ICD-10-CM | POA: Diagnosis not present

## 2018-06-20 DIAGNOSIS — E785 Hyperlipidemia, unspecified: Secondary | ICD-10-CM

## 2018-06-20 DIAGNOSIS — E279 Disorder of adrenal gland, unspecified: Secondary | ICD-10-CM

## 2018-06-20 DIAGNOSIS — N183 Chronic kidney disease, stage 3 unspecified: Secondary | ICD-10-CM

## 2018-06-20 DIAGNOSIS — Z23 Encounter for immunization: Secondary | ICD-10-CM | POA: Diagnosis not present

## 2018-06-20 DIAGNOSIS — N281 Cyst of kidney, acquired: Secondary | ICD-10-CM

## 2018-06-20 DIAGNOSIS — I10 Essential (primary) hypertension: Secondary | ICD-10-CM

## 2018-06-20 LAB — POCT GLYCOSYLATED HEMOGLOBIN (HGB A1C): HEMOGLOBIN A1C: 7 % — AB (ref 4.0–5.6)

## 2018-06-20 MED ORDER — METFORMIN HCL 500 MG PO TABS: 500.0000 mg | ORAL_TABLET | Freq: Every day | ORAL | 1 refills | Status: DC

## 2018-06-20 MED ORDER — GLIPIZIDE 5 MG PO TABS: 5.0000 mg | ORAL_TABLET | Freq: Every day | ORAL | 1 refills | Status: DC

## 2018-06-20 MED ORDER — LINAGLIPTIN 5 MG PO TABS: 5.0000 mg | ORAL_TABLET | Freq: Every day | ORAL | 1 refills | Status: DC

## 2018-06-20 NOTE — Progress Notes (Signed)
Subjective: Chief Complaint  Patient presents with  . med check    med check and discuss imaging reuslts    Here to discuss recent imaging.   (Of note we are having computer issues which delayed the visit quite a bit considering the whole point was to review imaging results).  At her last visit here we referred to nephrology for consult on microalbuminuria and abnormal creatinine in the setting of diabetes, prior proteinuria on urine test.  After seeing nephrologist she was sent for imaging and apparently the imaging shows some cyst in the kidneys.  She has been referred by nephrology to urology and reportedly has an appointment with Dr. Tresa Moore on Monday.  She has a history of uterine fibroids long-standing without any current symptoms no bleeding no pelvic pain.  Last visit because of the kidney issues we wanted to get her off Janumet and transition to [place] which she is doing.  She is also taking glipizide 5 mg daily.  She currently is not taking Janumet or metformin.  We tried to transition to injectable medicine 2 visits ago but she did not tolerate self injection and wants to avoid that if at all possible currently.  Recent blood sugars are running 130-140 fasting.  She is exercising, trying to maintain a healthy diet.  Past Medical History:  Diagnosis Date  . Atrial fibrillation ([place]) 05/2016  . Diabetes mellitus without complication ([place])    type II  . History of Helicobacter pylori infection   . Hyperlipidemia   . Hypertension   . Microalbuminuria 2016  . Obesity    Current Outpatient Medications on File Prior to Visit  Medication Sig Dispense Refill  . atenolol-chlorthalidone (TENORETIC) 50-25 MG tablet Take 1 tablet by mouth daily. 90 tablet 1  . blood glucose meter kit and supplies KIT Dispense based on patient and insurance preference. Use up to four times daily as directed. (FOR ICD-9 250.00, 250.01). 1 each 0  . olmesartan (BENICAR) 20 MG tablet Take 1 tablet (20 mg  total) by mouth daily. 90 tablet 1  . rosuvastatin (CRESTOR) 20 MG tablet Take 1 tablet (20 mg total) by mouth at bedtime. 90 tablet 1  . triamcinolone cream (KENALOG) 0.1 % Apply 1 application topically 2 (two) times daily. 30 g 0   No current facility-administered medications on file prior to visit.    ROS as in subjective    Objective: BP 130/80   Pulse (!) 58   Temp 98.2 F (36.8 C) (Oral)   Resp 16   Ht 5' 10"  (1.778 m)   Wt 212 lb 12.8 oz (96.5 kg)   SpO2 97%   BMI 30.53 kg/m   Wt Readings from Last 3 Encounters:  06/20/18 212 lb 12.8 oz (96.5 kg)  03/29/18 219 lb 3.2 oz (99.4 kg)  03/13/18 218 lb 12.8 oz (99.2 kg)   General appearence: alert, no distress, WD/WN,  Abdomen: +bs, soft, non tender, non distended, no masses, no hepatomegaly, no splenomegaly Pulses: 2+ symmetric, upper and lower extremities, normal cap refill Ext: no edema  MRI abdomen 05/23/18: IMPRESSION: 1. Interpolar right renal lesion is indeterminate on this noncontrast exam. Although this could represent a complex cyst, partially solid partially cystic renal neoplasm is a concern. If the patient can undergo pre and postcontrast CT, this is suggested. If not, this could be re-evaluated with ultrasound or noncontrast MRI at 6-12 months. 2. Left adrenal nodule is indeterminate. This could also be further evaluated with dedicated adrenal protocol CT  pre and post contrast CT or followed with abdominal MRI at 6-12 months. 3. **An incidental finding of potential clinical significance has been found. Soft tissue fullness within the pelvis, incompletely imaged. Most likely related to uterine fibroids. Consider pelvic ultrasound.** 4. Mild hepatomegaly.  Pelvic US 06/03/18: IMPRESSION: Uterine fibroids, measuring up to 3.5 cm, as above.  Left ovary is not discretely visualized.  No adnexal mass is seen.   US renal 06/02/18 IMPRESSION: 1. Slight increased renal parenchyma echogenicity bilaterally  may reflect changes of medical renal disease. 2. Right renal 1.8 x 1.5 x 1.8 cm slightly complex cyst containing septation and internal echoes. This is not appreciated on prior ultrasound. This can be further assessed with MR (can be performed without contrast if patient not able to receive contrast secondary to poor renal function).    Assessment: Encounter Diagnoses  Name Primary?  . Type 2 diabetes mellitus with complication, without long-term current use of insulin ([place]) Yes  . Need for influenza vaccination   . Lesion of adrenal gland ([place])   . Renal cyst   . Uterine leiomyoma, unspecified location   . Essential hypertension, benign   . Chronic kidney disease (CKD), stage III (moderate) (HCC)   . Hyperlipidemia, unspecified hyperlipidemia type      Plan: We discussed the findings on her recent ultrasound and MR of the kidneys.  She notes that nephrology referred her to urology and has appointment next week with Dr. Tresa Moore to discuss next steps, likely biopsy versus  Diabetes- she has not tolerated metformin at higher doses due to GI side effects, certainly did not like using needle with a short trial of injectable medication.  Currently she is relatively controlled with glipizide and Tradjenta.  We made those changes last visit.  She is agreeable to add metformin 500 mg once daily, a lower dose than previously not tolerated.  Reiterated the need for careful diet, regular exercise.  Follow-up in 3 months  CKD 3 -  reviewed recent chronic kidney notes from 04/06/2018 visit, appreciate nephrology consult  Hypertension- continue atenolol/chlorthalidone 50/25 mg daily, continue telmisartan 20 mg daily.  I reviewed the recent chronic kidney notes from 04/06/2018 visit.  Dr. Johnney Ou recommended pushing the ARB dose if blood pressure not at goal depending on potassium level  Counseled on the influenza virus vaccine.  Vaccine information sheet given.  Influenza vaccine given after consent  obtained.  Senita was seen today for med check.  Diagnoses and all orders for this visit:  Type 2 diabetes mellitus with complication, without long-term current use of insulin (HCC) -     HgB A1c  Need for influenza vaccination -     Flu Vaccine QUAD 6+ mos PF IM (Fluarix Quad PF)  Lesion of adrenal gland (HCC)  Renal cyst  Uterine leiomyoma, unspecified location  Essential hypertension, benign  Chronic kidney disease (CKD), stage III (moderate) (HCC)  Hyperlipidemia, unspecified hyperlipidemia type  Other orders -     glipiZIDE (GLUCOTROL) 5 MG tablet; Take 1 tablet (5 mg total) by mouth daily before breakfast. -     linagliptin (TRADJENTA) 5 MG TABS tablet; Take 1 tablet (5 mg total) by mouth daily. -     metFORMIN (GLUCOPHAGE) 500 MG tablet; Take 1 tablet (500 mg total) by mouth daily with breakfast.

## 2018-09-29 ENCOUNTER — Telehealth: Payer: Self-pay | Admitting: Family Medicine

## 2018-09-29 NOTE — Telephone Encounter (Signed)
Pt called she states been out of metformin for several weeks.  She states mail order said order was cancelled.  Pt had one refill.  I told her I would call refill into mail order and she said to send to [facility].  She made an appt for 2 weeks out for diabetes ov.  She said meds not working well.   I told her I could not increase her dosage that she would need to talk with Audelia Acton about that at her appt.

## 2018-10-11 ENCOUNTER — Ambulatory Visit: Payer: BC Managed Care – PPO | Admitting: Medical

## 2018-10-31 ENCOUNTER — Encounter: Payer: Self-pay | Admitting: Medical

## 2018-10-31 ENCOUNTER — Ambulatory Visit (INDEPENDENT_AMBULATORY_CARE_PROVIDER_SITE_OTHER): Payer: PRIVATE HEALTH INSURANCE | Admitting: Medical

## 2018-10-31 VITALS — BP 130/80 | HR 62 | Temp 98.1°F | Resp 16 | Ht 68.0 in | Wt 217.6 lb

## 2018-10-31 DIAGNOSIS — E785 Hyperlipidemia, unspecified: Secondary | ICD-10-CM

## 2018-10-31 DIAGNOSIS — I10 Essential (primary) hypertension: Secondary | ICD-10-CM | POA: Diagnosis not present

## 2018-10-31 DIAGNOSIS — N281 Cyst of kidney, acquired: Secondary | ICD-10-CM | POA: Diagnosis not present

## 2018-10-31 DIAGNOSIS — M79673 Pain in unspecified foot: Secondary | ICD-10-CM

## 2018-10-31 DIAGNOSIS — Z7189 Other specified counseling: Secondary | ICD-10-CM

## 2018-10-31 DIAGNOSIS — Z7185 Encounter for immunization safety counseling: Secondary | ICD-10-CM

## 2018-10-31 DIAGNOSIS — N183 Chronic kidney disease, stage 3 unspecified: Secondary | ICD-10-CM

## 2018-10-31 DIAGNOSIS — E118 Type 2 diabetes mellitus with unspecified complications: Secondary | ICD-10-CM | POA: Diagnosis not present

## 2018-10-31 DIAGNOSIS — R809 Proteinuria, unspecified: Secondary | ICD-10-CM

## 2018-10-31 LAB — CBC
HEMOGLOBIN: 11.9 g/dL (ref 11.1–15.9)
Hematocrit: 36 % (ref 34.0–46.6)
MCH: 26 pg — AB (ref 26.6–33.0)
MCHC: 33.1 g/dL (ref 31.5–35.7)
MCV: 79 fL (ref 79–97)
PLATELETS: 281 10*3/uL (ref 150–450)
RBC: 4.57 x10E6/uL (ref 3.77–5.28)
RDW: 14 % (ref 11.7–15.4)
WBC: 7.3 10*3/uL (ref 3.4–10.8)

## 2018-10-31 LAB — POCT GLYCOSYLATED HEMOGLOBIN (HGB A1C): HEMOGLOBIN A1C: 10.4 % — AB (ref 4.0–5.6)

## 2018-10-31 LAB — COMPREHENSIVE METABOLIC PANEL
ALK PHOS: 103 IU/L (ref 39–117)
ALT: 11 IU/L (ref 0–32)
AST: 13 IU/L (ref 0–40)
Albumin/Globulin Ratio: 1.4 (ref 1.2–2.2)
Albumin: 4.1 g/dL (ref 3.8–4.8)
BUN/Creatinine Ratio: 25 (ref 12–28)
BUN: 30 mg/dL — ABNORMAL HIGH (ref 8–27)
Bilirubin Total: 0.3 mg/dL (ref 0.0–1.2)
CALCIUM: 9.8 mg/dL (ref 8.7–10.3)
CO2: 21 mmol/L (ref 20–29)
CREATININE: 1.19 mg/dL — AB (ref 0.57–1.00)
Chloride: 100 mmol/L (ref 96–106)
GFR calc Af Amer: 57 mL/min/{1.73_m2} — ABNORMAL LOW (ref >59)
GFR, EST NON AFRICAN AMERICAN: 49 mL/min/{1.73_m2} — AB (ref >59)
Globulin, Total: 3 g/dL (ref 1.5–4.5)
Glucose: 231 mg/dL — ABNORMAL HIGH (ref 65–99)
POTASSIUM: 4.7 mmol/L (ref 3.5–5.2)
SODIUM: 136 mmol/L (ref 134–144)
Total Protein: 7.1 g/dL (ref 6.0–8.5)

## 2018-10-31 MED ORDER — ROSUVASTATIN CALCIUM 20 MG PO TABS: 20.0000 mg | ORAL_TABLET | Freq: Every day | ORAL | 3 refills | Status: DC

## 2018-10-31 MED ORDER — OLMESARTAN MEDOXOMIL 20 MG PO TABS: 20.0000 mg | ORAL_TABLET | Freq: Every day | ORAL | 1 refills | Status: DC

## 2018-10-31 MED ORDER — METFORMIN HCL 500 MG PO TABS: 500.0000 mg | ORAL_TABLET | Freq: Two times a day (BID) | ORAL | 1 refills | Status: DC

## 2018-10-31 MED ORDER — GLIPIZIDE 5 MG PO TABS: 5.0000 mg | ORAL_TABLET | Freq: Every day | ORAL | 3 refills | Status: DC

## 2018-10-31 MED ORDER — LINAGLIPTIN 5 MG PO TABS: 5.0000 mg | ORAL_TABLET | Freq: Every day | ORAL | 0 refills | Status: DC

## 2018-10-31 MED ORDER — ATENOLOL-CHLORTHALIDONE 50-25 MG PO TABS: 1.0000 | ORAL_TABLET | Freq: Every day | ORAL | 3 refills | Status: DC

## 2018-10-31 NOTE — Progress Notes (Addendum)
Subjective:     Patient ID: Kathy [name], female   DOB: [birth year], 63 y.o.   MRN: [id number]  HPI Chief Complaint  Patient presents with  . DM check    DM check fasting, sugar running little high   Here for diabetic check.  Diabetes - here for f/u.  Not taking Tradjenta or Glipizide, says her mail order [facility] didn't sent the refills.  Has been out of both medication for a month.   Wants to switch back to [facility].    Current taking Metformin 53m BID.   Went back to BID per nephrology.  Typically doesn't eat breakfast.  Eats lunch and dinner, and dinner is usually 6pm.   Checking sugars fasting in monring.  Been running 200 on average.   Exercise - walks for 20 minutes or uses walking tape video.     HTN - compliant with Atenolol Chlorthalidone 50/277mdaily, Benicar 2081maily,   hyperlipidemia - compliatn with Crestor 59m67mily  No other aggravating or relieving factors. No other complaint.  Past Medical History:  Diagnosis Date  . Atrial fibrillation (HCC)[place]/2017  . Diabetes mellitus without complication (HCC)[place] type II  . History of Helicobacter pylori infection   . Hyperlipidemia   . Hypertension   . Microalbuminuria 2016  . Obesity     Review of Systems As in subjective     Objective:   Physical Exam BP 130/80   Pulse 62   Temp 98.1 F (36.7 C) (Oral)   Resp 16   Ht 5' 8"  (1.727 m)   Wt 217 lb 9.6 oz (98.7 kg)   SpO2 99%   BMI 33.09 kg/m   Wt Readings from Last 3 Encounters:  10/31/18 217 lb 9.6 oz (98.7 kg)  06/20/18 212 lb 12.8 oz (96.5 kg)  03/29/18 219 lb 3.2 oz (99.4 kg)   General appearance: alert, no distress, WD/WN, AA female Neck: supple, no lymphadenopathy, no thyromegaly, no masses, no bruits Heart: RRR, normal S1, S2, no murmurs Lungs: CTA bilaterally, no wheezes, rhonchi, or rales Pulses: 2+ symmetric upper ext, normal cap refill Ext: no edema  Diabetic Foot Exam - Simple   Simple Foot Form Diabetic Foot exam  was performed with the following findings:  Yes 10/31/2018 10:07 AM  Visual Inspection Sensation Testing Intact to touch and monofilament testing bilaterally:  Yes Pulse Check See comments:  Yes Comments 1+ pedal pulses, mild medial ankle tenderness just inferior to medial malleoli bilat         Assessment:     Encounter Diagnoses  Name Primary?  . Essential hypertension, benign Yes  . Type 2 diabetes mellitus with complication, without long-term current use of insulin (HCC)[place]. Chronic kidney disease (CKD), stage III (moderate) (HCC)   . Renal cyst   . Hyperlipidemia, unspecified hyperlipidemia type   . Microalbuminuria   . Vaccine counseling   . Pain of foot, unspecified laterality        Plan:     I refilled medications below but advised to call back or recheck within 1 month.  If blood sugars are nowhere near goal referral to diabetes specialist at that point  I reviewed her nephrology note from [facility] from visit April 20, 2018, and she has CKD 3 secondary to diabetes with 20-year history, albuminuria, hypertension, and was advised low-sodium diet, keep diabetes under control with A1c less than 7%, and they advised she could tolerate metformin 500 twice a  day at this time.  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  But apparently this also involved  Tradjenta and glipizide.  I will refill medications to [facility] at your request today but please do not run out of medications in the future  Continue to check your sugars every day fasting  The goal is to have your blood sugars 130 or less fasting  I recommend you eat small portions throughout the day, and also put some diet recommendations below  High blood pressure and high cholesterol  Continue your blood pressure and cholesterol medicines as usual   Vaccines Please call your insurance to see if they cover the Shingrix shingles vaccine and the Prevnar 13 vaccine is you need both of these   Continue exercise regularly  Caniyah was seen today for dm check.  Diagnoses and all orders for this visit:  Essential hypertension, benign -     Comprehensive metabolic panel -     CBC -     POCT glycosylated hemoglobin (Hb A1C)  Type 2 diabetes mellitus with complication, without long-term current use of insulin (HCC) -     Comprehensive metabolic panel -     CBC -     POCT glycosylated hemoglobin (Hb A1C)  Chronic kidney disease (CKD), stage III (moderate) (HCC)  Renal cyst  Hyperlipidemia, unspecified hyperlipidemia type  Microalbuminuria  Vaccine counseling  Pain of foot, unspecified laterality -     Uric acid  Other orders -     rosuvastatin (CRESTOR) 20 MG tablet; Take 1 tablet (20 mg total) by mouth at bedtime. -     olmesartan (BENICAR) 20 MG tablet; Take 1 tablet (20 mg total) by mouth daily. -     metFORMIN (GLUCOPHAGE) 500 MG tablet; Take 1 tablet (500 mg total) by mouth 2 (two) times daily with a meal. -     linagliptin (TRADJENTA) 5 MG TABS tablet; Take 1 tablet (5 mg total) by mouth daily. -     glipiZIDE (GLUCOTROL) 5 MG tablet; Take 1 tablet (5 mg total) by mouth daily before breakfast. -     atenolol-chlorthalidone (TENORETIC) 50-25 MG tablet; Take 1 tablet by mouth daily.

## 2018-10-31 NOTE — Addendum Note (Signed)
Addended by: Carlena Hurl on: 10/31/2018 11:47 AM   Modules accepted: Level of Service

## 2018-10-31 NOTE — Patient Instructions (Addendum)
Recommendations:  Diabetes  In the future I do not want you running out of medications  In the future please let me know if you are going to be out of something for more than a few days, or let me know if there is an insurance or pharmacy issue  When you called in January regarding [facility] not refilling your medicines, the notation was just involving metformin.  But apparently this also involved Tradjenta and glipizide.  I will refill medications to Kathy [name] at your request today but please do not run out of medications in the future  Continue to check your sugars every day fasting  The goal is to have your blood sugars 130 or less fasting  I recommend you eat small portions throughout the day, and also put some diet recommendations below  High blood pressure and high cholesterol  Continue your blood pressure and cholesterol medicines as usual   Vaccines Please call your insurance to see if they cover the Shingrix shingles vaccine and the Prevnar 13 vaccine is you need both of these   Continue exercise regularly  Lets change strategies and try something that may work better than what you are currently doing  I want you to eat 3 meals a day +2 snacks, one midmorning snack and one mid afternoon snack  Breakfast You may eat 1 of the following  Smoothie with Almond milk, handful of kale or spinach, and 1-2 fruit servings of your choice such as berries or 1/2 banana  Whole grain slice of toast and thin layer of low sugar jam or small amount of honey  Whole grain slice of toast and avocado spread  1/2 cup of steel cut oats (oatmeal)   Mid-morning snack 1 fruit serving such as one of the following:  medium-sized apple  medium-sized orange,  Tangerine  1/2 banana   3/4 cup of fresh berries or frozen berries  A protein source such as one of the following:  8 almonds   small handful of walnuts or other nuts  Hummus and vegetable such as  carrots   Lunch A protein source such as 1 of the following: . 1 serving of beans such as black beans, pinto beans, green beans, or edamame (soy beans) . Veggie burger  . Non breaded fish such as salmon or tuna, either baked, grilled, or broiled Vegetable - Half of your plate should be a non-starchy vegetables!  So avoid white potatoes and corn.  Otherwise, eat a large portion of vegetables. . Avocado, cucumber, tomato, carrots, greens, lettuce, squash, okra, etc.  . Vegetables can include salad with olive oil/vinaigrette dressing Grains such as 1/2 cup of brown rice, quinoa, barley or other whole grain or 1 or 2 slices of whole grain bread   Mid-afternoon snack 1 fruit serving such as one of the following:  medium-sized apple  medium-sized orange,  Tangerine  1/2 banana   3/4 cup of fresh berries or frozen berries  A protein source such as one of the following:  8 almonds   small handful of walnuts or other nuts  Hummus and vegetable such as carrots   Dinner A protein source such as 1 of the following: . 1 serving of beans such as black beans, pinto beans, green beans, or edamame (soy beans) . Veggie burger  . Non breaded fish such as salmon or tuna, either baked, grilled, or broiled Vegetable - Half of your plate should be a non-starchy vegetables!  So avoid white potatoes  and corn.  Otherwise, eat a large portion of vegetables. . Avocado, cucumber, tomato, carrots, greens, lettuce, squash, okra, etc.  . Vegetables can include salad with olive oil/vinaigrette dressing Grains such as 1/2 cup of brown rice, quinoa, barley or other whole grain or 1 or 2 slices of whole grain bread   Beverages: Water Unsweet tea Home made juice with a juicer without sugar added other than small bit of honey or agave nectar Water with sugar free flavor such as [place]   AVOID.... For the time being I want you to cut out the following items completely: . Soda, sweet tea, juice, beer or  wine or alcohol . Sweets such as cake, candy, pies, chips, cookies, chocolate

## 2018-11-01 ENCOUNTER — Other Ambulatory Visit: Payer: Self-pay | Admitting: Medical

## 2018-11-01 LAB — URIC ACID: Uric Acid: 9.2 mg/dL — ABNORMAL HIGH (ref 2.5–7.1)

## 2018-11-01 MED ORDER — ATENOLOL 50 MG PO TABS: 50.0000 mg | ORAL_TABLET | Freq: Every day | ORAL | 1 refills | Status: DC

## 2018-11-29 ENCOUNTER — Encounter: Payer: Self-pay | Admitting: Medical

## 2018-11-29 ENCOUNTER — Ambulatory Visit (INDEPENDENT_AMBULATORY_CARE_PROVIDER_SITE_OTHER): Payer: PRIVATE HEALTH INSURANCE | Admitting: Medical

## 2018-11-29 ENCOUNTER — Other Ambulatory Visit: Payer: Self-pay

## 2018-11-29 VITALS — BP 130/82 | HR 65 | Temp 98.1°F | Resp 16 | Ht 68.0 in | Wt 220.2 lb

## 2018-11-29 DIAGNOSIS — I10 Essential (primary) hypertension: Secondary | ICD-10-CM

## 2018-11-29 DIAGNOSIS — E785 Hyperlipidemia, unspecified: Secondary | ICD-10-CM

## 2018-11-29 DIAGNOSIS — N183 Chronic kidney disease, stage 3 unspecified: Secondary | ICD-10-CM

## 2018-11-29 DIAGNOSIS — Z7189 Other specified counseling: Secondary | ICD-10-CM

## 2018-11-29 DIAGNOSIS — E790 Hyperuricemia without signs of inflammatory arthritis and tophaceous disease: Secondary | ICD-10-CM | POA: Diagnosis not present

## 2018-11-29 DIAGNOSIS — E118 Type 2 diabetes mellitus with unspecified complications: Secondary | ICD-10-CM | POA: Diagnosis not present

## 2018-11-29 DIAGNOSIS — Z7185 Encounter for immunization safety counseling: Secondary | ICD-10-CM

## 2018-11-29 NOTE — Progress Notes (Signed)
Subjective:     Patient ID: Kathy [name], female   DOB: 11-28-1955, 63 y.o.   MRN: [id number]  HPI Chief Complaint  Patient presents with  . follow up    follow up DM uric acid fasting    Here for follow-up from October 31, 2018 visit.  At that visit we stopped atenolol chlorthalidone given her elevated uric acid.  We changed to just plain atenolol instead of chlorthalidone combo.  She is here to recheck on kidney marker and uric acid.  Diabetes -she is taking metformin 500 mg twice daily, Tradjenta 5 mg daily, glipizide 5 mg daily.  Typically doesn't eat breakfast.  Eats lunch and dinner, and dinner is usually 6pm.   Checking sugars fasting in morning every other day  Been running 140s on average since getting back on her medications last visit and by cutting out sweets for the most part.   Exercise - walks for 20 minutes or uses walking tape video.      HTN - compliant with Atenolol 50 mg daily, Benicar 20mg  daily,   hyperlipidemia - compliant with Crestor 20mg  daily  No other aggravating or relieving factors. No other complaint.  Past Medical History:  Diagnosis Date  . Atrial fibrillation ([place]) 05/2016  . Diabetes mellitus without complication ([place])    type II  . History of Helicobacter pylori infection   . Hyperlipidemia   . Hypertension   . Microalbuminuria 2016  . Obesity     Review of Systems As in subjective     Objective:   Physical Exam BP 130/82   Pulse 65   Temp 98.1 F (36.7 C) (Oral)   Resp 16   Ht 5\' 8"  (1.727 m)   Wt 220 lb 3.2 oz (99.9 kg)   SpO2 98%   BMI 33.48 kg/m   Wt Readings from Last 3 Encounters:  11/29/18 220 lb 3.2 oz (99.9 kg)  10/31/18 217 lb 9.6 oz (98.7 kg)  06/20/18 212 lb 12.8 oz (96.5 kg)   BP Readings from Last 3 Encounters:  11/29/18 130/82  10/31/18 130/80  06/20/18 130/80    General appearance: alert, no distress, WD/WN, AA female otherwise not examined     Assessment:     Encounter Diagnoses  Name  Primary?  . Elevated uric acid in blood   . Essential hypertension, benign Yes  . Type 2 diabetes mellitus with complication, without long-term current use of insulin ([place])   . Chronic kidney disease (CKD), stage III (moderate) (HCC)   . Hyperlipidemia, unspecified hyperlipidemia type   . Vaccine counseling        Plan:     Labs today as below   I reviewed her nephrology note from [facility] from visit April 20, 2018, and she has CKD 3 secondary to diabetes with 20-year history, albuminuria, hypertension, and was advised low-sodium diet, keep diabetes under control with A1c less than 7%, and they advised she could tolerate metformin 500 twice a day at this time.  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  Vaccines Please call your insurance to see if they cover the Shingrix shingles vaccine and the Prevnar 13  vaccine is you need both of these  Continue exercise regularly  Kathy [name] was seen today for follow up.  Diagnoses and all orders for this visit:  Essential hypertension, benign  Elevated uric acid in blood -     Uric acid  Type 2 diabetes mellitus with complication, without long-term current use of insulin (HCC)  Chronic kidney disease (CKD), stage III (moderate) (HCC)  Hyperlipidemia, unspecified hyperlipidemia type  Vaccine counseling

## 2018-11-29 NOTE — Patient Instructions (Signed)
Call your insurance to check if they cover Shingles Shingrix vaccine and Prevnar 13 pneumonia vaccine.  If they cover these, call in to get these 2 vaccines

## 2018-11-30 ENCOUNTER — Other Ambulatory Visit: Payer: Self-pay | Admitting: Medical

## 2018-11-30 LAB — URIC ACID: URIC ACID: 8.2 mg/dL — AB (ref 2.5–7.1)

## 2018-11-30 MED ORDER — LINAGLIPTIN 5 MG PO TABS: 5.0000 mg | ORAL_TABLET | Freq: Every day | ORAL | 3 refills | Status: DC

## 2018-11-30 MED ORDER — ALLOPURINOL 100 MG PO TABS: 100.0000 mg | ORAL_TABLET | Freq: Every day | ORAL | 2 refills | Status: DC

## 2018-11-30 MED ORDER — ATENOLOL 50 MG PO TABS: 50.0000 mg | ORAL_TABLET | Freq: Every day | ORAL | 3 refills | Status: DC

## 2019-02-24 ENCOUNTER — Other Ambulatory Visit: Payer: Self-pay | Admitting: Medical

## 2019-02-26 ENCOUNTER — Other Ambulatory Visit: Payer: Self-pay

## 2019-02-26 ENCOUNTER — Ambulatory Visit (INDEPENDENT_AMBULATORY_CARE_PROVIDER_SITE_OTHER): Payer: PRIVATE HEALTH INSURANCE | Admitting: Medical

## 2019-02-26 ENCOUNTER — Encounter: Payer: Self-pay | Admitting: Medical

## 2019-02-26 VITALS — BP 148/88 | HR 89 | Temp 98.4°F | Ht 68.0 in | Wt 216.4 lb

## 2019-02-26 DIAGNOSIS — Z7189 Other specified counseling: Secondary | ICD-10-CM

## 2019-02-26 DIAGNOSIS — E118 Type 2 diabetes mellitus with unspecified complications: Secondary | ICD-10-CM | POA: Diagnosis not present

## 2019-02-26 DIAGNOSIS — R809 Proteinuria, unspecified: Secondary | ICD-10-CM

## 2019-02-26 DIAGNOSIS — Z7185 Encounter for immunization safety counseling: Secondary | ICD-10-CM

## 2019-02-26 DIAGNOSIS — N183 Chronic kidney disease, stage 3 unspecified: Secondary | ICD-10-CM

## 2019-02-26 DIAGNOSIS — M779 Enthesopathy, unspecified: Secondary | ICD-10-CM

## 2019-02-26 DIAGNOSIS — I10 Essential (primary) hypertension: Secondary | ICD-10-CM

## 2019-02-26 DIAGNOSIS — E790 Hyperuricemia without signs of inflammatory arthritis and tophaceous disease: Secondary | ICD-10-CM

## 2019-02-26 DIAGNOSIS — K219 Gastro-esophageal reflux disease without esophagitis: Secondary | ICD-10-CM

## 2019-02-26 DIAGNOSIS — E785 Hyperlipidemia, unspecified: Secondary | ICD-10-CM

## 2019-02-26 DIAGNOSIS — L989 Disorder of the skin and subcutaneous tissue, unspecified: Secondary | ICD-10-CM

## 2019-02-26 LAB — LIPID PANEL

## 2019-02-26 NOTE — Progress Notes (Signed)
Subjective:  Kathy [name] is a 63 y.o. female who presents for Chief Complaint  Patient presents with  . Medication Management    fasting med check, no refills needed      Here for med check.  He has some GERD problems mostly in the evening.  She does not eat late at night.  She gets GERD problems mostly in the evening but quits eating around 6 PM.  She does not go to bed till close to midnight.  She does eat spicy foods.  She has had improvement on over-the-counter Pepcid and antacids.  I saw her a year ago for skin lesions on the arms that was treated with antibiotics and prednisone which cleared up however she has brownish skin lesions that remain despite occasional lotion use  She gets occasional bursitis or tendinitis of the arms.  Uses Tylenol over-the-counter for this.  Has pains in both elbows and shoulders.  Worse with rainy days.  No recent trauma injury or fall.  She is retired.  Does some work out in the yard  Diabetes.  She is checking her sugars, morning glucose running 130s to 140s.  However she does occasionally feel clammy or sweaty on average once or twice per week.  She is compliant with glipizide, Tradjenta and metformin.  No foot lesions no reported sugars under 70  Hypertension-compliant with atenolol and olmesartan  Hyperlipidemia-compliant with rosuvastatin  Last visit uric acid was elevated-compliant with allopurinol since last visit  No other aggravating or relieving factors.    No other c/o.  The following portions of the patient's history were reviewed and updated as appropriate: allergies, current medications, past family history, past medical history, past social history, past surgical history and problem list.  ROS Otherwise as in subjective above  Past Medical History:  Diagnosis Date  . Atrial fibrillation ([place]) 05/2016  . Diabetes mellitus without complication ([place])    type II  . History of Helicobacter pylori infection   . Hyperlipidemia    . Hypertension   . Microalbuminuria 2016  . Obesity    Current Outpatient Medications on File Prior to Visit  Medication Sig Dispense Refill  . allopurinol (ZYLOPRIM) 100 MG tablet Take 1 tablet (100 mg total) by mouth daily. 30 tablet 2  . atenolol (TENORMIN) 50 MG tablet Take 1 tablet (50 mg total) by mouth daily. 90 tablet 3  . blood glucose meter kit and supplies KIT Dispense based on patient and insurance preference. Use up to four times daily as directed. (FOR ICD-9 250.00, 250.01). 1 each 0  . glipiZIDE (GLUCOTROL) 5 MG tablet Take 1 tablet (5 mg total) by mouth daily before breakfast. 90 tablet 3  . linagliptin (TRADJENTA) 5 MG TABS tablet Take 1 tablet (5 mg total) by mouth daily. 90 tablet 3  . metFORMIN (GLUCOPHAGE) 500 MG tablet Take 1 tablet (500 mg total) by mouth 2 (two) times daily with a meal. 180 tablet 1  . olmesartan (BENICAR) 20 MG tablet Take 1 tablet (20 mg total) by mouth daily. 90 tablet 1  . rosuvastatin (CRESTOR) 20 MG tablet Take 1 tablet (20 mg total) by mouth at bedtime. 90 tablet 3   No current facility-administered medications on file prior to visit.     Objective: BP (!) 148/88   Pulse 89   Temp 98.4 F (36.9 C) (Oral)   Ht 5' 8" (1.727 m)   Wt 216 lb 6.4 oz (98.2 kg)   SpO2 98%   BMI 32.90  kg/m   Wt Readings from Last 3 Encounters:  02/26/19 216 lb 6.4 oz (98.2 kg)  11/29/18 220 lb 3.2 oz (99.9 kg)  10/31/18 217 lb 9.6 oz (98.7 kg)   BP Readings from Last 3 Encounters:  02/26/19 (!) 148/88  11/29/18 130/82  10/31/18 130/80    General appearance: alert, no distress, well developed, well nourished Neck: supple, no lymphadenopathy, no thyromegaly, no masses Heart: RRR, normal S1, S2, no murmurs Lungs: CTA bilaterally, no wheezes, rhonchi, or rales Scattered brown roundish flat 3 to 5 mm diameter lesions throughout arms bilaterally Bilateral arms nontender, no swelling, no deformity, normal range of motion although she points to pain at  her lateral and medial epicondyles of both arms Pulses: 2+ radial pulses,  normal cap refill Ext: no edema  Diabetic Foot Exam - Simple   Simple Foot Form Diabetic Foot exam was performed with the following findings: Yes 02/26/2019 10:35 AM  Visual Inspection No deformities, no ulcerations, no other skin breakdown bilaterally: Yes Sensation Testing Intact to touch and monofilament testing bilaterally: Yes Pulse Check See comments: Yes Comments 1+ pedal pulses       Assessment: Encounter Diagnoses  Name Primary?  . Essential hypertension, benign Yes  . Type 2 diabetes mellitus with complication, without long-term current use of insulin ([place])   . Chronic kidney disease (CKD), stage III (moderate) (HCC)   . Microalbuminuria   . Elevated blood uric acid level   . Skin lesion   . Hyperlipidemia, unspecified hyperlipidemia type   . Tendonitis   . Vaccine counseling   . Gastroesophageal reflux disease, esophagitis presence not specified      Plan: Hypertension  Continue atenolol 50 mg daily  Continue olmesartan 20 mg daily  Limit salt in the diet  Exercise regularly, at least 150 minutes/week  Chronic Kidney Disease III  Routine labs today  Avoid anti-inflammatories such as ibuprofen, Aleve, Naprosyn, Motrin, Advil as these can harm the kidneys  Avoid dehydration as this can harm the kidneys  Certain medications such as certain blood pressure medicines and diuretics and harm the kidneys if you are dehydrated, so call your medical provider if you are dehydrated or are ill with vomiting or diarrhea on medications such as these  Diabetes type 2  Check your sugars daily fasting in the morning with goal of 130 glucose or less  We also do not want to see less than 70 glucose, we want to avoid hypoglycemia  Check your feet daily for sores or wounds that are not healing  See your eye doctor and dentist yearly  Continue your current medications, and we will call with  lab results and recommendations  We will likely change from glipizide giving the potential hypoglycemic symptoms you are having  Hyperlipidemia  Continue rosuvastatin, lipid labs today  Microalbuminuria  Labs today, continue olmesartan  Skin lesion  We treated you with antibiotic Bactrim and prednisone a year ago  Given the skin findings today I would like you to use Cetaphil or Aquaphor lotion for the next few weeks to see if this improves the current skin findings  If not improving in the next few weeks then we would recommend dermatology referral  You attribute the initial issue a year ago to eating a lot of lemons or acidic foods  Tendinitis of shoulders and elbows  Continue Tylenol as needed  Continue regular exercise  Pending labs I will give you some other potential topical creams to use for supportive therapy  Vaccine  counesling:  Shingles vaccine:  I recommend you have a shingles vaccine to help prevent shingles or herpes zoster outbreak.   Please call your insurer to inquire about coverage for the Shingrix vaccine given in 2 doses.   Some insurers cover this vaccine after age 60, some cover this after age 37.  If your insurer covers this, then call to schedule appointment to have this vaccine here.  Elevated uric acid last visit-restart allopurinol, recheck labs today, compliant with allopurinol  GERD- no prior EGD.  Improved with over-the-counter Pepcid.  We discussed GERD triggers to avoid.  Marylee was seen today for medication management.  Diagnoses and all orders for this visit:  Essential hypertension, benign -     Lipid panel -     Comprehensive metabolic panel -     Microalbumin/Creatinine Ratio, Urine  Type 2 diabetes mellitus with complication, without long-term current use of insulin (HCC) -     Hemoglobin A1c  Chronic kidney disease (CKD), stage III (moderate) (HCC) -     Uric acid -     CBC  Microalbuminuria -     Microalbumin/Creatinine  Ratio, Urine  Elevated blood uric acid level -     Uric acid  Skin lesion  Hyperlipidemia, unspecified hyperlipidemia type  Tendonitis  Vaccine counseling  Gastroesophageal reflux disease, esophagitis presence not specified    Follow up: pending labs

## 2019-02-26 NOTE — Telephone Encounter (Signed)
Pt had an appointment and denied needing refills. Please advise,

## 2019-02-27 LAB — COMPREHENSIVE METABOLIC PANEL WITH GFR
ALT: 12 IU/L (ref 0–32)
AST: 16 IU/L (ref 0–40)
Albumin/Globulin Ratio: 1.2 (ref 1.2–2.2)
Albumin: 4.1 g/dL (ref 3.8–4.8)
Alkaline Phosphatase: 96 IU/L (ref 39–117)
BUN/Creatinine Ratio: 31 — ABNORMAL HIGH (ref 12–28)
BUN: 60 mg/dL — ABNORMAL HIGH (ref 8–27)
Bilirubin Total: <0.2 mg/dL (ref 0.0–1.2)
CO2: 19 mmol/L — ABNORMAL LOW (ref 20–29)
Calcium: 10.1 mg/dL (ref 8.7–10.3)
Chloride: 100 mmol/L (ref 96–106)
Creatinine, Ser: 1.95 mg/dL — ABNORMAL HIGH (ref 0.57–1.00)
GFR calc Af Amer: 31 mL/min/1.73 — ABNORMAL LOW
GFR calc non Af Amer: 27 mL/min/1.73 — ABNORMAL LOW
Globulin, Total: 3.3 g/dL (ref 1.5–4.5)
Glucose: 180 mg/dL — ABNORMAL HIGH (ref 65–99)
Potassium: 5.5 mmol/L — ABNORMAL HIGH (ref 3.5–5.2)
Sodium: 136 mmol/L (ref 134–144)
Total Protein: 7.4 g/dL (ref 6.0–8.5)

## 2019-02-27 LAB — LIPID PANEL
Chol/HDL Ratio: 2.2 ratio (ref 0.0–4.4)
Cholesterol, Total: 179 mg/dL (ref 100–199)
HDL: 80 mg/dL (ref >39)
LDL Calculated: 80 mg/dL (ref 0–99)
Triglycerides: 96 mg/dL (ref 0–149)
VLDL Cholesterol Cal: 19 mg/dL (ref 5–40)

## 2019-02-27 LAB — CBC
Hematocrit: 38.5 % (ref 34.0–46.6)
Hemoglobin: 12.8 g/dL (ref 11.1–15.9)
MCH: 26.4 pg — ABNORMAL LOW (ref 26.6–33.0)
MCHC: 33.2 g/dL (ref 31.5–35.7)
MCV: 79 fL (ref 79–97)
Platelets: 305 10*3/uL (ref 150–450)
RBC: 4.85 x10E6/uL (ref 3.77–5.28)
RDW: 13.3 % (ref 11.7–15.4)
WBC: 7.2 10*3/uL (ref 3.4–10.8)

## 2019-02-27 LAB — URIC ACID: Uric Acid: 7 mg/dL (ref 2.5–7.1)

## 2019-02-27 LAB — MICROALBUMIN / CREATININE URINE RATIO
Creatinine, Urine: 35.7 mg/dL
Microalb/Creat Ratio: 2891 mg/g creat — ABNORMAL HIGH (ref 0–29)
Microalbumin, Urine: 1032.1 ug/mL

## 2019-02-27 LAB — HEMOGLOBIN A1C
Est. average glucose Bld gHb Est-mCnc: 226 mg/dL
Hgb A1c MFr Bld: 9.5 % — ABNORMAL HIGH (ref 4.8–5.6)

## 2019-02-28 ENCOUNTER — Other Ambulatory Visit: Payer: Self-pay | Admitting: Medical

## 2019-02-28 MED ORDER — ALLOPURINOL 100 MG PO TABS: 100.0000 mg | ORAL_TABLET | Freq: Every day | ORAL | 3 refills | Status: DC

## 2019-02-28 MED ORDER — BD PEN NEEDLE NANO U/F 32G X 4 MM MISC: 1.0000 | Freq: Every day | 11 refills | Status: DC

## 2019-02-28 MED ORDER — NOVOLOG FLEXPEN 100 UNIT/ML ~~LOC~~ SOPN: 5.0000 [IU] | PEN_INJECTOR | Freq: Three times a day (TID) | SUBCUTANEOUS | 3 refills | Status: DC

## 2019-05-07 ENCOUNTER — Other Ambulatory Visit: Payer: Self-pay | Admitting: Medical

## 2019-05-07 ENCOUNTER — Telehealth: Payer: Self-pay | Admitting: Medical

## 2019-05-07 NOTE — Telephone Encounter (Signed)
Please read back over the June lab results message.  At that time her sugars were too high, kidney marker also had.  I had advised her to stop 3 specific medications then due to risk of damage to kidney and the fact they weren't working.   I also advised to start the insulin at that time.  Does she not recall that message?  Find out what reasons she is not wanting to use the insulin so I can give feedback.

## 2019-05-07 NOTE — Telephone Encounter (Signed)
Patient states that she has not been on any insulin and has only been taking metformin since her last visit and she started her diet back and she states the metformin does work but she was eating bad.   She does not want to start insulin because she does not like needles.

## 2019-05-07 NOTE — Telephone Encounter (Signed)
Pt called for refills of metformin. I do not see that on her medication list, I see novolog . She states she never started on novolog and has been taking metformin. She now needs a refill of metformin sent to [facility] on [facility]. Pt can be reached at [phone].

## 2019-05-07 NOTE — Telephone Encounter (Signed)
Is this ok to refill?  

## 2019-05-07 NOTE — Telephone Encounter (Signed)
Please advise on medication

## 2019-05-08 ENCOUNTER — Other Ambulatory Visit: Payer: Self-pay

## 2019-05-08 ENCOUNTER — Telehealth: Payer: Self-pay | Admitting: Medical

## 2019-05-08 ENCOUNTER — Other Ambulatory Visit: Payer: Self-pay | Admitting: Medical

## 2019-05-08 DIAGNOSIS — E118 Type 2 diabetes mellitus with unspecified complications: Secondary | ICD-10-CM

## 2019-05-08 DIAGNOSIS — N183 Chronic kidney disease, stage 3 unspecified: Secondary | ICD-10-CM

## 2019-05-08 MED ORDER — GLUCOSE BLOOD VI STRP
ORAL_STRIP | 6 refills | Status: DC
Start: 2019-05-08 — End: 2019-05-09

## 2019-05-08 MED ORDER — RYBELSUS 3 MG PO TABS: 1.0000 | ORAL_TABLET | Freq: Every day | ORAL | 1 refills | Status: DC

## 2019-05-08 NOTE — Telephone Encounter (Signed)
Please refer her to endocrinology ASAP.  Please call her back and remind her that because of her kidney function declined it is not safe for her to take metformin and high doses at this point.  The current medicines she has been using such as plain metformin or per last visit metformin plus the 2 other oral medications was not strong enough to help lower her blood sugars.  Thus, the current regimen is not only not working but is unsafe.  It is time to get the help of endocrinology so please refer.  In the meantime I will send a different oral medication to her pharmacy that is relatively safe, but all diabetics eventually end up taking insulin if their diabetes progresses like her condition is progressing unfortunately.

## 2019-05-08 NOTE — Telephone Encounter (Signed)
Pt needs refill contour next test strips to [facility]

## 2019-05-08 NOTE — Telephone Encounter (Signed)
Referral put in to Epic and [facility]

## 2019-05-08 NOTE — Telephone Encounter (Signed)
done

## 2019-05-08 NOTE — Telephone Encounter (Signed)
Patient notified and states that she will try the Rybelsus.    Samples in front to pick up.

## 2019-05-08 NOTE — Telephone Encounter (Signed)
Test strips sent in to pharmacy.

## 2019-05-08 NOTE — Telephone Encounter (Signed)
See other message from today.  Last visit note for results of labs I requested referral to endocrinology.  Not sure what happened to that referral?  I sent to her pharmacy today and medicine called Rybelsus.  This is a once daily tablet.  We may have samples if she wants to try that first.  Bottom line, with her kidney function is not safe for her to use metformin or many of the oral medications.  So I strongly recommend she start at minimum once daily insulin if she is willing to do that.  Make sure we refer to endocrinology ASAP and she should call [facility] for follow up if she hasn't seen then in the past 6 months.

## 2019-05-09 ENCOUNTER — Other Ambulatory Visit: Payer: Self-pay

## 2019-05-09 ENCOUNTER — Other Ambulatory Visit: Payer: Self-pay | Admitting: Medical

## 2019-05-09 DIAGNOSIS — E118 Type 2 diabetes mellitus with unspecified complications: Secondary | ICD-10-CM

## 2019-05-09 MED ORDER — GLUCOSE BLOOD VI STRP: ORAL_STRIP | 6 refills | Status: DC

## 2019-05-10 ENCOUNTER — Telehealth: Payer: Self-pay | Admitting: Medical

## 2019-05-10 DIAGNOSIS — E118 Type 2 diabetes mellitus with unspecified complications: Secondary | ICD-10-CM

## 2019-05-10 MED ORDER — GLUCOSE BLOOD VI STRP: ORAL_STRIP | 6 refills | Status: AC

## 2019-05-10 NOTE — Telephone Encounter (Signed)
Resent to the pharmacy with brand name

## 2019-05-10 NOTE — Telephone Encounter (Signed)
Pharmacy needs type of test strips, refill was sent in but not type of test strips please send to [facility] [address] [place]

## 2019-05-10 NOTE — Telephone Encounter (Signed)
Spoke to patient and she is using contour next test strips.

## 2019-05-12 ENCOUNTER — Telehealth: Payer: Self-pay | Admitting: Medical

## 2019-05-12 NOTE — Telephone Encounter (Signed)
P.A. Alveda Reasons done

## 2019-05-22 ENCOUNTER — Telehealth: Payer: Self-pay | Admitting: Family Medicine

## 2019-05-22 NOTE — Telephone Encounter (Signed)
See 05/08/19 message.  We have went over this with her already before.   She is being referred to endocrinology/diabetes specialist since she is not under control with diabetes and can't take many of the available medications.   Go back over the recommendations including medication recommendations from 05/08/19 message

## 2019-05-22 NOTE — Telephone Encounter (Signed)
Pt called and states her blood sugar is still running 140.  Do we need to change the dose of her Rybelsus and should she still be taking any of her other blood sugar medicine?  [phone]

## 2019-05-23 NOTE — Telephone Encounter (Signed)
I called pt and went over 8/25 message again.  Advised pt if she doesn't hear from Endo within a week to call us back. Also advised pt to make appt with [facility] if she has not seen them in six months.  [facility] will please follow up on referral?

## 2019-05-23 NOTE — Telephone Encounter (Signed)
It appears that Kathy [name] has tried to contact patient 2 times to schedule an appointment, they lmom for patient to call back and schedule. I spoke to patient today and provided her the phone number for [name] so she can call and schedule her appointment.

## 2019-05-30 NOTE — Telephone Encounter (Signed)
P.A. was denied, called [facility] t# [phone] reopened P.A. was denied due to needing dates of Metformin trial.  They are coding as Urgent and will respond within 24 hours

## 2019-05-31 ENCOUNTER — Ambulatory Visit: Payer: PRIVATE HEALTH INSURANCE | Admitting: Internal Medicine

## 2019-05-31 ENCOUNTER — Encounter: Payer: Self-pay | Admitting: Internal Medicine

## 2019-05-31 ENCOUNTER — Other Ambulatory Visit: Payer: Self-pay

## 2019-05-31 VITALS — BP 152/88 | HR 98 | Temp 98.8°F | Ht 68.0 in | Wt 208.2 lb

## 2019-05-31 DIAGNOSIS — I10 Essential (primary) hypertension: Secondary | ICD-10-CM | POA: Diagnosis not present

## 2019-05-31 DIAGNOSIS — E1165 Type 2 diabetes mellitus with hyperglycemia: Secondary | ICD-10-CM

## 2019-05-31 DIAGNOSIS — E1122 Type 2 diabetes mellitus with diabetic chronic kidney disease: Secondary | ICD-10-CM

## 2019-05-31 DIAGNOSIS — N183 Chronic kidney disease, stage 3 (moderate): Secondary | ICD-10-CM | POA: Diagnosis not present

## 2019-05-31 LAB — POCT GLYCOSYLATED HEMOGLOBIN (HGB A1C): Hemoglobin A1C: 8 % — AB (ref 4.0–5.6)

## 2019-05-31 LAB — GLUCOSE, POCT (MANUAL RESULT ENTRY): POC Glucose: 228 mg/dl — AB (ref 70–99)

## 2019-05-31 NOTE — Patient Instructions (Signed)
-   STOP METFORMIN  - Glipizide 5 mg, 1 tablet before Breakfast and 1 tablet before supper - Continue tradjenta 5mg  daily    - Check sugar before Breakfast and Supper  -Choose healthy, lower carb lower calorie snacks: toss salad, cooked vegetables, cottage cheese, peanut butter, low fat cheese / string cheese, lower sodium deli meat, tuna salad or chicken salad     HOW TO TREAT LOW BLOOD SUGARS (Blood sugar LESS THAN 70 MG/DL)  Please follow the RULE OF 15 for the treatment of hypoglycemia treatment (when your (blood sugars are less than 70 mg/dL)    STEP 1: Take 15 grams of carbohydrates when your blood sugar is low, which includes:   3-4 GLUCOSE TABS  OR  3-4 OZ OF JUICE OR REGULAR SODA OR  ONE TUBE OF GLUCOSE GEL     STEP 2: RECHECK blood sugar in 15 MINUTES STEP 3: If your blood sugar is still low at the 15 minute recheck --> then, go back to STEP 1 and treat AGAIN with another 15 grams of carbohydrates.

## 2019-05-31 NOTE — Progress Notes (Signed)
Name: Kathy [name]  MRN/ DOB: [id number], [birth year]   Age/ Sex: 63 y.o., female    PCP: Carlena Hurl, PA-C   Reason for Endocrinology Evaluation: Type 2 Diabetes Mellitus     Date of Initial Endocrinology Visit: 05/31/2019     PATIENT IDENTIFIER: Kathy [name] is a 63 y.o. female with a past medical history of T2Dm, HTN, A.Fib. The patient presented for initial endocrinology clinic visit on 05/31/2019 for consultative assistance with her diabetes management.    HPI: Ms. Dahan was    Diagnosed with DM 2001  Prior Medications tried/Intolerance: Farxiga, Glipizide (Stopped 02/2019), tradjenta, metfoRMIN . Rybelsus started 04/2019 but did not help so she stopped it.  Currently checking blood sugars 3 x / day Hypoglycemia episodes : no Hemoglobin A1c has ranged from 6.7% in 2015, peaking at 10.4% in 2020. Patient required assistance for hypoglycemia: no Patient has required hospitalization within the last 1 year from hyper or hypoglycemia: no  In terms of diet, the patient eats 2 meals a day, snacks 1-2 x a day, no sweet drinks   Pt stopped Rybelsus on her own due to persistent hyperglycemia and was restarted Metformin ( pt admits this was against PCP advise) , tradjenta and Glipizide)     HOME DIABETES REGIMEN: Metformin 500 mg 1 tablet daily  Glipizide 5 mg 1 tablet daily Tradjenta 5 mg daily    Statin: Yes ACE-I/ARB: Yes Prior Diabetic Education: yes   METER DOWNLOAD SUMMARY: Did not bring    DIABETIC COMPLICATIONS: Microvascular complications:   CKDIII  Denies: retinopathy , neuropathy   Last eye exam: Completed 2018  Macrovascular complications:    Denies: CAD, PVD, CVA   PAST HISTORY: Past Medical History:  Past Medical History:  Diagnosis Date  . Atrial fibrillation ([place]) 05/2016  . Diabetes mellitus without complication ([place])    type II  . History of Helicobacter pylori infection   . Hyperlipidemia   . Hypertension   .  Microalbuminuria 2016  . Obesity    Past Surgical History:  Past Surgical History:  Procedure Laterality Date  . COLONOSCOPY     age 65yo      Social History:  reports that she has never smoked. She has never used smokeless tobacco. She reports current alcohol use. She reports that she does not use drugs. Family History:  Family History  Problem Relation Age of Onset  . Stroke Mother   . Cancer Father        throat  . Diabetes Sister   . Diabetes Sister   . Diabetes Sister   . Cancer Sister        breast  . Breast cancer Sister   . Heart disease Neg Hx      HOME MEDICATIONS: Allergies as of 05/31/2019   No Known Allergies     Medication List       Accurate as of May 31, 2019  2:57 PM. If you have any questions, ask your nurse or doctor.        STOP taking these medications   metFORMIN 500 MG tablet Commonly known as: GLUCOPHAGE Stopped by: Dorita Sciara, MD     TAKE these medications   allopurinol 100 MG tablet Commonly known as: Zyloprim Take 1 tablet (100 mg total) by mouth daily.   atenolol 50 MG tablet Commonly known as: Tenormin Take 1 tablet (50 mg total) by mouth daily.   blood glucose meter kit and supplies Kit Dispense based  on patient and insurance preference. Use up to four times daily as directed. (FOR ICD-9 250.00, 250.01).   glipiZIDE 5 MG tablet Commonly known as: GLUCOTROL Take 5 mg by mouth 2 (two) times daily before a meal.   glucose blood test strip Contour Next test strips. Use as instructed   linagliptin 5 MG Tabs tablet Commonly known as: TRADJENTA Take 5 mg by mouth daily.   olmesartan 20 MG tablet Commonly known as: BENICAR TAKE ONE TABLET BY MOUTH DAILY   rosuvastatin 20 MG tablet Commonly known as: Crestor Take 1 tablet (20 mg total) by mouth at bedtime.   Rybelsus 3 MG Tabs Generic drug: Semaglutide Take 1 tablet by mouth daily.        ALLERGIES: No Known Allergies   REVIEW OF SYSTEMS: A  comprehensive ROS was conducted with the patient and is negative except as per HPI and below:  Review of Systems  Constitutional: Negative for chills and fever.  HENT: Negative for congestion and sore throat.   Eyes: Negative for blurred vision and pain.  Respiratory: Negative for cough and shortness of breath.   Cardiovascular: Negative for chest pain and palpitations.  Gastrointestinal: Negative for diarrhea and nausea.  Genitourinary: Negative for frequency.  Neurological: Negative for tingling and tremors.  Endo/Heme/Allergies: Negative for polydipsia.  Psychiatric/Behavioral: Negative for depression. The patient is not nervous/anxious.       OBJECTIVE:   VITAL SIGNS: BP (!) 152/88 (BP Location: Left Arm, Patient Position: Sitting, Cuff Size: Large)   Pulse 98   Temp 98.8 F (37.1 C)   Ht _0  (1.727 m)   Wt 208 lb 3.2 oz (94.4 kg)   SpO2 99%   BMI 31.66 kg/m    PHYSICAL EXAM:  General: Pt appears well and is in NAD  Hydration: Well-hydrated with moist mucous membranes and good skin turgor  HEENT: Head: Unremarkable, edentulous. Oropharynx clear without exudate.  Eyes: External eye exam normal without stare, lid lag or exophthalmos.  EOM intact.   Neck: General: Supple without adenopathy or carotid bruits. Thyroid: Thyroid size normal.  No goiter or nodules appreciated. No thyroid bruit.  Lungs: Clear with good BS bilat with no rales, rhonchi, or wheezes  Heart: RRR with normal S1 and S2 and no gallops; no murmurs; no rub  Abdomen: Normoactive bowel sounds, soft, nontender, without masses or organomegaly palpable  Extremities:  Lower extremities - No pretibial edema. No lesions.  Skin: Normal texture and temperature to palpation. No rash noted. No Acanthosis nigricans/skin tags. No lipohypertrophy.  Neuro: MS is good with appropriate affect, pt is alert and Ox3    DM foot exam: 05/31/2019  The skin of the feet is intact without sores or ulcerations. The pedal pulses  are 2+ on right and 2+ on left. The sensation is intact to a screening 5.07, 10 gram monofilament bilaterally   DATA REVIEWED:  Lab Results  Component Value Date   HGBA1C 8.0 (A) 05/31/2019   HGBA1C 9.5 (H) 02/26/2019   HGBA1C 10.4 (A) 10/31/2018   Lab Results  Component Value Date   MICROALBUR 98.0 05/24/2016   LDLCALC 80 02/26/2019   CREATININE 1.95 (H) 02/26/2019   Lab Results  Component Value Date   MICRALBCREAT 6,533 (H) 05/24/2016    Lab Results  Component Value Date   CHOL 179 02/26/2019   HDL 80 02/26/2019   LDLCALC 80 02/26/2019   TRIG 96 02/26/2019   CHOLHDL 2.2 02/26/2019        ASSESSMENT /  PLAN / RECOMMENDATIONS:   1) Type 2 Diabetes Mellitus, Sub-Optimally controlled, With CKD III complications - Most recent A1c of 8.0 %. Goal A1c < 7.0 %.    Plan: GENERAL: I have discussed with the patient the pathophysiology of diabetes. We went over the natural progression of the disease. We talked about both insulin resistance and insulin deficiency. We stressed the importance of lifestyle changes including diet and exercise. I explained the complications associated with diabetes including retinopathy, nephropathy, neuropathy as well as increased risk of cardiovascular disease. We went over the benefit seen with glycemic control.    I explained to the patient that diabetic patients are at higher than normal risk for amputations.   We discussed that with her low GFR, we are limited on oral glycemic agents , we discussed that it is unsafe to continue metformin with a GFR < 35   We also discussed the importance of lifestyle changes in diabetes control   MEDICATIONS: - STOP METFORMIN  - Glipizide 5 mg, 1 tablet before Breakfast and 1 tablet before supper - Continue tradjenta 106m daily    EDUCATION / INSTRUCTIONS:  BG monitoring instructions: Patient is instructed to check her blood sugars 2 times a day, fasting and supper time.  [facility]  if: BG persistently < 70 or > 300. . I reviewed the Rule of 15 for the treatment of hypoglycemia in detail with the patient. Literature supplied.   2) Diabetic complications:   Eye: Does not have known diabetic retinopathy.   Neuro/ Feet: Does not have known diabetic peripheral neuropathy.  Renal: Patient does  have known baseline CKD. She is  on an ACEI/ARB at present.  3) Lipids: Patient is  on a statin. Discussed the cardiovascular benefits of statins   4) Hypertension: She is above goal of < 140/90 mmHg. Pt did not take hypertensive meds today, discussed the importance of compliance.       Signed electronically by: AMack Guise MD  [facility]  [facility] [address], [place] [address]Phone: [phone]: [phone]  CC: TCarlena Hurl PA-C [address] [address]Phone: [phone] Fax: [phone]   Return to Endocrinology clinic as below: Future Appointments  Date Time Provider [place] 07/26/2019  9:30 AM Kenitha Glendinning, IMelanie Crazier MD [facility] None

## 2019-06-12 ENCOUNTER — Other Ambulatory Visit: Payer: Self-pay | Admitting: Medical

## 2019-06-12 NOTE — Telephone Encounter (Signed)
Is this appropriate for [place] to refill?

## 2019-07-01 NOTE — Telephone Encounter (Signed)
Still no response to P.A. will call plan

## 2019-07-05 ENCOUNTER — Telehealth: Payer: Self-pay | Admitting: Medical

## 2019-07-05 NOTE — Telephone Encounter (Signed)
Pt confused on her medications

## 2019-07-06 ENCOUNTER — Other Ambulatory Visit: Payer: Self-pay

## 2019-07-06 ENCOUNTER — Encounter: Payer: Self-pay | Admitting: Medical

## 2019-07-06 ENCOUNTER — Ambulatory Visit (INDEPENDENT_AMBULATORY_CARE_PROVIDER_SITE_OTHER): Payer: PRIVATE HEALTH INSURANCE | Admitting: Medical

## 2019-07-06 VITALS — BP 172/100 | HR 91 | Temp 96.8°F | Wt 211.6 lb

## 2019-07-06 DIAGNOSIS — E278 Other specified disorders of adrenal gland: Secondary | ICD-10-CM | POA: Insufficient documentation

## 2019-07-06 DIAGNOSIS — E785 Hyperlipidemia, unspecified: Secondary | ICD-10-CM

## 2019-07-06 DIAGNOSIS — R809 Proteinuria, unspecified: Secondary | ICD-10-CM

## 2019-07-06 DIAGNOSIS — N183 Chronic kidney disease, stage 3 unspecified: Secondary | ICD-10-CM | POA: Diagnosis not present

## 2019-07-06 DIAGNOSIS — E118 Type 2 diabetes mellitus with unspecified complications: Secondary | ICD-10-CM

## 2019-07-06 DIAGNOSIS — Z23 Encounter for immunization: Secondary | ICD-10-CM

## 2019-07-06 DIAGNOSIS — I10 Essential (primary) hypertension: Secondary | ICD-10-CM

## 2019-07-06 DIAGNOSIS — E790 Hyperuricemia without signs of inflammatory arthritis and tophaceous disease: Secondary | ICD-10-CM

## 2019-07-06 DIAGNOSIS — I4891 Unspecified atrial fibrillation: Secondary | ICD-10-CM

## 2019-07-06 DIAGNOSIS — N281 Cyst of kidney, acquired: Secondary | ICD-10-CM

## 2019-07-06 MED ORDER — ALLOPURINOL 100 MG PO TABS: 100.0000 mg | ORAL_TABLET | Freq: Every day | ORAL | 3 refills | Status: DC

## 2019-07-06 MED ORDER — ATENOLOL 50 MG PO TABS: 50.0000 mg | ORAL_TABLET | Freq: Every day | ORAL | 3 refills | Status: DC

## 2019-07-06 MED ORDER — ROSUVASTATIN CALCIUM 20 MG PO TABS: 20.0000 mg | ORAL_TABLET | Freq: Every day | ORAL | 3 refills | Status: DC

## 2019-07-06 NOTE — Progress Notes (Signed)
Subjective: Chief Complaint  Patient presents with  . Medication Management   Here for BP concerns, medications review.  HTN - taking Benicar 29m daily.  Not taking Atenolol 550mthe past month.  Was taking Atenolol but pharmacy refilled combo atenolol and diuretic so she didn't take it as it was incorrect.   Not checking home BPs.  Gout - not taking allopurinol, ran out.  Diabetes - saw Dr. ShKelton Pillarecent to establish care.     CKD - [facility] but hasn't been back.  Only saw them once.     hyperlipemia - compliant with statin.     Exercise - walks 20-30 minutes 3- 4 days per week  Doesn't add salt to food, but does eat some canned vegetables and there is salt in a lot of her foods.  Eats bacon every morning.  Tries not to eat a lot of sweets.  Not going to restaurants. Eats 2-3 times per day.  Past Medical History:  Diagnosis Date  . Atrial fibrillation ([facility]09/2017  . Diabetes mellitus without complication ([facility]   type II  . History of Helicobacter pylori infection   . Hyperlipidemia   . Hypertension   . Microalbuminuria 2016  . Obesity     Current Outpatient Medications on File Prior to Visit  Medication Sig Dispense Refill  . blood glucose meter kit and supplies KIT Dispense based on patient and insurance preference. Use up to four times daily as directed. (FOR ICD-9 250.00, 250.01). 1 each 0  . glipiZIDE (GLUCOTROL) 5 MG tablet Take 5 mg by mouth 2 (two) times daily before a meal.    . glucose blood test strip Contour Next test strips. Use as instructed 100 each 6  . linagliptin (TRADJENTA) 5 MG TABS tablet Take 5 mg by mouth daily.    . Marland Kitchenlmesartan (BENICAR) 20 MG tablet TAKE ONE TABLET BY MOUTH DAILY 90 tablet 0  . Semaglutide (RYBELSUS) 3 MG TABS Take 1 tablet by mouth daily. 30 tablet 1   No current facility-administered medications on file prior to visit.    ROS as in subjective    Objective: BP (!) 172/100   Pulse 91   Temp (!) 96.8  F (36 C)   Wt 211 lb 9.6 oz (96 kg)   SpO2 97%   BMI 32.17 kg/m   BP Readings from Last 3 Encounters:  07/06/19 (!) 172/100  05/31/19 (!) 152/88  02/26/19 (!) 148/88   Wt Readings from Last 3 Encounters:  07/06/19 211 lb 9.6 oz (96 kg)  05/31/19 208 lb 3.2 oz (94.4 kg)  02/26/19 216 lb 6.4 oz (98.2 kg)   General appearance: alert, no distress, WD/WN,  HEENT: normocephalic, sclerae anicteric, TMs pearly, nares patent, no discharge or erythema, pharynx normal Oral cavity: MMM, no lesions Neck: supple, no lymphadenopathy, no thyromegaly, no masses, no bruits Heart: RRR, normal S1, S2, no murmurs Lungs: CTA bilaterally, no wheezes, rhonchi, or rales Abdomen: +bs, soft, non tender, non distended, no masses, no hepatomegaly, no splenomegaly, no bruits Pulses: 2+ symmetric, upper and 1+ lower extremities, normal cap refill   CXR 03/2017: IMPRESSION: No acute pulmonary process.  Cardiomediastinal contours are normal.  Echocardiogram 05/2016: Left ventricle:  The cavity size was normal. There was mild concentric hypertrophy. Systolic function was normal. The estimated ejection fraction was in the range of 55% to 60%. Wall motion was normal; there were no regional wall motion abnormalities.     Assessment: Encounter Diagnoses  Name  Primary?  . Essential hypertension, benign Yes  . Atrial fibrillation, unspecified type (HCC)   . Type 2 diabetes mellitus with complication, without long-term current use of insulin (HCC)   . Stage 3 chronic kidney disease, unspecified whether stage 3a or 3b CKD   . Hyperlipidemia, unspecified hyperlipidemia type   . Microalbuminuria   . Need for influenza vaccination   . Elevated uric acid in blood   . Renal cyst   . Left adrenal mass (HCC)       Plan: CKD 3-needs to work on improving diabetic control, blood pressure control, healthy diet and exercise.  I referred her to endocrinology last visit.  She is working with endocrinology to improve  her diabetes.  Stressed to her the importance of communication.  She apparently had an issue with blood pressure medicines recently and did not call.  I will call her pharmacy today to resolve the issue.  She has not been taking her atenolol at all.  She is compliant with olmesartan 20 mg daily.  Restart atenolol.  Advise she always call if there is an issue in the future.  We discussed importance of compliance with medication to try and prevent additional damage to the kidneys  Hypertension, history of A. fib-same plan is CKD 3  Hyperlipidemia-reviewed June 2020 lipid panel.  Continue statin  Counseled on the influenza virus vaccine.  Vaccine information sheet given.  Influenza vaccine given after consent obtained.  Microalbuminuria-continue ARB, follow-up with [facility]  Elevated uric acid-noncompliant.  Again discussed importance of compliance.  Restart allopurinol  [facility] October 2019 visit.  Complex right renal cyst they advised annual surveillance.  Left adrenal nodule, likely nonfunctional adenoma.  Surveillance as well.  They advised follow-up with CT renal in 1 year.  She is due at this time for follow-up with both urology and nephrology.  I advise she go ahead make these follow-up appointment  I encouraged her to consider switching pharmacies.  Lab called her stated pharmacy today they were so hasty on the phone with me that I am worried that she hearing medications duplicated or not getting the correct thing given the problems I had on the phone with the pharmacy today.  So, if I am having this much problem, I can only assume she is having problems too  Of note, I reviewed last mammogram from 2019, Cologuard from 2019.  I gave her a prescription for glucometer test for today as she does not have an updated prescription  Marrisa was seen today for medication management.  Diagnoses and all orders for this visit:  Essential hypertension,  benign -     Renal Function Panel  Atrial fibrillation, unspecified type (HCC)  Type 2 diabetes mellitus with complication, without long-term current use of insulin (HCC)  Stage 3 chronic kidney disease, unspecified whether stage 3a or 3b CKD -     Renal Function Panel  Hyperlipidemia, unspecified hyperlipidemia type  Microalbuminuria  Need for influenza vaccination  Elevated uric acid in blood  Renal cyst  Left adrenal mass (HCC)  Other orders -     rosuvastatin (CRESTOR) 20 MG tablet; Take 1 tablet (20 mg total) by mouth at bedtime. -     atenolol (TENORMIN) 50 MG tablet; Take 1 tablet (50 mg total) by mouth daily. -     allopurinol (ZYLOPRIM) 100 MG tablet; Take 1 tablet (100 mg total) by mouth daily.    

## 2019-07-06 NOTE — Patient Instructions (Addendum)
Recommendations: See your eye doctor yearly and make sure they get [place] a copy of your note  See your dentist yearly  Go ahead and make a follow-up appointment with [facility] regarding chronic kidney disease.  Their phone number is [phone]  Head negative follow-up with [facility] as well, their phone number is [phone]  Continue follow-up with Dr. Kelton Pillar for diabetes to improve your diabetes numbers  High blood pressure  You should be taking atenolol 50 mg daily, not atenolol chlorthalidone daily  You should be taking olmesartan 20 mg daily  I called the pharmacy to verify they have this on file and they have canceled the atenolol chlorthalidone  You may want to consider getting a blood pressure cuff to monitor your blood pressures at home  Normal blood pressure would be 120/70-130/80  High cholesterol  Continue rosuvastatin/Crestor 20 mg daily.  The pharmacy for whatever reason had Lipitor still on file.  I canceled this  Elevated uric acid  Continue or restart allopurinol.  I did verify with pharmacy this is on file.  They said you had not been picking this up   In the future if you have an issue with the pharmacy or medications please call.  Do not assume that we know there is an issue   We updated your flu shot today   Shingles vaccine:  I recommend you have a shingles vaccine to help prevent shingles or herpes zoster outbreak.   Please call your insurer to inquire about coverage for the Shingrix vaccine given in 2 doses.   Some insurers cover this vaccine after age 55, some cover this after age 9.  If your insurer covers this, then call to schedule appointment to have this vaccine here.

## 2019-07-07 LAB — RENAL FUNCTION PANEL
Albumin: 4.1 g/dL (ref 3.8–4.8)
BUN/Creatinine Ratio: 18 (ref 12–28)
BUN: 33 mg/dL — ABNORMAL HIGH (ref 8–27)
CO2: 25 mmol/L (ref 20–29)
Calcium: 10.3 mg/dL (ref 8.7–10.3)
Chloride: 102 mmol/L (ref 96–106)
Creatinine, Ser: 1.87 mg/dL — ABNORMAL HIGH (ref 0.57–1.00)
GFR calc Af Amer: 33 mL/min/{1.73_m2} — ABNORMAL LOW (ref >59)
GFR calc non Af Amer: 28 mL/min/{1.73_m2} — ABNORMAL LOW (ref >59)
Glucose: 193 mg/dL — ABNORMAL HIGH (ref 65–99)
Phosphorus: 4.1 mg/dL (ref 3.0–4.3)
Potassium: 4.9 mmol/L (ref 3.5–5.2)
Sodium: 142 mmol/L (ref 134–144)

## 2019-07-10 NOTE — Telephone Encounter (Signed)
Had pt come in for appt

## 2019-07-23 NOTE — Telephone Encounter (Signed)
I'm sorry that maybe I didn't realize you were still working on her PA with meds.    I had to refer to eendocrinologygiven several issues with medications and her ttolerability.   Its in their hands now.

## 2019-07-23 NOTE — Telephone Encounter (Signed)
Finally got someone on phone and states P.A. was denied, can do another with A1c and dates of Metformin and mark Urgent.   Called pt to see if she needed more samples in the meantime but states that her diabetic doctor took her off this medication and put her on Tradgenta 1 qd & Glipizide 2 a day.

## 2019-07-23 NOTE — Telephone Encounter (Signed)
Tried multiple times to get person on phone at plan [phone] was constantly put into voicemail where voicemail box was full

## 2019-07-25 NOTE — Progress Notes (Signed)
Name: Kathy [name]  Age/ Sex: 63 y.o., female   MRN/ DOB: [id number], 02-22-1956     PCP: Caryl Ada   Reason for Endocrinology Evaluation: Type 2 Diabetes Mellitus  Initial Endocrine Consultative Visit: 05/31/2019    PATIENT IDENTIFIER: [name] is a 63 y.o. female with a past medical history of T2Dm, HTN, A.Fib. The patient has followed with Endocrinology clinic since 05/31/2019 for consultative assistance with management of her diabetes.  DIABETIC HISTORY:  Kathy [name] was diagnosed with T2DM in 2001. Wilder Glade, Glipizide (Stopped 02/2019), tradjenta, metfoRMIN . Rybelsus started 04/2019 but did not help so she stopped it and restarted metformin, in 05/2019 metformin was stopped again and was advised not to take it due to low GFR. Her hemoglobin A1c has ranged from 6.7% in 2015, peaking at 10.4% in 2020.   SUBJECTIVE:   During the last visit (05/31/2019): Stopped metformin due to low GFR  Today (07/26/2019): [name] is here for a 2 month follow up on diabetes management.  She checks her blood sugars 2 times daily, preprandial to breakfast and bedtime. The patient has not had hypoglycemic episodes since the last clinic visit. Otherwise, the patient has not required any recent emergency interventions for hypoglycemia and has not had recent hospitalizations secondary to hyper or hypoglycemic episodes.    ROS: As per HPI and as detailed below: Review of Systems  HENT: Negative for congestion and sore throat.   Respiratory: Negative for cough and shortness of breath.   Cardiovascular: Negative for chest pain and palpitations.  Gastrointestinal: Negative for diarrhea and nausea.      HOME DIABETES REGIMEN:  Glipizide 5 mg, 1 tablet before Breakfast and 1 tablet before supper Tradjenta 47m daily    METER DOWNLOAD SUMMARY: Date range evaluated: 10/11-11/10/20 Fingerstick Blood Glucose Tests = 35 Average Number Tests/Day = 1.1 Overall Mean FS  Glucose = 160 Standard Deviation = 41  BG Ranges: Low = 86 High = 264   Hypoglycemic Events/30 Days: BG < 50 = 0 Episodes of symptomatic severe hypoglycemia = 0    HISTORY:  Past Medical History:  Past Medical History:  Diagnosis Date  . Atrial fibrillation ([facility] 05/2016  . Diabetes mellitus without complication ([place]    type II  . History of Helicobacter pylori infection   . Hyperlipidemia   . Hypertension   . Microalbuminuria 2016  . Obesity    Past Surgical History:  Past Surgical History:  Procedure Laterality Date  . COLONOSCOPY     age 63yo   Social History:  reports that she has never smoked. She has never used smokeless tobacco. She reports current alcohol use. She reports that she does not use drugs. Family History:  Family History  Problem Relation Age of Onset  . Stroke Mother   . Cancer Father        throat  . Diabetes Sister   . Diabetes Sister   . Diabetes Sister   . Cancer Sister        breast  . Breast cancer Sister   . Heart disease Neg Hx      HOME MEDICATIONS: Allergies as of 07/26/2019   No Known Allergies     Medication List       Accurate as of July 26, 2019  9:49 AM. If you have any questions, ask your nurse or doctor.        STOP taking these medications   Rybelsus 3 MG Tabs Generic  drug: Semaglutide Stopped by: Dorita Sciara, MD     TAKE these medications   allopurinol 100 MG tablet Commonly known as: Zyloprim Take 1 tablet (100 mg total) by mouth daily.   atenolol 50 MG tablet Commonly known as: Tenormin Take 1 tablet (50 mg total) by mouth daily.   blood glucose meter kit and supplies Kit Dispense based on patient and insurance preference. Use up to four times daily as directed. (FOR ICD-9 250.00, 250.01).   glipiZIDE 5 MG tablet Commonly known as: GLUCOTROL Take 5 mg by mouth 2 (two) times daily before a meal.   glucose blood test strip Contour Next test strips. Use as instructed    linagliptin 5 MG Tabs tablet Commonly known as: TRADJENTA Take 5 mg by mouth daily.   olmesartan 20 MG tablet Commonly known as: BENICAR TAKE ONE TABLET BY MOUTH DAILY   rosuvastatin 20 MG tablet Commonly known as: Crestor Take 1 tablet (20 mg total) by mouth at bedtime.        OBJECTIVE:   Vital Signs: BP (!) 160/100 (BP Location: Left Arm, Patient Position: Sitting, Cuff Size: Normal)   Pulse 67   Ht 5' 8"  (1.727 m)   Wt 211 lb (95.7 kg)   SpO2 99%   BMI 32.08 kg/m   Wt Readings from Last 3 Encounters:  07/26/19 211 lb (95.7 kg)  07/06/19 211 lb 9.6 oz (96 kg)  05/31/19 208 lb 3.2 oz (94.4 kg)     Exam: General: Pt appears well and is in NAD  Lungs: Clear with good BS bilat with no rales, rhonchi, or wheezes  Heart: RRR with normal S1 and S2 and no gallops; no murmurs; no rub  Abdomen: Normoactive bowel sounds, soft, nontender, without masses or organomegaly palpable  Extremities: No pretibial edema.  Skin: Normal texture and temperature to palpation. No rash noted. No Acanthosis nigricans/skin tags. No lipohypertrophy.  Neuro: MS is good with appropriate affect, pt is alert and Ox3      DM foot exam: 05/31/2019  The skin of the feet is intact without sores or ulcerations. The pedal pulses are 2+ on right and 2+ on left. The sensation is intact to a screening 5.07, 10 gram monofilament bilaterally    DATA REVIEWED:  Lab Results  Component Value Date   HGBA1C 8.0 (A) 05/31/2019   HGBA1C 9.5 (H) 02/26/2019   HGBA1C 10.4 (A) 10/31/2018   Lab Results  Component Value Date   MICROALBUR 98.0 05/24/2016   LDLCALC 80 02/26/2019   CREATININE 1.87 (H) 07/06/2019   Lab Results  Component Value Date   MICRALBCREAT 6,533 (H) 05/24/2016     Lab Results  Component Value Date   CHOL 179 02/26/2019   HDL 80 02/26/2019   LDLCALC 80 02/26/2019   TRIG 96 02/26/2019   CHOLHDL 2.2 02/26/2019         ASSESSMENT / PLAN / RECOMMENDATIONS:   1) Type 2  Diabetes Mellitus, Sub-Optimally controlled, With CKDIII complications - Most recent A1c of 8.0 %. Goal A1c < 7.0 %.    - In review of her meter download today, her BG's have been improving with better averages.  - I have encouraged her to try and improve on carb intake, there's evidence of some occasional dietary indiscretions - No changes will be made today    MEDICATIONS:  Glipizide 5 mg BID   Tradjenta 5 mg daily   EDUCATION / INSTRUCTIONS:  BG monitoring instructions: Patient is instructed to check  her blood sugars 2 times a day, before breakfast and supper .  Call [facility] if: BG persistently < 70 or > 300. . I reviewed the Rule of 15 for the treatment of hypoglycemia in detail with the patient. Literature supplied.    2) Hypertension: Above goal of < 140/90 mmHg. Will defer to PCP. Pt advised to avoid salt intake    3) Left Adrenal Adenoma :   - This was noted after the visit, will address this on the next visit. Pt will need further workup to determine if her adenoma is functional.     F/U in 3 months    Signed electronically by: Mack Guise, MD  [facility]  [facility] [address] [place], [place] [address] Phone: [phone] FAX: [phone]   CC: Carlena Hurl, PA-C [place] [address] Phone: [phone]  Fax: [phone]  Return to Endocrinology clinic as below: No future appointments.

## 2019-07-26 ENCOUNTER — Ambulatory Visit (INDEPENDENT_AMBULATORY_CARE_PROVIDER_SITE_OTHER): Payer: PRIVATE HEALTH INSURANCE | Admitting: Internal Medicine

## 2019-07-26 ENCOUNTER — Other Ambulatory Visit: Payer: Self-pay

## 2019-07-26 ENCOUNTER — Encounter: Payer: Self-pay | Admitting: Internal Medicine

## 2019-07-26 VITALS — BP 160/90 | HR 67 | Ht 68.0 in | Wt 211.0 lb

## 2019-07-26 DIAGNOSIS — N1832 Chronic kidney disease, stage 3b: Secondary | ICD-10-CM

## 2019-07-26 DIAGNOSIS — E1122 Type 2 diabetes mellitus with diabetic chronic kidney disease: Secondary | ICD-10-CM | POA: Insufficient documentation

## 2019-07-26 DIAGNOSIS — E1121 Type 2 diabetes mellitus with diabetic nephropathy: Secondary | ICD-10-CM | POA: Diagnosis not present

## 2019-07-26 NOTE — Patient Instructions (Signed)
-   Continue Glipizide 5 mg, 1 tablet before Breakfast and 1 tablet before supper - Continue tradjenta 5mg  daily    - Check sugar before Breakfast and Supper  -Choose healthy, lower carb lower calorie snacks: toss salad, cooked vegetables, cottage cheese, peanut butter, low fat cheese / string cheese, lower sodium deli meat, tuna salad or chicken salad     HOW TO TREAT LOW BLOOD SUGARS (Blood sugar LESS THAN 70 MG/DL)  Please follow the RULE OF 15 for the treatment of hypoglycemia treatment (when your (blood sugars are less than 70 mg/dL)    STEP 1: Take 15 grams of carbohydrates when your blood sugar is low, which includes:   3-4 GLUCOSE TABS  OR  3-4 OZ OF JUICE OR REGULAR SODA OR  ONE TUBE OF GLUCOSE GEL     STEP 2: RECHECK blood sugar in 15 MINUTES STEP 3: If your blood sugar is still low at the 15 minute recheck --> then, go back to STEP 1 and treat AGAIN with another 15 grams of carbohydrates.

## 2019-07-28 NOTE — Telephone Encounter (Signed)
ok 

## 2019-08-27 ENCOUNTER — Telehealth: Payer: Self-pay | Admitting: Medical

## 2019-08-27 NOTE — Telephone Encounter (Signed)
Pt called and said she will be out of her Glipizide soon and will need a refill sent to the [facility] on [address].

## 2019-08-29 ENCOUNTER — Other Ambulatory Visit: Payer: Self-pay | Admitting: Medical

## 2019-08-29 MED ORDER — OLMESARTAN MEDOXOMIL 20 MG PO TABS: 20.0000 mg | ORAL_TABLET | Freq: Every day | ORAL | 1 refills | Status: DC

## 2019-08-29 MED ORDER — GLIPIZIDE 5 MG PO TABS: 5.0000 mg | ORAL_TABLET | Freq: Two times a day (BID) | ORAL | 0 refills | Status: DC

## 2019-08-29 NOTE — Telephone Encounter (Signed)
I sent refill.  However, future refills for diabetes medications should go through diabetes doctor since she has been referred to diabetes specialist.

## 2019-10-03 ENCOUNTER — Other Ambulatory Visit: Payer: Self-pay

## 2019-10-03 ENCOUNTER — Encounter: Payer: Self-pay | Admitting: Medical

## 2019-10-03 ENCOUNTER — Telehealth: Payer: Self-pay | Admitting: Medical

## 2019-10-03 ENCOUNTER — Ambulatory Visit: Payer: 59 | Admitting: Medical

## 2019-10-03 VITALS — BP 140/80 | HR 60 | Temp 98.2°F | Ht 68.0 in | Wt 219.2 lb

## 2019-10-03 DIAGNOSIS — I10 Essential (primary) hypertension: Secondary | ICD-10-CM | POA: Diagnosis not present

## 2019-10-03 DIAGNOSIS — E118 Type 2 diabetes mellitus with unspecified complications: Secondary | ICD-10-CM | POA: Diagnosis not present

## 2019-10-03 DIAGNOSIS — N1832 Chronic kidney disease, stage 3b: Secondary | ICD-10-CM

## 2019-10-03 DIAGNOSIS — E1122 Type 2 diabetes mellitus with diabetic chronic kidney disease: Secondary | ICD-10-CM

## 2019-10-03 DIAGNOSIS — N183 Chronic kidney disease, stage 3 unspecified: Secondary | ICD-10-CM

## 2019-10-03 DIAGNOSIS — E1121 Type 2 diabetes mellitus with diabetic nephropathy: Secondary | ICD-10-CM

## 2019-10-03 DIAGNOSIS — R21 Rash and other nonspecific skin eruption: Secondary | ICD-10-CM

## 2019-10-03 DIAGNOSIS — E785 Hyperlipidemia, unspecified: Secondary | ICD-10-CM

## 2019-10-03 MED ORDER — AMLODIPINE-OLMESARTAN 5-20 MG PO TABS: 1.0000 | ORAL_TABLET | Freq: Every day | ORAL | 2 refills | Status: DC

## 2019-10-03 MED ORDER — LINAGLIPTIN 5 MG PO TABS: 5.0000 mg | ORAL_TABLET | Freq: Every day | ORAL | 0 refills | Status: DC

## 2019-10-03 MED ORDER — GLIPIZIDE 5 MG PO TABS: 5.0000 mg | ORAL_TABLET | Freq: Two times a day (BID) | ORAL | 0 refills | Status: DC

## 2019-10-03 NOTE — Telephone Encounter (Signed)
Please let her know  I refilled the 2 diabetes meds.  Future diabetes medications need to be refilled by DIABETES DOCTOR - Dr. Kelton Pillar, not me!!!  I stopped her Olemsartan blood pressure medication.  Instead, I added Amlodipien Olmesartan combo blood pressure pill for better blood pressure control.   Continue Atenolol blood pressure medicaiton  I want to refer her to a KIDNEY doctor.   Please once again explain to her that this is different than a bladder doctor/urologist.  I am pretty sure she hasn't seen [facility], but I want her to have a baseline consult with them.    I strongly recommend a baseline consult with dermatology as she keeps getting this rash on her arms.  I don't know what is causing the rash.   I certainly recommend a daily moisturizing lotion such as lubriderm or shea or cocoa butter.    If agreeable, refer to dermatology too.  F/u with me in 45mo on blood pressure.

## 2019-10-03 NOTE — Progress Notes (Signed)
Subjective: Chief Complaint  Patient presents with  . Medication Management  . Rash    bilateral arms and neck    Here for med check.  Of note I had referred her already to diabetes specialist which she has seen.  Her first questions today was about refills on diabetes medicines and what to do about her sugars not being controlled.  She reports that she ran out of her glipizide and Tradjenta today.  Needs refills today.  HTN - taking Benicar 48m daily.  She reports taking atenolol 50 mg daily.  Home blood pressures are running elevated as well as what we are getting today.  Gout -she notes taking allopurinol  hyperlipemia - compliant with statin.    Exercise - walks 20-30 minutes 3- 4 days per week  In recent weeks she had a rash flareup on her arms like she had a year ago.  She is not sure what is triggering the rash.  She has been using some lotion and is starting to clear up.  Started as small bumps throughout her arms.  No rash on her palms, soles, legs, torso or face.  Past Medical History:  Diagnosis Date  . Atrial fibrillation ([facility] 05/2016  . Diabetes mellitus without complication ([facility]    type II  . History of Helicobacter pylori infection   . Hyperlipidemia   . Hypertension   . Microalbuminuria 2016  . Obesity     Current Outpatient Medications on File Prior to Visit  Medication Sig Dispense Refill  . allopurinol (ZYLOPRIM) 100 MG tablet Take 1 tablet (100 mg total) by mouth daily. 90 tablet 3  . atenolol (TENORMIN) 50 MG tablet Take 1 tablet (50 mg total) by mouth daily. 90 tablet 3  . blood glucose meter kit and supplies KIT Dispense based on patient and insurance preference. Use up to four times daily as directed. (FOR ICD-9 250.00, 250.01). 1 each 0  . glucose blood test strip Contour Next test strips. Use as instructed 100 each 6  . rosuvastatin (CRESTOR) 20 MG tablet Take 1 tablet (20 mg total) by mouth at bedtime. 90 tablet 3   No current  facility-administered medications on file prior to visit.   ROS as in subjective    Objective: BP 140/80   Pulse 60   Temp 98.2 F (36.8 C)   Ht _0  (1.727 m)   Wt 219 lb 3.2 oz (99.4 kg)   SpO2 98%   BMI 33.33 kg/m   BP Readings from Last 3 Encounters:  10/03/19 140/80  07/26/19 (!) 160/90  07/06/19 (!) 172/100   Wt Readings from Last 3 Encounters:  10/03/19 219 lb 3.2 oz (99.4 kg)  07/26/19 211 lb (95.7 kg)  07/06/19 211 lb 9.6 oz (96 kg)   General appearance: alert, no distress, WD/WN, African-American female Skin: Diffuse scattered round brown lesions slightly raised to 3 mm diameter throughout both arms but not including the palms of her hands.  No other rash Neck: supple, no lymphadenopathy, no thyromegaly, no masses, no bruits Heart: RRR, normal S1, S2, no murmurs Lungs: CTA bilaterally, no wheezes, rhonchi, or rales Pulses: 2+ symmetric, upper and 1+ lower extremities, normal cap refill Extremities no edema  CXR 03/2017: IMPRESSION: No acute pulmonary process.  Cardiomediastinal contours are normal.  Echocardiogram 05/2016: Left ventricle:  The cavity size was normal. There was mild concentric hypertrophy. Systolic function was normal. The estimated ejection fraction was in the range of 55% to 60%. Wall motion was normal;  there were no regional wall motion abnormalities.     Assessment: Encounter Diagnoses  Name Primary?  . Type 2 diabetes mellitus with complication, without long-term current use of insulin ([place]) Yes  . Type 2 diabetes mellitus with stage 3b chronic kidney disease, without long-term current use of insulin ([place])   . Essential hypertension, benign   . Stage 3 chronic kidney disease, unspecified whether stage 3a or 3b CKD   . Rash   . Hyperlipidemia, unspecified hyperlipidemia type       Plan: Hypertension-continue atenolol 50 mg daily.  Stop olmesartan and change to amlodipine- olmesartan.  Plan follow-up in 1 month  Diabetes-I  refilled her medications today but advise she go ahead and recheck at endocrinology now.  I sent a message to her endocrinologist as well to get her back in for follow-up.  CKD 3-she seems to have a hard time understanding that there is a difference between urology and nephrology.  We have discussed this prior and I discussed it once again today.  I advised we go ahead and refer  to nephrology to establish care or to follow-up in the event she has actually seen them before to discuss her current kidney function, evaluation and management.  Advise she avoid NSAIDs.  Hydrate well daily.  hyperlipidemia-reviewed June 2020 lipid panel.  Continue statin  Microalbuminuria-continue ARB  Rash-this is a similar rash to what she had within the past year.  It is unclear whether this is an allergy or hypersensitivity to medication.  I do not think it is infectious.  I advised we can refer to dermatology.  She does not seem too motivated to go see dermatology.  I advise she continue using Shea butter or cocoa butter for now but consider dermatology consult  Reginia was seen today for medication management and rash.  Diagnoses and all orders for this visit:  Type 2 diabetes mellitus with complication, without long-term current use of insulin (HCC) -     Comprehensive metabolic panel -     Hemoglobin A1c  Type 2 diabetes mellitus with stage 3b chronic kidney disease, without long-term current use of insulin ([facility]) -     Comprehensive metabolic panel -     Hemoglobin A1c  Essential hypertension, benign -     Comprehensive metabolic panel  Stage 3 chronic kidney disease, unspecified whether stage 3a or 3b CKD -     Comprehensive metabolic panel  Rash -     Comprehensive metabolic panel  Hyperlipidemia, unspecified hyperlipidemia type  Other orders -     glipiZIDE (GLUCOTROL) 5 MG tablet; Take 1 tablet (5 mg total) by mouth 2 (two) times daily before a meal. -     linagliptin (TRADJENTA) 5 MG TABS  tablet; Take 1 tablet (5 mg total) by mouth daily. -     amLODipine-olmesartan (AZOR) 5-20 MG tablet; Take 1 tablet by mouth daily.

## 2019-10-04 ENCOUNTER — Other Ambulatory Visit: Payer: Self-pay | Admitting: Internal Medicine

## 2019-10-04 LAB — COMPREHENSIVE METABOLIC PANEL
ALT: 16 IU/L (ref 0–32)
AST: 13 IU/L (ref 0–40)
Albumin/Globulin Ratio: 1.2 (ref 1.2–2.2)
Albumin: 4.1 g/dL (ref 3.8–4.8)
Alkaline Phosphatase: 89 IU/L (ref 39–117)
BUN/Creatinine Ratio: 24 (ref 12–28)
BUN: 38 mg/dL — ABNORMAL HIGH (ref 8–27)
Bilirubin Total: 0.3 mg/dL (ref 0.0–1.2)
CO2: 24 mmol/L (ref 20–29)
Calcium: 9.5 mg/dL (ref 8.7–10.3)
Chloride: 103 mmol/L (ref 96–106)
Creatinine, Ser: 1.57 mg/dL — ABNORMAL HIGH (ref 0.57–1.00)
GFR calc Af Amer: 40 mL/min/{1.73_m2} — ABNORMAL LOW (ref >59)
GFR calc non Af Amer: 35 mL/min/{1.73_m2} — ABNORMAL LOW (ref >59)
Globulin, Total: 3.3 g/dL (ref 1.5–4.5)
Glucose: 243 mg/dL — ABNORMAL HIGH (ref 65–99)
Potassium: 4.8 mmol/L (ref 3.5–5.2)
Sodium: 138 mmol/L (ref 134–144)
Total Protein: 7.4 g/dL (ref 6.0–8.5)

## 2019-10-04 LAB — HEMOGLOBIN A1C
Est. average glucose Bld gHb Est-mCnc: 203 mg/dL
Hgb A1c MFr Bld: 8.7 % — ABNORMAL HIGH (ref 4.8–5.6)

## 2019-10-04 MED ORDER — GLIPIZIDE 5 MG PO TABS: 5.0000 mg | ORAL_TABLET | Freq: Two times a day (BID) | ORAL | 3 refills | Status: DC

## 2019-10-04 MED ORDER — LINAGLIPTIN 5 MG PO TABS: 5.0000 mg | ORAL_TABLET | Freq: Every day | ORAL | 3 refills | Status: DC

## 2019-10-04 NOTE — Telephone Encounter (Signed)
I am noting as of this message for documentation sake that she is refusing updated consult with nephrology despite recommendations by [place], the PCP given uncontrolled hypertension, uncontrolled diabetes and worsening renal function.   Kathy [name]

## 2019-10-04 NOTE — Telephone Encounter (Signed)
Spoke to patient and she stated she has already been to [facility]. She would not like to see a dermatologist at this time.

## 2019-10-18 ENCOUNTER — Telehealth: Payer: Self-pay

## 2019-10-18 NOTE — Telephone Encounter (Signed)
I submitted a PA for the pts. Tradjenta 5mg  and it was approved from 10/17/19-10/16/20.

## 2019-10-31 ENCOUNTER — Ambulatory Visit (INDEPENDENT_AMBULATORY_CARE_PROVIDER_SITE_OTHER): Payer: 59 | Admitting: Internal Medicine

## 2019-10-31 ENCOUNTER — Other Ambulatory Visit: Payer: Self-pay

## 2019-10-31 ENCOUNTER — Encounter: Payer: Self-pay | Admitting: Internal Medicine

## 2019-10-31 VITALS — BP 132/80 | HR 60 | Temp 98.6°F

## 2019-10-31 DIAGNOSIS — E1165 Type 2 diabetes mellitus with hyperglycemia: Secondary | ICD-10-CM

## 2019-10-31 MED ORDER — LINAGLIPTIN 5 MG PO TABS: 5.0000 mg | ORAL_TABLET | Freq: Every day | ORAL | 6 refills | Status: DC

## 2019-10-31 MED ORDER — GLIPIZIDE 5 MG PO TABS: ORAL_TABLET | ORAL | 6 refills | Status: DC

## 2019-10-31 NOTE — Progress Notes (Signed)
Name: Kathy [name]  Age/ Sex: 64 y.o., female   MRN/ DOB: [id number], [birth year]     PCP: Caryl Ada   Reason for Endocrinology Evaluation: Type 2 Diabetes Mellitus  Initial Endocrine Consultative Visit: 05/31/2019    PATIENT IDENTIFIER: Kathy [name] is a 64 y.o. female with a past medical history of T2Dm, HTN, A.Fib. The patient has followed with Endocrinology clinic since 05/31/2019 for consultative assistance with management of her diabetes.  DIABETIC HISTORY:  [name] was diagnosed with T2DM in 2001. Wilder Glade, Glipizide (Stopped 02/2019), tradjenta, metfoRMIN . Rybelsus started 04/2019 but did not help so she stopped it and restarted metformin, in 05/2019 metformin was stopped again and was advised not to take it due to low GFR. Her hemoglobin A1c has ranged from 6.7% in 2015, peaking at 10.4% in 2020.   SUBJECTIVE:   During the last visit (07/26/2019): A1c 8.0%. We continued Glipizide and Tadjenta     Today (10/31/2019): Ms. [name] is here for a 3 month follow up on diabetes management.  She checks her blood sugars 2 times daily, preprandial to breakfast and bedtime. The patient has not had hypoglycemic episodes since the last clinic visit. Otherwise, the patient has not required any recent emergency interventions for hypoglycemia and has not had recent hospitalizations secondary to hyper or hypoglycemic episodes.    ROS: As per HPI and as detailed below: Review of Systems  HENT: Negative for congestion and sore throat.   Respiratory: Negative for cough and shortness of breath.   Cardiovascular: Negative for chest pain and palpitations.  Gastrointestinal: Negative for diarrhea and nausea.      HOME DIABETES REGIMEN:  Glipizide 5 mg, 1 tablet before Breakfast and 1 tablet before supper Tradjenta 2m daily    METER DOWNLOAD SUMMARY: Date range evaluated: 09/09/19-10/09/2019 Fingerstick Blood Glucose Tests = 63 Average Number Tests/Day =  2.0 Overall Mean FS Glucose = 190 Standard Deviation =43  BG Ranges: Low = 92 High = 316   Hypoglycemic Events/30 Days: BG < 50 = 0 Episodes of symptomatic severe hypoglycemia = 0    HISTORY:  Past Medical History:  Past Medical History:  Diagnosis Date  . Atrial fibrillation ([place] 05/2016  . Diabetes mellitus without complication ([facility]    type II  . History of Helicobacter pylori infection   . Hyperlipidemia   . Hypertension   . Microalbuminuria 2016  . Obesity    Past Surgical History:  Past Surgical History:  Procedure Laterality Date  . COLONOSCOPY     age 64yo   Social History:  reports that she has never smoked. She has never used smokeless tobacco. She reports current alcohol use. She reports that she does not use drugs. Family History:  Family History  Problem Relation Age of Onset  . Stroke Mother   . Cancer Father        throat  . Diabetes Sister   . Diabetes Sister   . Diabetes Sister   . Cancer Sister        breast  . Breast cancer Sister   . Heart disease Neg Hx      HOME MEDICATIONS: Allergies as of 10/31/2019   No Known Allergies     Medication List       Accurate as of October 31, 2019  4:21 PM. If you have any questions, ask your nurse or doctor.        allopurinol 100 MG tablet Commonly known as: Zyloprim  Take 1 tablet (100 mg total) by mouth daily.   amLODipine-olmesartan 5-20 MG tablet Commonly known as: AZOR Take 1 tablet by mouth daily.   atenolol 50 MG tablet Commonly known as: Tenormin Take 1 tablet (50 mg total) by mouth daily.   blood glucose meter kit and supplies Kit Dispense based on patient and insurance preference. Use up to four times daily as directed. (FOR ICD-9 250.00, 250.01).   glipiZIDE 5 MG tablet Commonly known as: GLUCOTROL Take 1 tablet (5 mg total) by mouth daily before breakfast AND 2 tablets (10 mg total) daily before supper. What changed: See the new instructions. Changed by: Dorita Sciara, MD   glucose blood test strip Contour Next test strips. Use as instructed   linagliptin 5 MG Tabs tablet Commonly known as: TRADJENTA Take 1 tablet (5 mg total) by mouth daily.   rosuvastatin 20 MG tablet Commonly known as: Crestor Take 1 tablet (20 mg total) by mouth at bedtime.        OBJECTIVE:   Vital Signs: BP 132/80 (BP Location: Left Arm, Patient Position: Sitting, Cuff Size: Large)   Pulse 60   Temp 98.6 F (37 C)   SpO2 98%   Wt Readings from Last 3 Encounters:  10/03/19 219 lb 3.2 oz (99.4 kg)  07/26/19 211 lb (95.7 kg)  07/06/19 211 lb 9.6 oz (96 kg)     Exam: General: Pt appears well and is in NAD  Lungs: Clear with good BS bilat with no rales, rhonchi, or wheezes  Heart: RRR with normal S1 and S2 and no gallops; no murmurs; no rub  Abdomen: Normoactive bowel sounds, soft, nontender, without masses or organomegaly palpable  Extremities: No pretibial edema.  Skin: Normal texture and temperature to palpation. No rash noted. No Acanthosis nigricans/skin tags. No lipohypertrophy.  Neuro: MS is good with appropriate affect, pt is alert and Ox3      DM foot exam: 05/31/2019  The skin of the feet is intact without sores or ulcerations. The pedal pulses are 2+ on right and 2+ on left. The sensation is intact to a screening 5.07, 10 gram monofilament bilaterally    DATA REVIEWED:  Lab Results  Component Value Date   HGBA1C 8.7 (H) 10/03/2019   HGBA1C 8.0 (A) 05/31/2019   HGBA1C 9.5 (H) 02/26/2019   Lab Results  Component Value Date   MICROALBUR 98.0 05/24/2016   LDLCALC 80 02/26/2019   CREATININE 1.57 (H) 10/03/2019   Lab Results  Component Value Date   MICRALBCREAT 2,891 (H) 02/26/2019     Lab Results  Component Value Date   CHOL 179 02/26/2019   HDL 80 02/26/2019   LDLCALC 80 02/26/2019   TRIG 96 02/26/2019   CHOLHDL 2.2 02/26/2019         ASSESSMENT / PLAN / RECOMMENDATIONS:   1) Type 2 Diabetes Mellitus, Sub-Optimally  controlled, With CKDIII complications - Most recent A1c of 8.7 %. Goal A1c < 7.0 %.    -Worsening hypoglycemia due to dietary indiscretions that is attributed to the holidays. -We will adjust the glipizide as below -Limited options for oral antiglycemic agents due to CKD 3  MEDICATIONS:   - Glipizide 5 mg, 1 tablet before Breakfast and 2 tablets before supper - Continue tradjenta 18m daily    EDUCATION / INSTRUCTIONS:  BG monitoring instructions: Patient is instructed to check her blood sugars 2 times a day, before breakfast and supper .  [facility] if: BG persistently <  70 or > 300. . I reviewed the Rule of 15 for the treatment of hypoglycemia in detail with the patient. Literature supplied.   F/U in 3 months    Signed electronically by: Mack Guise, MD  [facility] Endocrinology  [facility] [address] [place],  [place] [address] Phone: [phone] FAX: [phone]   CC: Carlena Hurl, PA-C [place] [address] Phone: [phone]  Fax: [phone]  Return to Endocrinology clinic as below: Future Appointments  Date Time Provider [place]  01/28/2020  9:30 AM Prudence Heiny, Melanie Crazier, MD [facility] None

## 2019-10-31 NOTE — Patient Instructions (Signed)
-  Glipizide 5 mg, 1 tablet before Breakfast and 2 tablets before supper - Continue tradjenta 5mg  daily    - Check sugar before Breakfast and Supper     HOW TO TREAT LOW BLOOD SUGARS (Blood sugar LESS THAN 70 MG/DL)  Please follow the RULE OF 15 for the treatment of hypoglycemia treatment (when your (blood sugars are less than 70 mg/dL)    STEP 1: Take 15 grams of carbohydrates when your blood sugar is low, which includes:   3-4 GLUCOSE TABS  OR  3-4 OZ OF JUICE OR REGULAR SODA OR  ONE TUBE OF GLUCOSE GEL     STEP 2: RECHECK blood sugar in 15 MINUTES STEP 3: If your blood sugar is still low at the 15 minute recheck --> then, go back to STEP 1 and treat AGAIN with another 15 grams of carbohydrates.

## 2019-11-01 ENCOUNTER — Other Ambulatory Visit: Payer: Self-pay | Admitting: Internal Medicine

## 2019-12-06 ENCOUNTER — Encounter: Payer: Self-pay | Admitting: Dietician

## 2019-12-06 ENCOUNTER — Other Ambulatory Visit: Payer: Self-pay

## 2019-12-06 ENCOUNTER — Encounter: Payer: 59 | Attending: Internal Medicine | Admitting: Dietician

## 2019-12-06 DIAGNOSIS — N1832 Chronic kidney disease, stage 3b: Secondary | ICD-10-CM | POA: Insufficient documentation

## 2019-12-06 DIAGNOSIS — E1121 Type 2 diabetes mellitus with diabetic nephropathy: Secondary | ICD-10-CM | POA: Insufficient documentation

## 2019-12-06 NOTE — Patient Instructions (Signed)
Eye exam Consider ways to decrease saturated fat.  Have meat only occasionally for breakfast Consider changing your bread to whole wheat/whole grain. Read the labels for your juice.  It is best to drink beverages that do not have carbohydrates. Add lunch (options include homemade vegetable soup, fruit, 1/2 sandwich) Recommend increasing your exercise to 30 minutes most days.   Aim for 3-4 Carb Choices per meal (45-60 grams) +/- 1 either way  Aim for 0 Carbs per snack if hungry  Consider reading food labels for Total Carbohydrate of foods Consider  increasing your activity level by walking for 30 minutes daily as tolerated Continue checking BG at alternate times per day  Continue taking medication as directed by MD

## 2019-12-06 NOTE — Progress Notes (Signed)
Diabetes Self-Management Education  Visit Type: First/Initial  Appt. Start Time: 0900 Appt. End Time: 1030  12/06/2019  Kathy [name], identified by name and date of birth, is a 64 y.o. female with a diagnosis of Diabetes: Type 2.   ASSESSMENT Patient is here today alone.  She would like to learn more about diet to improve her blood sugars.  History includes Type 2 Diabetes, CKD, HTN, HLD, gout Labs noted:  A1C:  8.7% 10/03/19 increased from 8% 9/17/20BUN 38, Creatinine 1.57, eGFR 40 10/03/19 Medication includes:  Glipizide, Tradjenta Fasting blood sugar >200 daily and 140 prior to dinner by restricting eating for 8 hours prior.  Patient lives with her husband and son.  She does the shopping and cooking.  She retired about 10 years ago.  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  Her oldest child died at age 68 of Type 1 Diabetes. She does not eat salad much as she doe snot have teeth. She has a treadmill for 20-30 minutes 4-5 days per week or walks around her house.  [name] 5' 8" (1.727 m), weight 224 lb (101.6 kg). Body mass index is 34.06 kg/m.  Diabetes Self-Management Education - 12/06/19 0918      Visit Information   Visit Type  First/Initial      Initial Visit   Diabetes Type  Type 2    Are you currently following a meal plan?  No    Are you taking your medications as prescribed?  Yes    Date Diagnosed  1999      Health Coping   How would you rate your overall health?  Good      Psychosocial Assessment   Patient Belief/Attitude about Diabetes  Motivated to manage diabetes    Self-care barriers  None    Self-management support  Doctor's office;Family    Other persons present  Patient    Patient Concerns  Nutrition/Meal planning;Glycemic Control    Special Needs  None    Preferred Learning Style  No preference indicated    Learning Readiness  Ready    How often do you need to have someone help you when you read instructions, pamphlets, or other written materials from your  doctor or pharmacy?  1 - Never    What is the last grade level you completed in school?  12th grade      Pre-Education Assessment   Patient understands the diabetes disease and treatment process.  Needs Review    Patient understands incorporating nutritional management into lifestyle.  Needs Review    Patient undertands incorporating physical activity into lifestyle.  Needs Review    Patient understands using medications safely.  Needs Review    Patient understands monitoring blood glucose, interpreting and using results  Needs Review    Patient understands prevention, detection, and treatment of acute complications.  Needs Review    Patient understands prevention, detection, and treatment of chronic complications.  Needs Review    Patient understands how to develop strategies to address psychosocial issues.  Needs Review    Patient understands how to develop strategies to promote health/change behavior.  Needs Review      Complications   Last HgB A1C per patient/outside source  8.7 %   09/2019 increased from 8% 06/2020   How often do you check your blood sugar?  1-2 times/day    Fasting Blood glucose range (mg/dL)  >200   140 before dinner   Number of hypoglycemic episodes per month  0  Number of hyperglycemic episodes per week  7    Can you tell when your blood sugar is high?  No    Have you had a dilated eye exam in the past 12 months?  No    Have you had a dental exam in the past 12 months?  No    Are you checking your feet?  Yes    How many days per week are you checking your feet?  1      Dietary Intake   Breakfast  2 eggs with olive oil spray, 2 strips bacon or 1 sausage, 2 slices white wheat bread sprayed with olive oil, orange or grapes, occasional grits coffee with splenda   9-10   Snack (morning)  none    Lunch  skips lunch    Snack (afternoon)  none    Dinner  roast chicken (or weekly fried chicken) or fish or [place] or occasional pork, rice or sweet potato, (cinnamon,  splenda, olive oil spray), cooked vegetable OR spaghetti with meat sauce, AND occasional chocolate or plain donut   5-6 pm   Snack (evening)  rare grapes or orange or chocolate    Beverage(s)  water, lite cranberry juice, lite OJ, decaf coffee with splenda, fat free sugar free cream      Exercise   Exercise Type  Light (walking / raking leaves)    How many days per week to you exercise?  4    How many minutes per day do you exercise?  30    Total minutes per week of exercise  120      Patient Education   Previous Diabetes Education  Yes (please comment)   2000 when diagnosed   Disease state   Other (comment)   review   Nutrition management   Role of diet in the treatment of diabetes and the relationship between the three main macronutrients and blood glucose level;Food label reading, portion sizes and measuring food.;Carbohydrate counting;Meal options for control of blood glucose level and chronic complications.;Information on hints to eating out and maintain blood glucose control.    Physical activity and exercise   Role of exercise on diabetes management, blood pressure control and cardiac health.    Medications  Reviewed patients medication for diabetes, action, purpose, timing of dose and side effects.    Monitoring  Taught/discussed recording of test results and interpretation of SMBG.;Identified appropriate SMBG and/or A1C goals.;Daily foot exams;Yearly dilated eye exam    Acute complications  Taught treatment of hypoglycemia - the 15 rule.    Chronic complications  Relationship between chronic complications and blood glucose control;Retinopathy and reason for yearly dilated eye exams    Psychosocial adjustment  Role of stress on diabetes;Identified and addressed patients feelings and concerns about diabetes      Individualized Goals (developed by patient)   Nutrition  General guidelines for healthy choices and portions discussed;Follow meal plan discussed    Physical Activity   Exercise 5-7 days per week;30 minutes per day    Medications  take my medication as prescribed    Monitoring   test my blood glucose as discussed    Reducing Risk  do foot checks daily;increase portions of healthy fats    Health Coping  discuss diabetes with (comment)   MD,RD, CDE     Post-Education Assessment   Patient understands the diabetes disease and treatment process.  Demonstrates understanding / competency    Patient understands incorporating nutritional management into lifestyle.  Needs Review  Patient undertands incorporating physical activity into lifestyle.  Demonstrates understanding / competency    Patient understands using medications safely.  Demonstrates understanding / competency    Patient understands monitoring blood glucose, interpreting and using results  Demonstrates understanding / competency    Patient understands prevention, detection, and treatment of acute complications.  Demonstrates understanding / competency    Patient understands prevention, detection, and treatment of chronic complications.  Demonstrates understanding / competency    Patient understands how to develop strategies to address psychosocial issues.  Demonstrates understanding / competency    Patient understands how to develop strategies to promote health/change behavior.  Needs Review      Outcomes   Expected Outcomes  Demonstrated interest in learning. Expect positive outcomes    Future DMSE  3-4 months    Program Status  Not Completed       Individualized Plan for Diabetes Self-Management Training:   Learning Objective:  Patient will have a greater understanding of diabetes self-management. Patient education plan is to attend individual and/or group sessions per assessed needs and concerns.   Plan:   Patient Instructions  Eye exam Consider ways to decrease saturated fat.  Have meat only occasionally for breakfast Consider changing your bread to whole wheat/whole grain. Read the  labels for your juice.  It is best to drink beverages that do not have carbohydrates. Add lunch (options include homemade vegetable soup, fruit, 1/2 sandwich) Recommend increasing your exercise to 30 minutes most days.   Aim for 3-4 Carb Choices per meal (45-60 grams) +/- 1 either way  Aim for 0 Carbs per snack if hungry  Consider reading food labels for Total Carbohydrate of foods Consider  increasing your activity level by walking for 30 minutes daily as tolerated Continue checking BG at alternate times per day  Continue taking medication as directed by MD      Expected Outcomes:  Demonstrated interest in learning. Expect positive outcomes  Education material provided: ADA - How to Thrive: A Guide for Your Journey with Diabetes, Food label handouts and Meal plan card, low purine nutrition therapy from AND  If problems or questions, patient to contact team via:  Phone  Future DSME appointment: 3-4 months

## 2020-01-28 ENCOUNTER — Ambulatory Visit: Payer: Self-pay | Admitting: Internal Medicine

## 2020-02-01 ENCOUNTER — Other Ambulatory Visit: Payer: Self-pay

## 2020-02-01 ENCOUNTER — Encounter: Payer: Self-pay | Admitting: Internal Medicine

## 2020-02-01 ENCOUNTER — Ambulatory Visit (INDEPENDENT_AMBULATORY_CARE_PROVIDER_SITE_OTHER): Payer: 59 | Admitting: Internal Medicine

## 2020-02-01 VITALS — BP 158/104 | HR 57 | Temp 98.4°F | Ht 68.0 in | Wt 217.8 lb

## 2020-02-01 DIAGNOSIS — E1121 Type 2 diabetes mellitus with diabetic nephropathy: Secondary | ICD-10-CM | POA: Diagnosis not present

## 2020-02-01 DIAGNOSIS — N1832 Chronic kidney disease, stage 3b: Secondary | ICD-10-CM

## 2020-02-01 DIAGNOSIS — E1165 Type 2 diabetes mellitus with hyperglycemia: Secondary | ICD-10-CM | POA: Insufficient documentation

## 2020-02-01 DIAGNOSIS — D3502 Benign neoplasm of left adrenal gland: Secondary | ICD-10-CM | POA: Insufficient documentation

## 2020-02-01 LAB — BASIC METABOLIC PANEL
BUN: 50 mg/dL — ABNORMAL HIGH (ref 6–23)
CO2: 28 mEq/L (ref 19–32)
Calcium: 10.1 mg/dL (ref 8.4–10.5)
Chloride: 103 mEq/L (ref 96–112)
Creatinine, Ser: 1.89 mg/dL — ABNORMAL HIGH (ref 0.40–1.20)
GFR: 32.43 mL/min — ABNORMAL LOW (ref >60.00)
Glucose, Bld: 247 mg/dL — ABNORMAL HIGH (ref 70–99)
Potassium: 4.4 mEq/L (ref 3.5–5.1)
Sodium: 133 mEq/L — ABNORMAL LOW (ref 135–145)

## 2020-02-01 LAB — POCT GLYCOSYLATED HEMOGLOBIN (HGB A1C): Hemoglobin A1C: 9.4 % — AB (ref 4.0–5.6)

## 2020-02-01 MED ORDER — OZEMPIC (0.25 OR 0.5 MG/DOSE) 2 MG/1.5ML ~~LOC~~ SOPN: 0.5000 mg | PEN_INJECTOR | SUBCUTANEOUS | 6 refills | Status: DC

## 2020-02-01 NOTE — Patient Instructions (Addendum)
-  Glipizide 5 mg, 1 tablet before Breakfast and 2 tablets before supper - Stop tradjenta  - Start Ozempic 0.25 mg once weekly for 6 weeks, then increase to 0.5 mg once weekly   24-Hour Urine Collection  You will be collecting your urine for a 24-hour period of time. Your timer starts with your first urine of the morning (For example - If you first pee at [place], your timer will start at [place]) McGrew away your first urine of the morning Collect your urine every time you pee for the next 24 hours  STOP your urine collection 24 hours after you started the collection (For example - You would stop at 9AM the day after you started)        HOW TO TREAT LOW BLOOD SUGARS (Blood sugar LESS THAN 70 MG/DL)  Please follow the RULE OF 15 for the treatment of hypoglycemia treatment (when your (blood sugars are less than 70 mg/dL)    STEP 1: Take 15 grams of carbohydrates when your blood sugar is low, which includes:   3-4 GLUCOSE TABS  OR  3-4 OZ OF JUICE OR REGULAR SODA OR  ONE TUBE OF GLUCOSE GEL     STEP 2: RECHECK blood sugar in 15 MINUTES STEP 3: If your blood sugar is still low at the 15 minute recheck --> then, go back to STEP 1 and treat AGAIN with another 15 grams of carbohydrates.

## 2020-02-01 NOTE — Progress Notes (Signed)
Name: Kathy [name]  Age/ Sex: 64 y.o., female   MRN/ DOB: [id number], 08/26/1956     PCP: Caryl Ada   Reason for Endocrinology Evaluation: Type 2 Diabetes Mellitus  Initial Endocrine Consultative Visit: 05/31/2019    PATIENT IDENTIFIER: Kathy [name] is a 64 y.o. female with a past medical history of T2Dm, HTN, A.Fib. The patient has followed with Endocrinology clinic since 05/31/2019 for consultative assistance with management of her diabetes.  DIABETIC HISTORY:  Kathy [name] was diagnosed with T2DM in 2001. [place] Glade, Glipizide (Stopped 02/2019), tradjenta, metfoRMIN . Rybelsus started 04/2019 but did not help so she stopped it and restarted metformin, in 05/2019 metformin was stopped again and was advised not to take it due to low GFR. Her hemoglobin A1c has ranged from 6.7% in 2015, peaking at 10.4% in 2020.   SUBJECTIVE:   During the last visit (10/31/2019): A1c 8.7 %. We continued Tadjenta and increased Glipizide     Today (02/01/2020): Kathy [name] is here for a 3 month follow up on diabetes management.  She checks her blood sugars 2 times daily, preprandial to breakfast and bedtime. The patient has not had hypoglycemic episodes since the last clinic visit. Otherwise, the patient has not required any recent emergency interventions for hypoglycemia and has not had recent hospitalizations secondary to hyper or hypoglycemic episodes.    ROS: As per HPI and as detailed below: Review of Systems  HENT: Negative for congestion and sore throat.   Respiratory: Negative for cough and shortness of breath.   Cardiovascular: Negative for chest pain and palpitations.  Gastrointestinal: Negative for diarrhea and nausea.      HOME DIABETES REGIMEN:  Glipizide 5 mg, 1 tablet before Breakfast and 2 tablet before supper Tradjenta 17m daily     GLUCOSE LOG :  128-220 mg/dL    HISTORY:  Past Medical History:  Past Medical History:  Diagnosis Date  . Atrial  fibrillation ([place] 05[place]  . Diabetes mellitus without complication ([place]    type II  . History of Helicobacter pylori infection   . Hyperlipidemia   . Hypertension   . Microalbuminuria 2016  . Obesity    Past Surgical History:  Past Surgical History:  Procedure Laterality Date  . COLONOSCOPY     age 64yo   Social History:  reports that she has never smoked. She has never used smokeless tobacco. She reports current alcohol use. She reports that she does not use drugs. Family History:  Family History  Problem Relation Age of Onset  . Stroke Mother   . Cancer Father        throat  . Diabetes Sister   . Diabetes Sister   . Diabetes Sister   . Cancer Sister        breast  . Breast cancer Sister   . Heart disease Neg Hx      HOME MEDICATIONS: Allergies as of 02/01/2020   No Known Allergies     Medication List       Accurate as of Feb 01, 2020  5:40 PM. If you have any questions, ask your nurse or doctor.        STOP taking these medications   linagliptin 5 MG Tabs tablet Commonly known as: TRADJENTA Stopped by: IDorita Sciara MD     TAKE these medications   allopurinol 100 MG tablet Commonly known as: Zyloprim Take 1 tablet (100 mg total) by mouth daily.   amLODipine-olmesartan 5-20 MG  tablet Commonly known as: AZOR Take 1 tablet by mouth daily.   atenolol 50 MG tablet Commonly known as: Tenormin Take 1 tablet (50 mg total) by mouth daily.   blood glucose meter kit and supplies Kit Dispense based on patient and insurance preference. Use up to four times daily as directed. (FOR ICD-9 250.00, 250.01).   cholecalciferol 25 MCG (1000 UNIT) tablet Commonly known as: VITAMIN D3 Take 2,000 Units by mouth daily.   ferrous sulfate 325 (65 FE) MG EC tablet Take 65 mg by mouth 3 (three) times daily with meals.   glipiZIDE 5 MG tablet Commonly known as: GLUCOTROL TAKE ONE TABLET BY MOUTH TWICE A DAY BEFORE A MEAL   glucose blood test strip Contour  Next test strips. Use as instructed   multivitamin with minerals Tabs tablet Take 1 tablet by mouth daily.   Ozempic (0.25 or 0.5 MG/DOSE) 2 MG/1.5ML Sopn Generic drug: Semaglutide(0.25 or 0.5MG/DOS) Inject 0.5 mg into the skin once a week. Started by: Dorita Sciara, MD   rosuvastatin 20 MG tablet Commonly known as: Crestor Take 1 tablet (20 mg total) by mouth at bedtime.   vitamin C 250 MG tablet Commonly known as: ASCORBIC ACID Take 250 mg by mouth daily.        OBJECTIVE:   Vital Signs: BP (!) 158/104 (BP Location: Left Arm, Patient Position: Sitting, Cuff Size: Normal)   Pulse (!) 57   Temp 98.4 F (36.9 C)   Ht 5' 8"  (1.727 m)   Wt 217 lb 12.8 oz (98.8 kg)   SpO2 92%   BMI 33.12 kg/m   Wt Readings from Last 3 Encounters:  02/01/20 217 lb 12.8 oz (98.8 kg)  12/06/19 224 lb (101.6 kg)  10/03/19 219 lb 3.2 oz (99.4 kg)     Exam: General: Pt appears well and is in NAD  Lungs: Clear with good BS bilat with no rales, rhonchi, or wheezes  Heart: RRR with normal S1 and S2 and no gallops; no murmurs; no rub  Abdomen: Normoactive bowel sounds, soft, nontender, without masses or organomegaly palpable  Extremities: No pretibial edema.  Skin: Normal texture and temperature to palpation. No rash noted. No Acanthosis nigricans/skin tags. No lipohypertrophy.  Neuro: MS is good with appropriate affect, pt is alert and Ox3      DM foot exam: 02/01/2020  The skin of the feet is intact without sores or ulcerations. The pedal pulses are 2+ on right and 2+ on left. The sensation is intact to a screening 5.07, 10 gram monofilament bilaterally    DATA REVIEWED:  Lab Results  Component Value Date   HGBA1C 9.4 (A) 02/01/2020   HGBA1C 8.7 (H) 10/03/2019   HGBA1C 8.0 (A) 05/31/2019   Lab Results  Component Value Date   MICROALBUR 98.0 05/24/2016   LDLCALC 80 02/26/2019   CREATININE 1.89 (H) 02/01/2020   Lab Results  Component Value Date   MICRALBCREAT 2,891  (H) 02/26/2019     Lab Results  Component Value Date   CHOL 179 02/26/2019   HDL 80 02/26/2019   LDLCALC 80 02/26/2019   TRIG 96 02/26/2019   CHOLHDL 2.2 02/26/2019       Results for Kathy [name] (MRN [id number]) as of 02/01/2020 17:40  Ref. Range 02/01/2020 10:49  Sodium Latest Ref Range: 135 - 145 mEq/L 133 (L)  Potassium Latest Ref Range: 3.5 - 5.1 mEq/L 4.4  Chloride Latest Ref Range: 96 - 112 mEq/L 103  CO2 Latest Ref Range:  19 - 32 mEq/L 28  Glucose Latest Ref Range: 70 - 99 mg/dL 247 (H)  BUN Latest Ref Range: 6 - 23 mg/dL 50 (H)  Creatinine Latest Ref Range: 0.40 - 1.20 mg/dL 1.89 (H)  Calcium Latest Ref Range: 8.4 - 10.5 mg/dL 10.1  GFR Latest Ref Range: >60.00 mL/min 32.43 (L)    MRI 2019 2. Left adrenal nodule is indeterminate.   ASSESSMENT / PLAN / RECOMMENDATIONS:   1) Type 2 Diabetes Mellitus, Poorly controlled, With CKDIII complications - Most recent A1c of 9.4 %. Goal A1c < 7.0 %.    -Worsening hyperglycemia , will adjust medications as below - Unfortunately we are limited with oral glycemic agents due to CKD III - Discussed switching Tradjenta to Ozempic, cautioned againt GI side effects, pt has no personal hx of pancreatitis.  -If her A1c continues to trend upwards we will have to consider insulin.   MEDICATIONS:   - Glipizide 5 mg, 1 tablet before Breakfast and 2 tablets before supper - Stop  tradjenta - Start Ozempic 0.25 mg weekly, for 6 weeks , then increase to 0.5 mg weekly    EDUCATION / INSTRUCTIONS:  BG monitoring instructions: Patient is instructed to check her blood sugars 2 times a day, before breakfast and supper .  Call [facility] if: BG persistently < 70 or > 300. . I reviewed the Rule of 15 for the treatment of hypoglycemia in detail with the patient. Literature supplied.  2. Left Adrenal Adenoma :   - Eighty five percent of adrenal adenomas are nonsecretory.  - Three forms of adrenal hyperfunction should  be considered in patients with adrenal incidentaloma   Glucocorticoid hypersecretion  Primary hyperaldosteronism  Pheochromocytoma  - Will proceed with CT scan , this is preferred to be with contrast but she has CKD III and would like to avoid contrast, MRI is an option but she declines this.  -We will proceed with Aldo: Renin ratio, as well as 24-hour urine collection for cortisol and catecholamines.   F/U in 3 months    Signed electronically by: Mack Guise, MD  [facility] Endocrinology  [facility] [address] [place], [place] [address] Phone: [phone] FAX: [phone]   CC: Carlena Hurl, PA-C [place] [address] Phone: [phone]  Fax: [phone]  Return to Endocrinology clinic as below: Future Appointments  Date Time Provider [place]  03/27/2020  9:00 AM Clydell Hakim, RD [place] NDM  05/08/2020 10:10 AM Jessen Siegman, Melanie Crazier, MD [facility] None

## 2020-02-04 ENCOUNTER — Other Ambulatory Visit: Payer: 59

## 2020-02-04 ENCOUNTER — Other Ambulatory Visit: Payer: Self-pay

## 2020-02-04 DIAGNOSIS — D3502 Benign neoplasm of left adrenal gland: Secondary | ICD-10-CM

## 2020-02-06 LAB — ALDOSTERONE + RENIN ACTIVITY W/ RATIO
ALDO / PRA Ratio: 0.3 Ratio — ABNORMAL LOW (ref 0.9–28.9)
Aldosterone: 1 ng/dL
Renin Activity: 3.24 ng/mL/h (ref 0.25–5.82)

## 2020-02-08 LAB — CATECHOLAMINES, FRACTIONATED, URINE, 24 HOUR
Calc Total (E+NE): 21 mcg/24 h — ABNORMAL LOW (ref 26–121)
Creatinine, Urine mg/day-CATEUR: 1.36 g/(24.h) (ref 0.50–2.15)
Dopamine 24 Hr Urine: 134 mcg/24 h (ref 52–480)
Norepinephrine, 24H, Ur: 21 mcg/24 h (ref 15–100)
Total Volume: 5900 mL

## 2020-02-08 LAB — CORTISOL, URINE, 24 HOUR
24 Hour urine volume (VMAHVA): 5900 mL
Cortisol (Ur), Free: 6.7 mcg/24 h (ref 4.0–50.0)
RESULTS RECEIVED: 1.4 g/(24.h) (ref 0.50–2.15)

## 2020-03-05 ENCOUNTER — Ambulatory Visit
Admission: RE | Admit: 2020-03-05 | Discharge: 2020-03-05 | Disposition: A | Discharge status: Home / Self Care | Payer: 59 | Source: Ambulatory Visit | Attending: Internal Medicine | Admitting: Internal Medicine

## 2020-03-05 ENCOUNTER — Other Ambulatory Visit: Payer: Self-pay

## 2020-03-05 DIAGNOSIS — D3502 Benign neoplasm of left adrenal gland: Secondary | ICD-10-CM

## 2020-03-07 ENCOUNTER — Encounter: Payer: Self-pay | Admitting: Internal Medicine

## 2020-03-07 ENCOUNTER — Telehealth: Payer: Self-pay | Admitting: Internal Medicine

## 2020-03-07 NOTE — Telephone Encounter (Signed)
Attempted to call pt on 6/25 /2021 at 1545 with no answer.   CT scan shows decrease in size of adrenal adenoma but has calcification of LAD. I would recommend cardiology evaluation.    A letter will be sent to the pt   Abby Nena Jordan, MD  [facility] Endocrinology  [facility] [address], [place] [address] Phone: [phone] FAX: [phone]

## 2020-03-27 ENCOUNTER — Encounter: Payer: 59 | Attending: Internal Medicine | Admitting: Dietician

## 2020-03-27 ENCOUNTER — Encounter: Payer: Self-pay | Admitting: Dietician

## 2020-03-27 ENCOUNTER — Other Ambulatory Visit: Payer: Self-pay

## 2020-03-27 DIAGNOSIS — N1832 Chronic kidney disease, stage 3b: Secondary | ICD-10-CM | POA: Insufficient documentation

## 2020-03-27 DIAGNOSIS — E1121 Type 2 diabetes mellitus with diabetic nephropathy: Secondary | ICD-10-CM | POA: Insufficient documentation

## 2020-03-27 NOTE — Progress Notes (Signed)
Diabetes Self-Management Education  Visit Type: Follow-up  Appt. Start Time: 0910 Appt. End Time: 5784  03/27/2020  [name], identified by name and date of birth, is a 64 y.o. female with a diagnosis of Diabetes:  .   ASSESSMENT Patient is here today alone.  She was last seen by myself 12/06/2019. She states that the adrenal tumor is benign.  Per MD note a cardiology evaluation is recommended due to calcification of the LAD noted on the CT scan. Since last visit she has:  -called about an eye appointment but states that her insurance did not cover this.  Advised her that she specifically ask for a diabetes eye exam  -been walking with a video or treadmill 20-30 minutes daily  -eating bacon and sausage less often  -reduced fried food  -reduced the amount of butter/margarine that she uses and using a non stick spray more often.  -decreased her salt intake    -checking her blood sugar each am (116-190)  and before dinner (116-140) which is reduced from last visit  -Weight decreased 5 lbs since her last visit 12/06/2019  History includes Type 2 Diabetes, CKD, HTN, HLD, gout Labs noted:  A1C:  9.4% 02/01/2020 increased from 8.7% 10/03/19  BUN 50, Creatinine 1.89, Potassium 4.4, GFR 32 02/01/2020 Medication includes:  Glipizide and Ozempic- Tradjenta has been discontinued Fasting blood sugar >200 daily and 140 prior to dinner by restricting eating for 8 hours prior.  Weight hx: 224 lbs today 224 lbs 12/06/2019  Patient lives with her husband and son.  She does the shopping and cooking.  She retired about 10 years ago.  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  Her oldest child died at age 78 of Type 1 Diabetes. She does not eat salad much as she doe snot have teeth. She has a treadmill for 20-30 minutes 4-5 days per week or walks around her house.  Weight 219 lb (99.3 kg). Body mass index is 33.3 kg/m.   Diabetes Self-Management Education - 03/27/20 1349      Visit Information   Visit  Type Follow-up      Psychosocial Assessment   Self-management support Doctor's office;Family    Other persons present Patient    Patient Concerns Nutrition/Meal planning;Glycemic Control;Weight Control    Learning Readiness Ready      Pre-Education Assessment   Patient understands the diabetes disease and treatment process. Demonstrates understanding / competency    Patient understands incorporating nutritional management into lifestyle. Needs Review    Patient undertands incorporating physical activity into lifestyle. Demonstrates understanding / competency    Patient understands using medications safely. Demonstrates understanding / competency    Patient understands monitoring blood glucose, interpreting and using results Demonstrates understanding / competency    Patient understands prevention, detection, and treatment of acute complications. Demonstrates understanding / competency    Patient understands prevention, detection, and treatment of chronic complications. Demonstrates understanding / competency    Patient understands how to develop strategies to address psychosocial issues. Demonstrates understanding / competency    Patient understands how to develop strategies to promote health/change behavior. Needs Review      Complications   Last HgB A1C per patient/outside source 9.4 %   02/01/2020 increased from 8.7% 10/03/19   How often do you check your blood sugar? 1-2 times/day    Fasting Blood glucose range (mg/dL) 130-179;70-129;180-200;>200    Postprandial Blood glucose range (mg/dL) 130-179;70-129    Number of hypoglycemic episodes per month 0  Number of hyperglycemic episodes per week 0    Have you had a dilated eye exam in the past 12 months? No      Dietary Intake   Breakfast egg sandwich on white wheat bread, occasional sausage, coffee with sugar sub, 1 cream    Lunch skips    Dinner grilled chicken, greens, potato or pasta OR fried fish, hush puppies or slaw    Snack  (evening) occasional snack cake or other dessert    Beverage(s) water, coffee      Exercise   Exercise Type Light (walking / raking leaves)    How many days per week to you exercise? 7    How many minutes per day do you exercise? 25    Total minutes per week of exercise 175      Patient Education   Previous Diabetes Education Yes (please comment)   12/06/2019 with myself   Nutrition management  Meal options for control of blood glucose level and chronic complications.;Food label reading, portion sizes and measuring food.;Other (comment)   discussion of renal basics   Physical activity and exercise  Other (comment)   encouraged continued exercise and effect on blood sugar   Medications Reviewed patients medication for diabetes, action, purpose, timing of dose and side effects.    Monitoring Yearly dilated eye exam    Chronic complications Relationship between chronic complications and blood glucose control   renal     Individualized Goals (developed by patient)   Nutrition General guidelines for healthy choices and portions discussed    Physical Activity Exercise 5-7 days per week    Medications take my medication as prescribed    Monitoring  test my blood glucose as discussed    Reducing Risk increase portions of healthy fats    Health Coping discuss diabetes with (comment)   MD, RD, CDCES     Patient Self-Evaluation of Goals - Patient rates self as meeting previously set goals (% of time)   Nutrition >75%    Physical Activity >75%    Medications >75%    Monitoring >75%    Problem Solving >75%    Reducing Risk >75%    Health Coping >75%      Post-Education Assessment   Patient understands the diabetes disease and treatment process. Demonstrates understanding / competency    Patient understands incorporating nutritional management into lifestyle. Needs Review    Patient undertands incorporating physical activity into lifestyle. Demonstrates understanding / competency    Patient  understands using medications safely. Demonstrates understanding / competency    Patient understands monitoring blood glucose, interpreting and using results Demonstrates understanding / competency    Patient understands prevention, detection, and treatment of acute complications. Demonstrates understanding / competency    Patient understands prevention, detection, and treatment of chronic complications. Demonstrates understanding / competency    Patient understands how to develop strategies to address psychosocial issues. Demonstrates understanding / competency    Patient understands how to develop strategies to promote health/change behavior. Demonstrates understanding / competency      Outcomes   Expected Outcomes Demonstrated interest in learning. Expect positive outcomes    Future DMSE PRN    Program Status Completed      Subsequent Visit   Since your last visit have you continued or begun to take your medications as prescribed? Yes    Since your last visit have you had your blood pressure checked? Yes    Since your last visit have you experienced any weight  changes? Loss    Weight Loss (lbs) 5    Since your last visit, are you checking your blood glucose at least once a day? Yes           Individualized Plan for Diabetes Self-Management Training:   Learning Objective:  Patient will have a greater understanding of diabetes self-management. Patient education plan is to attend individual and/or group sessions per assessed needs and concerns.   Plan:   Patient Instructions  Ask if a diabetic eye exam is covered by your insurance. Ask Dr. Kelton Pillar for a referral to a cardiologist. Avoid any seasonings with potassium in the ingredient list.  Continue to reduce/avoid added salt. Great job on reducing the amount of butter Recommend removing the skin from the chicken The meet portion should be small. Continue to stay active and aim for 30 minutes most days.       Expected  Outcomes:  Demonstrated interest in learning. Expect positive outcomes  Education material provided: Meal plan card  If problems or questions, patient to contact team via:  Phone  Future DSME appointment: PRN

## 2020-03-27 NOTE — Patient Instructions (Addendum)
Ask if a diabetic eye exam is covered by your insurance. Ask Dr. Kelton Pillar for a referral to a cardiologist. Avoid any seasonings with potassium in the ingredient list.  Continue to reduce/avoid added salt. Great job on reducing the amount of butter Recommend removing the skin from the chicken The meet portion should be small. Continue to stay active and aim for 30 minutes most days.

## 2020-03-29 ENCOUNTER — Other Ambulatory Visit: Payer: Self-pay | Admitting: Medical

## 2020-04-04 ENCOUNTER — Other Ambulatory Visit: Payer: Self-pay | Admitting: Medical

## 2020-04-04 ENCOUNTER — Other Ambulatory Visit: Payer: Self-pay

## 2020-04-04 MED ORDER — AMLODIPINE-OLMESARTAN 5-20 MG PO TABS: 1.0000 | ORAL_TABLET | Freq: Every day | ORAL | 2 refills | Status: DC

## 2020-04-15 ENCOUNTER — Telehealth: Payer: Self-pay | Admitting: Medical

## 2020-04-17 ENCOUNTER — Telehealth: Payer: Self-pay

## 2020-04-17 NOTE — Telephone Encounter (Signed)
Most of the time when she comes in her blood pressures are too high.  So no I do not think this is too much for her.  If she is not checking her blood pressures regularly and writing them down then she needs to be doing this so we know what her blood pressures are running.  If she has a list of blood pressure readings she can certainly send this sent via MyChart or fax and let me look at this.  If she feels she needs other opinion on this we can refer to cardiology

## 2020-04-17 NOTE — Telephone Encounter (Signed)
Pt. Aware to continue both BP medication and of recommendations. She was ok taking both medications.

## 2020-04-17 NOTE — Telephone Encounter (Signed)
Pt. Called stating that she is on Amlodipine/olmesartan and atenolol she wanted to know if she needs to be on both of these medications, she said it seems like a lot for her BP.

## 2020-04-19 NOTE — Telephone Encounter (Signed)
P.A. approved til 04/14/21 faxed pharmacy, pt informed

## 2020-05-08 ENCOUNTER — Ambulatory Visit (INDEPENDENT_AMBULATORY_CARE_PROVIDER_SITE_OTHER): Payer: 59 | Admitting: Internal Medicine

## 2020-05-08 ENCOUNTER — Other Ambulatory Visit: Payer: Self-pay

## 2020-05-08 VITALS — BP 150/80 | HR 77 | Ht 67.99 in | Wt 214.2 lb

## 2020-05-08 DIAGNOSIS — I515 Myocardial degeneration: Secondary | ICD-10-CM

## 2020-05-08 DIAGNOSIS — E1165 Type 2 diabetes mellitus with hyperglycemia: Secondary | ICD-10-CM | POA: Diagnosis not present

## 2020-05-08 DIAGNOSIS — D3502 Benign neoplasm of left adrenal gland: Secondary | ICD-10-CM | POA: Diagnosis not present

## 2020-05-08 LAB — POCT GLYCOSYLATED HEMOGLOBIN (HGB A1C)
HbA1c POC (<> result, manual entry): 8.3 % (ref 4.0–5.6)
HbA1c, POC (controlled diabetic range): 8.3 % — AB (ref 0.0–7.0)
HbA1c, POC (prediabetic range): 8.3 % — AB (ref 5.7–6.4)
Hemoglobin A1C: 8.3 % — AB (ref 4.0–5.6)

## 2020-05-08 NOTE — Patient Instructions (Signed)
-   Continue Glipizide 5 mg, 1 tablet before Breakfast and 2 tablets before supper - Increase Ozempic 0.5 mg once weekly       HOW TO TREAT LOW BLOOD SUGARS (Blood sugar LESS THAN 70 MG/DL)  Please follow the RULE OF 15 for the treatment of hypoglycemia treatment (when your (blood sugars are less than 70 mg/dL)    STEP 1: Take 15 grams of carbohydrates when your blood sugar is low, which includes:   3-4 GLUCOSE TABS  OR  3-4 OZ OF JUICE OR REGULAR SODA OR  ONE TUBE OF GLUCOSE GEL     STEP 2: RECHECK blood sugar in 15 MINUTES STEP 3: If your blood sugar is still low at the 15 minute recheck --> then, go back to STEP 1 and treat AGAIN with another 15 grams of carbohydrates.

## 2020-05-08 NOTE — Progress Notes (Signed)
Name: Kathy [name]  Age/ Sex: 64 y.o., female   MRN/ DOB: [id number], [birth year]     PCP: Caryl Ada   Reason for Endocrinology Evaluation: Type 2 Diabetes Mellitus  Initial Endocrine Consultative Visit: 05/31/2019    PATIENT IDENTIFIER: Kathy [name] is a 64 y.o. female with a past medical history of T2Dm, HTN, A.Fib. The patient has followed with Endocrinology clinic since 05/31/2019 for consultative assistance with management of her diabetes.  DIABETIC HISTORY:  Ms. Gidley was diagnosed with T2DM in 2001. [place] Glade, Glipizide (Stopped 02/2019), tradjenta, metfoRMIN . Rybelsus started 04/2019 but did not help so she stopped it and restarted metformin, in 05/2019 metformin was stopped again and was advised not to take it due to low GFR. Her hemoglobin A1c has ranged from 6.7% in 2015, peaking at 10.4% in 2020.  Ozempic started 01/2020  ADRENAL HISTORY: Pt was noted to have an incidental finding of a left adrenal adenoma 1.4 cm on MRI from 05/2018 during evaluation of a renal cysts. A CT scan of adrenal ( no contrast due to CKD and pt declined MRI 03/05/2020) showed an indeterminate left adrenal adenoma at 1 cm.    Labs were normal (01/2020) to include aldo, cortisol and metanephrines.   SUBJECTIVE:   During the last visit (02/01/2020): A1c 9.4 %. We stopped  Tadjenta, continued glipized and started Ozempic Glipizide     Today (05/08/2020): Ms. Casas is here for a 3 month follow up on diabetes management.  She checks her blood sugars 2 times daily, preprandial to breakfast and bedtime. The patient has not had hypoglycemic episodes since the last clinic visit. Otherwise, the patient has not required any recent emergency interventions for hypoglycemia and has not had recent hospitalizations secondary to hyper or hypoglycemic episodes.    Denies nausea or vomiting  Denies CP or sob  Denies LE swelling  Has left leg pain - taking tylenol  HOME DIABETES REGIMEN:   Glipizide 5 mg, 1 tablet before Breakfast and 2 tablet before supper Ozempic 0.25 mg weekly      GLUCOSE LOG :  106-152 mg/dL    HISTORY:  Past Medical History:  Past Medical History:  Diagnosis Date   Atrial fibrillation ([place]) 05/2016   Diabetes mellitus without complication ([place])    type II   History of Helicobacter pylori infection    Hyperlipidemia    Hypertension    Microalbuminuria 2016   Obesity    Past Surgical History:  Past Surgical History:  Procedure Laterality Date   COLONOSCOPY     age 68yo    Social History:  reports that she has never smoked. She has never used smokeless tobacco. She reports current alcohol use. She reports that she does not use drugs. Family History:  Family History  Problem Relation Age of Onset   Stroke Mother    Cancer Father        throat   Diabetes Sister    Diabetes Sister    Diabetes Sister    Cancer Sister        breast   Breast cancer Sister    Heart disease Neg Hx      HOME MEDICATIONS: Allergies as of 05/08/2020   No Known Allergies     Medication List       Accurate as of May 08, 2020  9:59 AM. If you have any questions, ask your nurse or doctor.        allopurinol 100 MG tablet  Commonly known as: Zyloprim Take 1 tablet (100 mg total) by mouth daily.   amLODipine-olmesartan 5-20 MG tablet Commonly known as: AZOR Take 1 tablet by mouth daily.   atenolol 50 MG tablet Commonly known as: Tenormin Take 1 tablet (50 mg total) by mouth daily.   blood glucose meter kit and supplies Kit Dispense based on patient and insurance preference. Use up to four times daily as directed. (FOR ICD-9 250.00, 250.01).   cholecalciferol 25 MCG (1000 UNIT) tablet Commonly known as: VITAMIN D3 Take 2,000 Units by mouth daily.   ferrous sulfate 325 (65 FE) MG EC tablet Take 65 mg by mouth 3 (three) times daily with meals.   glipiZIDE 5 MG tablet Commonly known as: GLUCOTROL TAKE ONE TABLET BY  MOUTH TWICE A DAY BEFORE A MEAL   glucose blood test strip Contour Next test strips. Use as instructed   multivitamin with minerals Tabs tablet Take 1 tablet by mouth daily.   Ozempic (0.25 or 0.5 MG/DOSE) 2 MG/1.5ML Sopn Generic drug: Semaglutide(0.25 or 0.5MG/DOS) Inject 0.5 mg into the skin once a week.   rosuvastatin 20 MG tablet Commonly known as: Crestor Take 1 tablet (20 mg total) by mouth at bedtime.   vitamin C 250 MG tablet Commonly known as: ASCORBIC ACID Take 250 mg by mouth daily.        OBJECTIVE:   Vital Signs: BP (!) 182/100 (BP Location: Left Arm, Patient Position: Sitting)    Pulse 77    Ht 5' 7.99" (1.727 m)    Wt 214 lb 3.2 oz (97.2 kg)    SpO2 99%    BMI 32.58 kg/m   Wt Readings from Last 3 Encounters:  05/08/20 214 lb 3.2 oz (97.2 kg)  03/27/20 219 lb (99.3 kg)  02/01/20 217 lb 12.8 oz (98.8 kg)     Exam: General: Pt appears well and is in NAD  Lungs: Clear with good BS bilat with no rales, rhonchi, or wheezes  Heart: RRR with normal S1 and S2 and no gallops; no murmurs; no rub  Abdomen: Normoactive bowel sounds, soft, nontender, without masses or organomegaly palpable  Extremities: No pretibial edema.  Skin: Normal texture and temperature to palpation. No rash noted. No Acanthosis nigricans/skin tags. No lipohypertrophy.  Neuro: MS is good with appropriate affect, pt is alert and Ox3      DM foot exam: 02/01/2020  The skin of the feet is intact without sores or ulcerations. The pedal pulses are 2+ on right and 2+ on left. The sensation is intact to a screening 5.07, 10 gram monofilament bilaterally    DATA REVIEWED:  Lab Results  Component Value Date   HGBA1C 8.3 (A) 05/08/2020   HGBA1C 8.3 05/08/2020   HGBA1C 8.3 (A) 05/08/2020   HGBA1C 8.3 (A) 05/08/2020   Lab Results  Component Value Date   MICROALBUR 98.0 05/24/2016   LDLCALC 80 02/26/2019   CREATININE 1.89 (H) 02/01/2020   Lab Results  Component Value Date    MICRALBCREAT 2,891 (H) 02/26/2019     Lab Results  Component Value Date   CHOL 179 02/26/2019   HDL 80 02/26/2019   LDLCALC 80 02/26/2019   TRIG 96 02/26/2019   CHOLHDL 2.2 02/26/2019       Results for DYNASTY, HOLQUIN MAE (MRN [id number]) as of 02/01/2020 17:40  Ref. Range 02/01/2020 10:49  Sodium Latest Ref Range: 135 - 145 mEq/L 133 (L)  Potassium Latest Ref Range: 3.5 - 5.1 mEq/L 4.4  Chloride Latest Ref  Range: 96 - 112 mEq/L 103  CO2 Latest Ref Range: 19 - 32 mEq/L 28  Glucose Latest Ref Range: 70 - 99 mg/dL 247 (H)  BUN Latest Ref Range: 6 - 23 mg/dL 50 (H)  Creatinine Latest Ref Range: 0.40 - 1.20 mg/dL 1.89 (H)  Calcium Latest Ref Range: 8.4 - 10.5 mg/dL 10.1  GFR Latest Ref Range: >60.00 mL/min 32.43 (L)    MRI 2019 2. Left adrenal nodule is indeterminate.   ASSESSMENT / PLAN / RECOMMENDATIONS:   1) Type 2 Diabetes Mellitus, Poorly controlled, With CKDIII complications - Most recent A1c of 8.3 %. Goal A1c < 7.0 %.    - Improved A1c down from 9.4%  - Unfortunately we are limited with oral glycemic agents due to CKD III - She is tolerating Ozempic without side effects, will increase as below   MEDICATIONS:   - Continue Glipizide 5 mg, 1 tablet before Breakfast and 2 tablets before supper - Increase Ozempic 0.5 mg weekly   EDUCATION / INSTRUCTIONS:  BG monitoring instructions: Patient is instructed to check her blood sugars 2 times a day, before breakfast and supper .  Call [facility] if: BG persistently < 70 or > 300.  I reviewed the Rule of 15 for the treatment of hypoglycemia in detail with the patient. Literature supplied.  2. Left Adrenal Adenoma :   - Stable imaging (02/2020) -   3. Cardiac calcifications:  - Pt asymptomatic  - She is on Rosuvastatin 20 mg daily  - LDL 80 mg/dL  - Will refer to cardiology for further workup     F/U in 3 months    Signed electronically by: Mack Guise, MD  [facility]  Endocrinology  [facility] [address] [place], [place] [address] Phone: [phone] FAX: [phone]   CC: Carlena Hurl, PA-C [address] [address] Phone: [phone]  Fax: [phone]  Return to Endocrinology clinic as below: Future Appointments  Date Time Provider [place]  05/08/2020 10:10 AM Kylah Maresh, Melanie Crazier, MD [id number] None

## 2020-06-04 ENCOUNTER — Other Ambulatory Visit: Payer: Self-pay

## 2020-06-04 ENCOUNTER — Encounter: Payer: Self-pay | Admitting: Internal Medicine

## 2020-06-04 ENCOUNTER — Ambulatory Visit (INDEPENDENT_AMBULATORY_CARE_PROVIDER_SITE_OTHER): Payer: 59 | Admitting: Internal Medicine

## 2020-06-04 ENCOUNTER — Telehealth: Payer: Self-pay | Admitting: Radiology

## 2020-06-04 VITALS — BP 178/80 | HR 69 | Ht 67.0 in | Wt 210.0 lb

## 2020-06-04 DIAGNOSIS — I2510 Atherosclerotic heart disease of native coronary artery without angina pectoris: Secondary | ICD-10-CM

## 2020-06-04 DIAGNOSIS — I480 Paroxysmal atrial fibrillation: Secondary | ICD-10-CM | POA: Diagnosis not present

## 2020-06-04 DIAGNOSIS — E7849 Other hyperlipidemia: Secondary | ICD-10-CM | POA: Diagnosis not present

## 2020-06-04 DIAGNOSIS — I2584 Coronary atherosclerosis due to calcified coronary lesion: Secondary | ICD-10-CM | POA: Insufficient documentation

## 2020-06-04 DIAGNOSIS — E119 Type 2 diabetes mellitus without complications: Secondary | ICD-10-CM | POA: Diagnosis not present

## 2020-06-04 DIAGNOSIS — D3502 Benign neoplasm of left adrenal gland: Secondary | ICD-10-CM | POA: Diagnosis not present

## 2020-06-04 DIAGNOSIS — I10 Essential (primary) hypertension: Secondary | ICD-10-CM

## 2020-06-04 MED ORDER — LABETALOL HCL 300 MG PO TABS: 300.0000 mg | ORAL_TABLET | Freq: Two times a day (BID) | ORAL | 6 refills | Status: DC

## 2020-06-04 NOTE — Progress Notes (Signed)
Cardiology Office Note:    Date:  06/04/2020   ID:  Kathy [name], DOB January 08, 1956, MRN [id number]  PCP:  Kathy Hurl, PA-C  [facility] Cardiologist:  Kathy Lean, MD  [facility] Electrophysiologist:  None   Referring MD: Kathy [name]*   CC: Not sure; hx of A fib  Consulted for the evaluation of coronary calcfications at the behest of Kathy Hurl, PA-C  History of Present Illness:    Kathy [name] is a 64 y.o. female with a hx of PAF ([place] 4 on no anticoagulation no AF going back to 05/31/2017 EKGs seen in AF clinic 9/17 on Xarelto), Diabetes with hypertension (a1c 8.3); Last BP 150/80) who presents to establish care.  Patient notes that she has been feeling well.  Patient is trying to exercise more, decrease salt intake, and eat better.  Patient is able to exercise 30 minutes a day, was on her treadmill last week without incident.  No chest pain, chest pressure, DOE, SOB, syncope.  No PND, orthopnea.  Limit in exercise is left hip pain.  Patient notes that in 2017 she felt her heart skipping.  Only lasted one day.  Since then, only feels that skipping when her blood sugar was too low.  Has not felt in to 2021.  Does not do ambulatory blood pressure monitoring.  No headaches, or chest pain.  Past Medical History:  Diagnosis Date  . Atrial fibrillation ([place]) 05/2016  . Diabetes mellitus without complication ([place])    type II  . History of Helicobacter pylori infection   . Hyperlipidemia   . Hypertension   . Microalbuminuria 2016  . Obesity    Past Surgical History:  Procedure Laterality Date  . COLONOSCOPY     age 1yo    Current Medications: Current Meds  Medication Sig  . allopurinol (ZYLOPRIM) 100 MG tablet Take 1 tablet (100 mg total) by mouth daily.  Marland Kitchen amLODipine-olmesartan (AZOR) 5-20 MG tablet Take 1 tablet by mouth daily.  Marland Kitchen atenolol (TENORMIN) 50 MG tablet Take 1 tablet (50 mg total) by mouth daily.  . blood  glucose meter kit and supplies KIT Dispense based on patient and insurance preference. Use up to four times daily as directed. (FOR ICD-9 250.00, 250.01).  . cholecalciferol (VITAMIN D3) 25 MCG (1000 UNIT) tablet Take 2,000 Units by mouth daily.  . ferrous sulfate 325 (65 FE) MG EC tablet Take 65 mg by mouth 3 (three) times daily with meals.  Marland Kitchen glipiZIDE (GLUCOTROL) 5 MG tablet Take 5 mg by mouth in the morning and at bedtime. 1 tab am, 2 tabs pm (64m total)  . glucose blood test strip Contour Next test strips. Use as instructed  . Multiple Vitamin (MULTIVITAMIN WITH MINERALS) TABS tablet Take 1 tablet by mouth daily.  . rosuvastatin (CRESTOR) 20 MG tablet Take 1 tablet (20 mg total) by mouth at bedtime.  . Semaglutide,0.25 or 0.5MG/DOS, (OZEMPIC, 0.25 OR 0.5 MG/DOSE,) 2 MG/1.5ML SOPN Inject 0.5 mg into the skin once a week.  . vitamin C (ASCORBIC ACID) 250 MG tablet Take 250 mg by mouth daily.    Allergies:   Patient has no known allergies.   Social History   Socioeconomic History  . Marital status: Married    Spouse name: Not on file  . Number of children: Not on file  . Years of education: Not on file  . Highest education level: Not on file  Occupational History  . Not on file  Tobacco Use  . Smoking status: Never Smoker  . Smokeless tobacco: Never Used  Substance and Sexual Activity  . Alcohol use: Yes    Comment: rarely  . Drug use: No  . Sexual activity: Not on file  Other Topics Concern  . Not on file  Social History Narrative   Lives with husband and son.   Retired.    Does housework, cooks, clean, busy.  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 Difficulty of Paying Living Expenses: Not on file  Food Insecurity:   . Worried About Charity fundraiser in the Last Year: Not on file  . Ran Out of Food in the Last Year: Not on file  Transportation Needs:   . Lack of Transportation (Medical): Not on file  . Lack of  Transportation (Non-Medical): Not on file  Physical Activity:   . Days of Exercise per Week: Not on file  . Minutes of Exercise per Session: Not on file  Stress:   . Feeling of Stress : Not on file  Social Connections:   . Frequency of Communication with Friends and Family: Not on file  . Frequency of Social Gatherings with Friends and Family: Not on file  . Attends Religious Services: Not on file  . Active Member of Clubs or Organizations: Not on file  . Attends Archivist Meetings: Not on file  . Marital Status: Not on file    Family History: The patient's family history includes Breast cancer in her sister; Cancer in her father and sister; Diabetes in her sister, sister, and sister; Stroke in her mother. There is no history of Heart disease.  ROS:   Please see the history of present illness.    All other systems reviewed and are negative.  EKGs/Labs/Other Studies Reviewed:    The following studies were reviewed today:  EKG:  EKG is  ordered today.  The ekg ordered today demonstrates SR rate 69, borderline hypertrophy.  Similar to prior in system going back to 2018.  Recent Labs: 10/03/2019: ALT 16 02/01/2020: BUN 50; Creatinine, Ser 1.89; Potassium 4.4; Sodium 133  Recent Lipid Panel    Component Value Date/Time   CHOL 179 02/26/2019 0950   TRIG 96 02/26/2019 0950   HDL 80 02/26/2019 0950   CHOLHDL 2.2 02/26/2019 0950   CHOLHDL 2.1 05/24/2016 1042   VLDL 15 05/24/2016 1042   LDLCALC 80 02/26/2019 0950   05/31/2016 Normal biventricular function no valvular pathology; mild La dilation CT Abdomen 03/05/20-  Incidental LAD calcium  Physical Exam:    VS:  BP (!) 178/80   Pulse 69   Ht 5' 7" (1.702 m)   Wt 210 lb (95.3 kg)   SpO2 99%   BMI 32.89 kg/m     Wt Readings from Last 3 Encounters:  06/04/20 210 lb (95.3 kg)  05/08/20 214 lb 3.2 oz (97.2 kg)  03/27/20 219 lb (99.3 kg)    GEN: Well nourished, well developed in no acute distress HEENT:  Normal NECK: No JVD; No carotid bruits LYMPHATICS: No lymphadenopathy CARDIAC: RRR, no murmurs, rubs, gallops RESPIRATORY:  Clear to auscultation without rales, wheezing or rhonchi  ABDOMEN: Soft, non-tender, non-distended MUSCULOSKELETAL:  No edema; No deformity  SKIN: Warm and dry NEUROLOGIC:  Alert and oriented x 3 PSYCHIATRIC:  Normal affect   ASSESSMENT:    1. PAF (paroxysmal atrial fibrillation) ([place])   2. Diabetes mellitus with coincident hypertension (  [place])   3. Adrenal adenoma, left   4. Other hyperlipidemia   5. Coronary artery calcification    PLAN:    In order of problems listed above:  HTN with adrenal adenoma (benign cyst) and CKD IIIB; with LAD calcium and no chest pain - No evidence of pheochromocyoma on testing and per endocrinology; risk of unopposed alpha crisis is therefor low - Given CKD; will stop atenolol and will start labetalol 300 mg BID; deferring MRA with present GFR - discussed diet changes (DASH and low salt diet) - patient to start ambulatory BP monitoring - repeat office BP:  162/80  PAF- last in 2017 - CHASDVASC 4 with no AC - will check ZioPatch; look at burden, and discuss risk; was symptomatic in the past - if any burden will restart Xarelto  HLD - LDL 80 on rosuvastatin 20 mg; will continue with goal < 100 unless CAD  6 months follow up unless new symptoms or abnormal test results warranting change in plan   Medication Adjustments/Labs and Tests Ordered: Current medicines are reviewed at length with the patient today.  Concerns regarding medicines are outlined above.  No orders of the defined types were placed in this encounter.  No orders of the defined types were placed in this encounter.   There are no Patient Instructions on file for this visit.   Signed, Kathy Lean, MD  06/04/2020 10:37 AM    [place]

## 2020-06-04 NOTE — Telephone Encounter (Signed)
Enrolled patient for a 14 day Zio XT  monitor to be mailed to patients home  °

## 2020-06-04 NOTE — Patient Instructions (Signed)
Medication Instructions:  START LABETALOL 300 MG 1 TAB TWICE DAILY STOP ATENOLOL  *If you need a refill on your cardiac medications before your next appointment, please call your pharmacy*   Lab Work: NONE If you have labs (blood work) drawn today and your tests are completely normal, you will receive your results only by: Marland Kitchen MyChart Message (if you have MyChart) OR . A paper copy in the mail If you have any lab test that is abnormal or we need to change your treatment, we will call you to review the results.   Your physician has recommended that you wear an event monitor. Event monitors are medical devices that record the heart's electrical activity. Doctors most often [place] these monitors to diagnose arrhythmias. Arrhythmias are problems with the speed or rhythm of the heartbeat. The monitor is a small, portable device. You can wear one while you do your normal daily activities. This is usually used to diagnose what is causing palpitations/syncope (passing out).  ZIO PATCH 14 DAY NON LIVE  Your next appointment:   6 month(s)  The format for your next appointment:   In Person  Provider:   Rudean Haskell, MD   Other Instructions

## 2020-06-09 ENCOUNTER — Telehealth: Payer: Self-pay | Admitting: Internal Medicine

## 2020-06-09 MED ORDER — HYDRALAZINE HCL 10 MG PO TABS: 10.0000 mg | ORAL_TABLET | Freq: Two times a day (BID) | ORAL | 2 refills | Status: DC

## 2020-06-09 NOTE — Telephone Encounter (Signed)
Pt c/o medication issue:  1. Name of Medication: labetalol (NORMODYNE) 300 MG tablet  2. How are you currently taking this medication (dosage and times per day)? 1 tablet twice a day  3. Are you having a reaction (difficulty breathing--STAT)? no  4. What is your medication issue? Patient states when she first took the medication her ears closed. She states she then had dizziness and felt like passing out and had some SOB. She states when moving around she felt faint. She states the pharmacy told her not to take it so she stopped and is feeling better.

## 2020-06-09 NOTE — Telephone Encounter (Signed)
Spoke with the patient and advised her to get an at home BP cuff to monitor her BP. She states she was planning to get one this week. Since patient declined lowering Labetalol Rx, Dr. Gasper Sells recommended hydralazine 10 mg BID. Pt agreeable to try this along with her regular dose of Atenolol. Patient aware to call back with any questions or concerns. New Rx sent.

## 2020-06-09 NOTE — Telephone Encounter (Signed)
I think perhaps the dose might have been a little too high. Would consider either reducing to 100mg  BID or have the pt take 1/2 tab (150mg ) BID. I will route to Dr. Gasper Sells for his input

## 2020-06-09 NOTE — Telephone Encounter (Signed)
Spoke with the patient and discussed Dr. Clovis Fredrickson recommendations with her.   "We can try a reduced dose at 100 mg BID.  Here pressure were SBP 160 mm Hg on the  Atenolol; so I hope she wasn't accidentally taking both"  Patient confirmed that she stopped taking her Atenolol when starting the Labetalol. Patient is adamant that her body can not tolerate Labetalol even at a lower dose. She states that she already tried cutting it in half (150 mg BID) and it did not help her symptoms. She feels that it would be unsafe for her to take this medication again. Advised the patient to restart her Atenolol for the time being until we can figure out another solution. Patient does not have a home BP cuff so she is unable to provide any vitals at this time.   Will route to [facility], MD for further advisement.

## 2020-06-09 NOTE — Addendum Note (Signed)
Addended by: Marcelle Overlie D on: 06/09/2020 02:33 PM   Modules accepted: Orders

## 2020-06-09 NOTE — Telephone Encounter (Signed)
Hi Team,  We can try a reduced dose at 100 mg BID.  Here pressure were SBP 160 mm Hg on the  Atenolol; so I hope she wasn't accidentally taking both.  I suspect we'll be under our BP goal at this dose, but we can try and slowly land the plane.  Does that sound reasonable?  [place]

## 2020-06-09 NOTE — Telephone Encounter (Signed)
Sounds like this could be a tough case.  She can return to her atenolol if she doesn't feel comfortable trying any dose of the labetalol.  We certainly would recommend her getting an ambulatory blood pressure cuff for home monitoring, especially considering the symptoms she had/.  We can offer her hydralazine 10 mg BID for blood pressure control since she had problems previously. If she is have further concerns, I'm happy to see her as a sooner follow up visit for blood pressure management.  Thanks for your help,  [facility]

## 2020-06-10 NOTE — Telephone Encounter (Signed)
Called and scheduled htn appt at [facility]

## 2020-06-10 NOTE — Telephone Encounter (Signed)
That would be great.  I'm sure she would benefit from a more hands on approach.  Thanks

## 2020-06-10 NOTE — Telephone Encounter (Signed)
Dr. Billee Cashing,  would you like [place] to see pt in our HTN clinic for more close monitoring of her BP and needed adjustments?

## 2020-06-16 ENCOUNTER — Other Ambulatory Visit (INDEPENDENT_AMBULATORY_CARE_PROVIDER_SITE_OTHER): Payer: 59

## 2020-06-16 DIAGNOSIS — I480 Paroxysmal atrial fibrillation: Secondary | ICD-10-CM | POA: Diagnosis not present

## 2020-06-25 ENCOUNTER — Ambulatory Visit: Payer: 59

## 2020-06-25 ENCOUNTER — Other Ambulatory Visit: Payer: Self-pay

## 2020-06-25 NOTE — Progress Notes (Unsigned)
Patient ID: Kathy [name]                 DOB: Mar 14, 1956                      MRN: [id number]     HPI: Kathy [name] is a 64 y.o. female referred by Dr. Gasper Sells to HTN clinic. PMH is significant for PAF (CHASDVASC 4, not on anticoagulation), DM (a1c 8.3), HTN, HLD, microalbuminuria, obesity, gout, benign adrenal adenoma (without pheochromocyoma) and CKD IIIB.   Patient was seen by Dr. Gasper Sells for evaluation of coronary calcifications 06/04/20. During office visit, patient was noted to have BP 178/80-162/80. As a result, the patient was instructed to initiate DASH diet, ambulatory BP monitoring, and to start labetalol 331m BID & stop atenolol. After initiating labetolol, the patient stated that she was having dizziness & SOB on this agent, which dissipated after stopping. The patient was unable to tolerate a reduced dose (1046mBID) Subsequently, the patient reinitiated atenolol. Since, the patient has been started on hydralazine 1036mID.   Ever had gout attack? - ask if she ever started insulin? 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 - denies smoking hx - denies illicit drug use - eToh: "rarely"   Diet:  06/04/20: patient reported trying to decrease salt intake & eat better  Exercise:  06/04/20: 30 min on treadmill daily (limited d/t left hip pain)  Home BP readings:   Wt Readings from Last 3 Encounters:  06/04/20 210 lb (95.3 kg)  05/08/20 214 lb 3.2 oz  (97.2 kg)  03/27/20 219 lb (99.3 kg)   BP Readings from Last 3 Encounters:  06/04/20 (!) 178/80  05/08/20 (!) 150/80  02/01/20 (!) 158/104   Pulse Readings from Last 3 Encounters:  06/04/20 69  05/08/20 77  02/01/20 (!) 57    Renal function: Scr 1.89, GFR 32.43  Past Medical History:  Diagnosis Date  . Atrial fibrillation (HCC) 09/2017[place] Diabetes mellitus without complication (HCC)    typeThomas  . History of Helicobacter pylori infection   . Hyperlipidemia   . Hypertension   . Microalbuminuria 2016  . Obesity     Current Outpatient Medications on File Prior to Visit  Medication Sig Dispense Refill  . allopurinol (ZYLOPRIM) 100 MG tablet Take 1 tablet (100 mg total) by mouth daily. 90 tablet 3  . amLODipine-olmesartan (AZOR) 5-20 MG tablet Take 1 tablet by mouth daily. 30 tablet 2  . atenolol (TENORMIN) 50 MG tablet Take 1 tablet (50 mg total) by mouth daily. 90 tablet 3  . blood glucose meter kit and supplies KIT Dispense based on patient and insurance preference. Use up to four times daily as directed. (FOR ICD-9 250.00, 250.01). 1 each 0  . cholecalciferol (VITAMIN D3) 25 MCG (1000 UNIT) tablet Take 2,000 Units by mouth daily.    . ferrous sulfate 325 (65 FE) MG EC tablet Take 65 mg by mouth 3 (three) times daily with meals.    . glipiZIDE Marland Kitchen  GLUCOTROL) 5 MG tablet Take 5 mg by mouth in the morning and at bedtime. 1 tab am, 2 tabs pm (46m total)    . glucose blood test strip Contour Next test strips. Use as instructed 100 each 6  . hydrALAZINE (APRESOLINE) 10 MG tablet Take 1 tablet (10 mg total) by mouth 2 (two) times daily. 60 tablet 2  . Multiple Vitamin (MULTIVITAMIN WITH MINERALS) TABS tablet Take 1 tablet by mouth daily.    . rosuvastatin (CRESTOR) 20 MG tablet Take 1 tablet (20 mg total) by mouth at bedtime. 90 tablet 3  . Semaglutide,0.25 or 0.5MG/DOS, (OZEMPIC, 0.25 OR 0.5 MG/DOSE,) 2 MG/1.5ML SOPN Inject 0.5 mg into the skin once a week. 1 pen 6  . vitamin C  (ASCORBIC ACID) 250 MG tablet Take 250 mg by mouth daily.     No current facility-administered medications on file prior to visit.    No Known Allergies  There were no vitals taken for this visit.   Assessment/Plan:  1. Hypertension -  - can increase olmesartan to 420mdaily - can increase amlodipine to 109maily   (double combo) - can increase hydralazine if tolerated - F/U labs in 1-2wks K, Scr  - baseline today, then again to see the effects 2 wks after initiating  - will call to discuss   Thank you  MelRamond Dialharm.D, BCPS, CPP [id number] [id number]: [phone]: [phone]

## 2020-06-29 ENCOUNTER — Other Ambulatory Visit: Payer: Self-pay | Admitting: Medical

## 2020-07-01 ENCOUNTER — Telehealth: Payer: Self-pay

## 2020-07-01 NOTE — Telephone Encounter (Signed)
Received a fax form [facility] for a refill on the pts. Olmesartan pts. Last apt was 10/03/19.

## 2020-07-01 NOTE — Telephone Encounter (Signed)
Patient is no longer on Olmesartan alone. She takes the combo.

## 2020-07-04 ENCOUNTER — Other Ambulatory Visit: Payer: Self-pay | Admitting: Medical

## 2020-07-07 ENCOUNTER — Other Ambulatory Visit: Payer: Self-pay | Admitting: Medical

## 2020-07-07 ENCOUNTER — Other Ambulatory Visit: Payer: Self-pay | Admitting: Internal Medicine

## 2020-07-07 NOTE — Telephone Encounter (Signed)
Rx have not been prescribed by Dr Kelton Pillar. Last seen on 05/08/2020. Next appointment on 09/18/2020

## 2020-08-02 ENCOUNTER — Other Ambulatory Visit: Payer: Self-pay | Admitting: Medical

## 2020-08-05 ENCOUNTER — Other Ambulatory Visit: Payer: Self-pay | Admitting: Medical

## 2020-08-12 ENCOUNTER — Other Ambulatory Visit: Payer: Self-pay | Admitting: Medical

## 2020-08-30 ENCOUNTER — Other Ambulatory Visit: Payer: Self-pay | Admitting: Internal Medicine

## 2020-08-30 ENCOUNTER — Other Ambulatory Visit: Payer: Self-pay | Admitting: Medical

## 2020-09-01 NOTE — Telephone Encounter (Signed)
Lmom asking patient to call and schedule a med check appointment and yearly physical for continued refills on her medications.

## 2020-09-11 ENCOUNTER — Other Ambulatory Visit: Payer: Self-pay | Admitting: Medical

## 2020-09-11 NOTE — Telephone Encounter (Signed)
Left message for pt to call back to schedule cpe soon. I will refill for 30 days only

## 2020-09-18 ENCOUNTER — Other Ambulatory Visit: Payer: Self-pay

## 2020-09-18 ENCOUNTER — Encounter: Payer: Self-pay | Admitting: Internal Medicine

## 2020-09-18 ENCOUNTER — Ambulatory Visit (INDEPENDENT_AMBULATORY_CARE_PROVIDER_SITE_OTHER): Payer: 59 | Admitting: Internal Medicine

## 2020-09-18 VITALS — BP 160/90 | HR 70 | Ht 67.0 in | Wt 211.5 lb

## 2020-09-18 DIAGNOSIS — Z23 Encounter for immunization: Secondary | ICD-10-CM | POA: Diagnosis not present

## 2020-09-18 DIAGNOSIS — D3502 Benign neoplasm of left adrenal gland: Secondary | ICD-10-CM

## 2020-09-18 DIAGNOSIS — E1165 Type 2 diabetes mellitus with hyperglycemia: Secondary | ICD-10-CM | POA: Diagnosis not present

## 2020-09-18 LAB — POCT GLYCOSYLATED HEMOGLOBIN (HGB A1C): Hemoglobin A1C: 7.2 % — AB (ref 4.0–5.6)

## 2020-09-18 NOTE — Patient Instructions (Signed)
-   Keep up the Good Work !! - Continue Glipizide 5 mg, 1 tablet before Breakfast and 2 tablets before supper - Continue Ozempic 0.5 mg once weekly       HOW TO TREAT LOW BLOOD SUGARS (Blood sugar LESS THAN 70 MG/DL)  Please follow the RULE OF 15 for the treatment of hypoglycemia treatment (when your (blood sugars are less than 70 mg/dL)    STEP 1: Take 15 grams of carbohydrates when your blood sugar is low, which includes:   3-4 GLUCOSE TABS  OR  3-4 OZ OF JUICE OR REGULAR SODA OR  ONE TUBE OF GLUCOSE GEL     STEP 2: RECHECK blood sugar in 15 MINUTES STEP 3: If your blood sugar is still low at the 15 minute recheck --> then, go back to STEP 1 and treat AGAIN with another 15 grams of carbohydrates.

## 2020-09-18 NOTE — Progress Notes (Addendum)
Name: Kathy [name]  Age/ Sex: 65 y.o., female   MRN/ DOB: [id number], 01-15-1956     PCP: Kathy [name]   Reason for Endocrinology Evaluation: Type 2 Diabetes Mellitus  Initial Endocrine Consultative Visit: 05/31/2019    PATIENT IDENTIFIER: Kathy [name] is a 65 y.o. female with a past medical history of T2Dm, HTN, A.Fib. The patient has followed with Endocrinology clinic since 05/31/2019 for consultative assistance with management of her diabetes.  DIABETIC HISTORY:  Kathy [name] was diagnosed with T2DM in 2001. Wilder Glade, Glipizide (Stopped 02/2019), tradjenta, metfoRMIN . Rybelsus started 04/2019 but did not help so she stopped it and restarted metformin, in 05/2019 metformin was stopped again and was advised not to take it due to low GFR. Her hemoglobin A1c has ranged from 6.7% in 2015, peaking at 10.4% in 2020.  Ozempic started 01/2020  ADRENAL HISTORY: Pt was noted to have an incidental finding of a left adrenal adenoma 1.4 cm on MRI from 05/2018 during evaluation of a renal cysts. A CT scan of adrenal ( no contrast due to CKD and pt declined MRI 03/05/2020) showed an indeterminate left adrenal adenoma at 1 cm.    Labs were normal (01/2020) to include aldo, cortisol and metanephrines.   SUBJECTIVE:   During the last visit (05/08/2020): A1c 8.3  continued glipized and increased Ozempic Glipizide     Today (09/18/2020): Kathy [name] is here for a 3 month follow up on diabetes management.  She checks her blood sugars 2 times daily, preprandial to breakfast and bedtime. The patient has not had hypoglycemic episodes since the last clinic visit.   Denies nausea or vomiting     HOME DIABETES REGIMEN:  Glipizide 5 mg, 1 tablet before Breakfast and 2 tablet before supper Ozempic 0.5 mg weekly      GLUCOSE LOG :  104-201 mg/dL    HISTORY:  Past Medical History:  Past Medical History:  Diagnosis Date  . Atrial fibrillation ([place]) 05/2016  . Diabetes mellitus  without complication ([place])    type II  . History of Helicobacter pylori infection   . Hyperlipidemia   . Hypertension   . Microalbuminuria 2016  . Obesity    Past Surgical History:  Past Surgical History:  Procedure Laterality Date  . COLONOSCOPY     age 70yo    Social History:  reports that she has never smoked. She has never used smokeless tobacco. She reports current alcohol use. She reports that she does not use drugs. Family History:  Family History  Problem Relation Age of Onset  . Stroke Mother   . Cancer Father        throat  . Diabetes Sister   . Diabetes Sister   . Diabetes Sister   . Cancer Sister        breast  . Breast cancer Sister   . Heart disease Neg Hx      HOME MEDICATIONS: Allergies as of 09/18/2020   No Known Allergies     Medication List       Accurate as of September 18, 2020 11:16 AM. If you have any questions, ask your nurse or doctor.        allopurinol 100 MG tablet Commonly known as: ZYLOPRIM TAKE ONE TABLET BY MOUTH DAILY   amLODipine-olmesartan 5-20 MG tablet Commonly known as: AZOR TAKE 1 TABLET BY MOUTH DAILY   atenolol 50 MG tablet Commonly known as: TENORMIN TAKE ONE TABLET BY MOUTH DAILY  blood glucose meter kit and supplies Kit Dispense based on patient and insurance preference. Use up to four times daily as directed. (FOR ICD-9 250.00, 250.01).   cholecalciferol 25 MCG (1000 UNIT) tablet Commonly known as: VITAMIN D3 Take 2,000 Units by mouth daily.   ferrous sulfate 325 (65 FE) MG EC tablet Take 65 mg by mouth 3 (three) times daily with meals.   glipiZIDE 5 MG tablet Commonly known as: GLUCOTROL Take 1 tablet (5 mg total) by mouth daily before breakfast AND 2 tablets (10 mg total) daily before supper.   glucose blood test strip Contour Next test strips. Use as instructed   hydrALAZINE 10 MG tablet Commonly known as: APRESOLINE TAKE 1 TABLET(10 MG) BY MOUTH TWICE DAILY   multivitamin with minerals Tabs  tablet Take 1 tablet by mouth daily.   Ozempic (0.25 or 0.5 MG/DOSE) 2 MG/1.5ML Sopn Generic drug: Semaglutide(0.25 or 0.5MG/DOS) INJECT 0.5 MG UNDER THE SKIN ONCE A WEEK   rosuvastatin 20 MG tablet Commonly known as: CRESTOR TAKE ONE TABLET BY MOUTH EVERY NIGHT AT BEDTIME   vitamin C 250 MG tablet Commonly known as: ASCORBIC ACID Take 250 mg by mouth daily.        OBJECTIVE:   Vital Signs: BP (!) 160/90   Pulse 70   Ht 5' 7"  (1.702 m)   Wt 211 lb 8 oz (95.9 kg)   SpO2 98%   BMI 33.13 kg/m   Wt Readings from Last 3 Encounters:  09/18/20 211 lb 8 oz (95.9 kg)  06/04/20 210 lb (95.3 kg)  05/08/20 214 lb 3.2 oz (97.2 kg)     Exam: General: Pt appears well and is in NAD  Lungs: Clear with good BS bilat with no rales, rhonchi, or wheezes  Heart: RRR with normal S1 and S2 and no gallops; no murmurs; no rub  Abdomen: Normoactive bowel sounds, soft, nontender, without masses or organomegaly palpable  Extremities: No pretibial edema.  Skin: Normal texture and temperature to palpation. No rash noted. No Acanthosis nigricans/skin tags. No lipohypertrophy.  Neuro: MS is good with appropriate affect, pt is alert and Ox3      DM foot exam: 09/18/2020  The skin of the feet is intact without sores or ulcerations. The pedal pulses are 2+ on right and 2+ on left. The sensation is intact to a screening 5.07, 10 gram monofilament bilaterally    DATA REVIEWED:  Lab Results  Component Value Date   HGBA1C 7.2 (A) 09/18/2020   HGBA1C 8.3 (A) 05/08/2020   HGBA1C 8.3 05/08/2020   HGBA1C 8.3 (A) 05/08/2020   HGBA1C 8.3 (A) 05/08/2020   Lab Results  Component Value Date   MICROALBUR 98.0 05/24/2016   LDLCALC 80 02/26/2019   CREATININE 1.89 (H) 02/01/2020   Lab Results  Component Value Date   MICRALBCREAT 2,891 (H) 02/26/2019     Lab Results  Component Value Date   CHOL 179 02/26/2019   HDL 80 02/26/2019   LDLCALC 80 02/26/2019   TRIG 96 02/26/2019   CHOLHDL 2.2  02/26/2019       Results for Kathy [name] (MRN [id number]) as of 02/01/2020 17:40  Ref. Range 02/01/2020 10:49  Sodium Latest Ref Range: 135 - 145 mEq/L 133 (L)  Potassium Latest Ref Range: 3.5 - 5.1 mEq/L 4.4  Chloride Latest Ref Range: 96 - 112 mEq/L 103  CO2 Latest Ref Range: 19 - 32 mEq/L 28  Glucose Latest Ref Range: 70 - 99 mg/dL 247 (H)  BUN Latest  Ref Range: 6 - 23 mg/dL 50 (H)  Creatinine Latest Ref Range: 0.40 - 1.20 mg/dL 1.89 (H)  Calcium Latest Ref Range: 8.4 - 10.5 mg/dL 10.1  GFR Latest Ref Range: >60.00 mL/min 32.43 (L)    CT scan 03/05/2020 Adrenals/Urinary Tract: Indeterminate 1 cm left adrenal nodule. This can be better characterized with MRI or dedicated adrenal protocol CT. The right adrenal gland is unremarkable. There is no hydronephrosis or nephrolithiasis on either side. Subcentimeter bilateral renal hypodense and slightly higher attenuating lesions are not characterized on this CT but may represent complex cysts. These can be better evaluated with MRI or ultrasound. The visualized ureters appear unremarkable.  ASSESSMENT / PLAN / RECOMMENDATIONS:   1) Type 2 Diabetes Mellitus, Optimally controlled, With CKD III complications - Most recent A1c of 7.2 %. Goal A1c < 7.0 %.    - Improved A1c down from 8.3 % , I have praised her on improved glycemic control  - Unfortunately we are limited with oral glycemic agents due to CKD III - She is tolerating Ozempic without side effects  MEDICATIONS:  - Continue Glipizide 5 mg, 1 tablet before Breakfast and 2 tablets before supper -  Continue Ozempic 0.5 mg weekly     EDUCATION / INSTRUCTIONS:  BG monitoring instructions: Patient is instructed to check her blood sugars 2 times a day, before breakfast and supper .  Call [facility] if: BG persistently < 70 . I reviewed the Rule of 15 for the treatment of hypoglycemia in detail with the patient. Literature supplied.  2. Left Adrenal  Adenoma :   - Stable imaging (02/2020) - No clinical or biochemical evidence of extra hormonal excretion.      F/U in 6 months    Signed electronically by: Mack Guise, MD  [facility]  [facility] [address] [place], [place] [address] Phone: [phone] FAX: [phone]   CC: Carlena Hurl, PA-C [place] [address] Phone: [phone]  Fax: [phone]  Return to Endocrinology clinic as below: No future appointments.

## 2020-09-30 ENCOUNTER — Other Ambulatory Visit: Payer: Self-pay | Admitting: Medical

## 2020-10-13 ENCOUNTER — Other Ambulatory Visit: Payer: Self-pay | Admitting: Medical

## 2020-10-13 ENCOUNTER — Telehealth: Payer: Self-pay | Admitting: Family Medicine

## 2020-10-13 NOTE — Telephone Encounter (Signed)
Will respond electronically

## 2020-10-13 NOTE — Telephone Encounter (Signed)
Patient has been left several messages to call and schedule appointment. Appointment is still not scheduled.

## 2020-10-13 NOTE — Telephone Encounter (Signed)
Kathy [name] req refill Allopurinol 100 mg and Rosuvastatin Calcium 20 mg  Patient needs CPE with fasting labs, called pt lmtrc.

## 2020-10-17 ENCOUNTER — Other Ambulatory Visit: Payer: Self-pay | Admitting: Medical

## 2020-10-17 ENCOUNTER — Telehealth: Payer: Self-pay | Admitting: Medical

## 2020-10-17 MED ORDER — ALLOPURINOL 100 MG PO TABS: 100.0000 mg | ORAL_TABLET | Freq: Every day | ORAL | 0 refills | Status: DC

## 2020-10-17 MED ORDER — ROSUVASTATIN CALCIUM 20 MG PO TABS: 20.0000 mg | ORAL_TABLET | Freq: Every day | ORAL | 0 refills | Status: DC

## 2020-10-17 NOTE — Telephone Encounter (Signed)
Done, get in for physical

## 2020-10-17 NOTE — Telephone Encounter (Signed)
Fax refill request from [employer]  Allopurinol 100 mg and Rosuvastatin 20 mg  Called pt and left voice mail that she needs to call and schedule appointment

## 2020-10-27 ENCOUNTER — Other Ambulatory Visit: Payer: Self-pay | Admitting: Medical

## 2020-11-07 ENCOUNTER — Other Ambulatory Visit: Payer: Self-pay | Admitting: Medical

## 2020-11-19 ENCOUNTER — Other Ambulatory Visit: Payer: Self-pay | Admitting: Internal Medicine

## 2020-11-21 ENCOUNTER — Other Ambulatory Visit: Payer: Self-pay | Admitting: Medical

## 2020-12-06 ENCOUNTER — Other Ambulatory Visit: Payer: Self-pay | Admitting: Medical

## 2020-12-22 ENCOUNTER — Other Ambulatory Visit: Payer: Self-pay | Admitting: Medical

## 2021-01-05 ENCOUNTER — Other Ambulatory Visit: Payer: Self-pay | Admitting: Medical

## 2021-01-20 ENCOUNTER — Other Ambulatory Visit: Payer: Self-pay | Admitting: Medical

## 2021-02-04 ENCOUNTER — Other Ambulatory Visit: Payer: Self-pay | Admitting: Medical

## 2021-02-11 ENCOUNTER — Other Ambulatory Visit: Payer: Self-pay | Admitting: Internal Medicine

## 2021-02-17 ENCOUNTER — Other Ambulatory Visit: Payer: Self-pay | Admitting: Medical

## 2021-03-03 ENCOUNTER — Ambulatory Visit (INDEPENDENT_AMBULATORY_CARE_PROVIDER_SITE_OTHER): Payer: 59 | Admitting: Medical

## 2021-03-03 ENCOUNTER — Other Ambulatory Visit (HOSPITAL_COMMUNITY)
Admission: RE | Admit: 2021-03-03 | Discharge: 2021-03-03 | Disposition: A | Discharge status: Home / Self Care | Payer: 59 | Source: Ambulatory Visit | Attending: Medical | Admitting: Medical

## 2021-03-03 ENCOUNTER — Encounter: Payer: Self-pay | Admitting: Medical

## 2021-03-03 ENCOUNTER — Other Ambulatory Visit: Payer: Self-pay

## 2021-03-03 VITALS — BP 170/90 | HR 92 | Ht 67.0 in | Wt 220.4 lb

## 2021-03-03 DIAGNOSIS — I2510 Atherosclerotic heart disease of native coronary artery without angina pectoris: Secondary | ICD-10-CM | POA: Diagnosis not present

## 2021-03-03 DIAGNOSIS — G4700 Insomnia, unspecified: Secondary | ICD-10-CM

## 2021-03-03 DIAGNOSIS — K219 Gastro-esophageal reflux disease without esophagitis: Secondary | ICD-10-CM

## 2021-03-03 DIAGNOSIS — D3502 Benign neoplasm of left adrenal gland: Secondary | ICD-10-CM | POA: Diagnosis not present

## 2021-03-03 DIAGNOSIS — I10 Essential (primary) hypertension: Secondary | ICD-10-CM

## 2021-03-03 DIAGNOSIS — E1169 Type 2 diabetes mellitus with other specified complication: Secondary | ICD-10-CM

## 2021-03-03 DIAGNOSIS — N1832 Chronic kidney disease, stage 3b: Secondary | ICD-10-CM | POA: Diagnosis not present

## 2021-03-03 DIAGNOSIS — E785 Hyperlipidemia, unspecified: Secondary | ICD-10-CM

## 2021-03-03 DIAGNOSIS — Z23 Encounter for immunization: Secondary | ICD-10-CM | POA: Diagnosis not present

## 2021-03-03 DIAGNOSIS — R809 Proteinuria, unspecified: Secondary | ICD-10-CM

## 2021-03-03 DIAGNOSIS — K069 Disorder of gingiva and edentulous alveolar ridge, unspecified: Secondary | ICD-10-CM

## 2021-03-03 DIAGNOSIS — E790 Hyperuricemia without signs of inflammatory arthritis and tophaceous disease: Secondary | ICD-10-CM | POA: Diagnosis not present

## 2021-03-03 DIAGNOSIS — Z0000 Encounter for general adult medical examination without abnormal findings: Secondary | ICD-10-CM

## 2021-03-03 DIAGNOSIS — E118 Type 2 diabetes mellitus with unspecified complications: Secondary | ICD-10-CM

## 2021-03-03 DIAGNOSIS — Z780 Asymptomatic menopausal state: Secondary | ICD-10-CM

## 2021-03-03 DIAGNOSIS — E2839 Other primary ovarian failure: Secondary | ICD-10-CM

## 2021-03-03 DIAGNOSIS — K5900 Constipation, unspecified: Secondary | ICD-10-CM

## 2021-03-03 DIAGNOSIS — Z124 Encounter for screening for malignant neoplasm of cervix: Secondary | ICD-10-CM

## 2021-03-03 DIAGNOSIS — E1165 Type 2 diabetes mellitus with hyperglycemia: Secondary | ICD-10-CM

## 2021-03-03 DIAGNOSIS — E559 Vitamin D deficiency, unspecified: Secondary | ICD-10-CM

## 2021-03-03 DIAGNOSIS — Z972 Presence of dental prosthetic device (complete) (partial): Secondary | ICD-10-CM

## 2021-03-03 DIAGNOSIS — I2584 Coronary atherosclerosis due to calcified coronary lesion: Secondary | ICD-10-CM

## 2021-03-03 DIAGNOSIS — Z1211 Encounter for screening for malignant neoplasm of colon: Secondary | ICD-10-CM

## 2021-03-03 DIAGNOSIS — E278 Other specified disorders of adrenal gland: Secondary | ICD-10-CM

## 2021-03-03 DIAGNOSIS — Z7185 Encounter for immunization safety counseling: Secondary | ICD-10-CM

## 2021-03-03 DIAGNOSIS — E1122 Type 2 diabetes mellitus with diabetic chronic kidney disease: Secondary | ICD-10-CM

## 2021-03-03 DIAGNOSIS — E119 Type 2 diabetes mellitus without complications: Secondary | ICD-10-CM

## 2021-03-03 LAB — POCT URINALYSIS DIP (PROADVANTAGE DEVICE)
Bilirubin, UA: NEGATIVE
Blood, UA: NEGATIVE
Glucose, UA: NEGATIVE mg/dL
Ketones, POC UA: NEGATIVE mg/dL
Leukocytes, UA: NEGATIVE
Nitrite, UA: NEGATIVE
Protein Ur, POC: >=300 mg/dL — AB
Specific Gravity, Urine: 1.01
Urobilinogen, Ur: 0.2
pH, UA: 6 (ref 5.0–8.0)

## 2021-03-03 LAB — RESULTS CONSOLE HPV: CHL HPV: NEGATIVE

## 2021-03-03 LAB — HM PAP SMEAR: HM Pap smear: NORMAL

## 2021-03-03 MED ORDER — ROSUVASTATIN CALCIUM 20 MG PO TABS: ORAL_TABLET | ORAL | 3 refills | Status: DC

## 2021-03-03 MED ORDER — LINACLOTIDE 145 MCG PO CAPS: 145.0000 ug | ORAL_CAPSULE | Freq: Every day | ORAL | 2 refills | Status: DC

## 2021-03-03 MED ORDER — ATENOLOL 50 MG PO TABS: 50.0000 mg | ORAL_TABLET | Freq: Every day | ORAL | 0 refills | Status: DC

## 2021-03-03 MED ORDER — AMLODIPINE-OLMESARTAN 10-40 MG PO TABS: 1.0000 | ORAL_TABLET | Freq: Every day | ORAL | 3 refills | Status: DC

## 2021-03-03 NOTE — Progress Notes (Signed)
Subjective:   HPI  Kathy [name] is a 65 y.o. female who presents for Chief Complaint  Patient presents with   Annual Exam    Physical with fasting labs     Patient Care Team: Fabiano Ginley, Camelia Eng, PA-C as PCP - General (Family Medicine) Werner Lean, MD as PCP - Cardiology (Cardiology) Roderic Palau, NP with [facility] Currently doesn't have dentist and eye doctor Dr. Collier Flowers, endocrinology   Concerns: Having constipation problems. - Linzess has worked ok in the past.   Has tried Miralax, Dulcolax .   Gynecological history: 2 pregnancies, 2 live births,  No current bleeding or vaginal concern.  No breast concerns.    She does check breasts regularly.   Hypertension - has seen cardiology since last visit here.   She ran out of Atenolol.  She is compliant with her other BP medications.    Diabetes - seeing endocrinology, has f/u in July  She is compliant with statin without c/o  She is compliant with vit D supplement  She is compliant with allopurinol  Exercises with 20 minutes of walking daily  Reviewed their medical, surgical, family, social, medication, and allergy history and updated chart as appropriate.  Past Medical History:  Diagnosis Date   Atrial fibrillation ([place]) 05/2016   Diabetes mellitus without complication ([place])    type II   History of Helicobacter pylori infection    Hyperlipidemia    Hypertension    Microalbuminuria 2016   Obesity     Family History  Problem Relation Age of Onset   Stroke Mother    Cancer Father        throat   Diabetes Sister    Diabetes Sister    Diabetes Sister    Cancer Sister        breast   Breast cancer Sister    Heart disease Neg Hx      Current Outpatient Medications:    allopurinol (ZYLOPRIM) 100 MG tablet, TAKE 1 TABLET(100 MG) BY MOUTH DAILY, Disp: 30 tablet, Rfl: 0   amLODipine-olmesartan (AZOR) 10-40 MG tablet, Take 1 tablet by mouth daily., Disp: 90 tablet, Rfl: 3   blood  glucose meter kit and supplies KIT, Dispense based on patient and insurance preference. Use up to four times daily as directed. (FOR ICD-9 250.00, 250.01)., Disp: 1 each, Rfl: 0   cholecalciferol (VITAMIN D3) 25 MCG (1000 UNIT) tablet, Take 2,000 Units by mouth daily., Disp: , Rfl:    ferrous sulfate 325 (65 FE) MG EC tablet, Take 65 mg by mouth 3 (three) times daily with meals., Disp: , Rfl:    glipiZIDE (GLUCOTROL) 5 MG tablet, Take 1 tablet (5 mg total) by mouth daily before breakfast AND 2 tablets (10 mg total) daily before supper., Disp: 270 tablet, Rfl: 3   glucose blood test strip, Contour Next test strips. Use as instructed, Disp: 100 each, Rfl: 6   hydrALAZINE (APRESOLINE) 10 MG tablet, TAKE 1 TABLET(10 MG) BY MOUTH TWICE DAILY, Disp: 60 tablet, Rfl: 8   linaclotide (LINZESS) 145 MCG CAPS capsule, Take 1 capsule (145 mcg total) by mouth daily before breakfast., Disp: 30 capsule, Rfl: 2   Multiple Vitamin (MULTIVITAMIN WITH MINERALS) TABS tablet, Take 1 tablet by mouth daily., Disp: , Rfl:    OZEMPIC, 0.25 OR 0.5 MG/DOSE, 2 MG/1.5ML SOPN, INJECT 0.5MG UNDER THE SKIN ONCE A WEEK, Disp: 1.5 mL, Rfl: 2   vitamin C (ASCORBIC ACID) 250 MG tablet, Take 250 mg by mouth  daily., Disp: , Rfl:    atenolol (TENORMIN) 50 MG tablet, Take 1 tablet (50 mg total) by mouth daily., Disp: 90 tablet, Rfl: 0   rosuvastatin (CRESTOR) 20 MG tablet, TAKE 1 TABLET(20 MG) BY MOUTH AT BEDTIME, Disp: 90 tablet, Rfl: 3  No Known Allergies     Review of Systems Constitutional: -fever, -chills, -sweats, -unexpected weight change, -decreased appetite, -fatigue Allergy: -sneezing, -itching, -congestion Dermatology: -changing moles, --rash, -lumps ENT: -runny nose, -ear pain, -sore throat, -hoarseness, -sinus pain, -teeth pain, - ringing in ears, -hearing loss, -nosebleeds Cardiology: -chest pain, -palpitations, -swelling, -difficulty breathing when lying flat, -waking up short of breath Respiratory: -cough, -shortness  of breath, -difficulty breathing with exercise or exertion, -wheezing, -coughing up blood Gastroenterology: -abdominal pain, -nausea, -vomiting, -diarrhea, +constipation, -blood in stool, -changes in bowel movement, -difficulty swallowing or eating Hematology: -bleeding, -bruising  Musculoskeletal: -joint aches, -muscle aches, -joint swelling, -back pain, -neck pain, -cramping, -changes in gait Ophthalmology: denies vision changes, eye redness, itching, discharge Urology: -burning with urination, -difficulty urinating, -blood in urine, -urinary frequency, -urgency, -incontinence Neurology: -headache, -weakness, -tingling, -numbness, -memory loss, -falls, -dizziness Psychology: -depressed mood, -agitation, -sleep problems Breast/gyn: -breast tendnerss, -discharge, -lumps, -vaginal discharge,- irregular periods, -heavy periods   Depression screen [facility] 2/9 03/03/2021 12/06/2019 03/29/2018 10/11/2017 01/21/2015  Decreased Interest 0 0 0 0 0  Down, Depressed, Hopeless 0 0 0 0 0  PHQ - 2 Score 0 0 0 0 0       Objective:  BP (!) 170/90   Pulse 92   Ht 5' 7"  (1.702 m)   Wt 220 lb 6.4 oz (100 kg)   SpO2 98%   BMI 34.52 kg/m   Wt Readings from Last 3 Encounters:  03/03/21 220 lb 6.4 oz (100 kg)  09/18/20 211 lb 8 oz (95.9 kg)  06/04/20 210 lb (95.3 kg)   BP Readings from Last 3 Encounters:  03/03/21 (!) 170/90  09/18/20 (!) 160/90  06/04/20 (!) 178/80    General appearance: alert, no distress, WD/WN, African American female Skin: dark coloration/possible tattooing from prior rash on forearms, no other worrisome lesions HEENT: normocephalic, conjunctiva/corneas normal, sclerae anicteric, PERRLA, EOMi,  Neck: supple, no lymphadenopathy, no thyromegaly, no masses, normal ROM, no bruits Chest: non tender, normal shape and expansion Heart: RRR, normal S1, S2, no murmurs Lungs: CTA bilaterally, no wheezes, rhonchi, or rales Abdomen: +bs, soft, non tender, non distended, no masses, no  hepatomegaly, no splenomegaly, no bruits Back: non tender, normal ROM, no scoliosis Musculoskeletal: upper extremities non tender, no obvious deformity, normal ROM throughout, lower extremities non tender, no obvious deformity, normal ROM throughout Extremities: no edema, no cyanosis, no clubbing Pulses: 2+ symmetric, upper and lower extremities, normal cap refill Neurological: alert, oriented x 3, CN2-12 intact, strength normal upper extremities and lower extremities, sensation normal throughout, DTRs 2+ throughout, no cerebellar signs, gait normal Psychiatric: normal affect, behavior normal, pleasant  Breast: nontender, no masses or lumps, no skin changes, no nipple discharge or inversion, no axillary lymphadenopathy Gyn: Normal external genitalia without lesions, vagina with normal mucosa, cervix without lesions, no cervical motion tenderness, no abnormal vaginal discharge.  Uterus and adnexa not enlarged, nontender, no masses.  Pap performed.  Exam chaperoned by nurse. Rectal: anus normal appearing   Diabetic Foot Exam - Simple   Simple Foot Form Diabetic Foot exam was performed with the following findings: Yes 03/03/2021  9:11 AM  Visual Inspection See comments: Yes Sensation Testing Intact to touch and monofilament testing bilaterally: Yes  Pulse Check Posterior Tibialis and Dorsalis pulse intact bilaterally: Yes Comments Callous of volar surface right great toe distal phalanx, right 5th MTP volar surface, left 5th MTP volar surface      Assessment and Plan :   Encounter Diagnoses  Name Primary?   Encounter for health maintenance examination in adult Yes   Elevated blood uric acid level    Coronary artery calcification    Stage 3b chronic kidney disease (HCC)    Adrenal adenoma, left    Constipation, unspecified constipation type    Vaccine counseling    Type 2 diabetes mellitus with stage 3b chronic kidney disease, without long-term current use of insulin (HCC)    Type 2  diabetes mellitus with hyperglycemia, without long-term current use of insulin (HCC)    Type 2 diabetes mellitus with complication, without long-term current use of insulin ([place])    Screen for colon cancer    Post-menopausal    Microalbuminuria    Left adrenal mass (HCC)    Insomnia, unspecified type    Gum disease    Estrogen deficiency    Gastroesophageal reflux disease, unspecified whether esophagitis present    Essential hypertension, benign    Diabetes mellitus with coincident hypertension ([place])    Wears dentures    Hyperlipidemia associated with type 2 diabetes mellitus ([place])    Vitamin D deficiency    Screening for cervical cancer      This visit was a preventative care visit, also known as wellness visit or routine physical.   Topics typically include healthy lifestyle, diet, exercise, preventative care, vaccinations, sick and well care, proper use of emergency dept and after hours care, as well as other concerns.     Recommendations: Continue to return yearly for your annual wellness and preventative care visits.  This gives [place] a chance to discuss healthy lifestyle, exercise, vaccinations, review your chart record, and perform screenings where appropriate.  Establish with ophthalmology soon.  I recommend you see your eye doctor yearly for routine vision care.  Please make sure they send [place] a copy of your eye report  Establish with dentist.  I recommend you see your dentist yearly for routine dental care including hygiene visits twice yearly.     Vaccination recommendations were reviewed Immunization History  Administered Date(s) Administered   Influenza,inj,Quad PF,6+ Mos 08/06/2013, 05/24/2016, 06/20/2018, 09/18/2020   PFIZER(Purple Top)SARS-COV-2 Vaccination 04/09/2020, 05/10/2020   Pneumococcal Polysaccharide-23 08/06/2013   Tdap 03/13/2014    I recommend yearly flu shot in the fall  You are due for pneumococcal 23 vaccine update, and you will be due for the other  pneumonia vaccine, Prevnar 13 vaccine next year 2023  Counseled on the pneumococcal vaccine.  Vaccine information sheet given.  Pneumococcal vaccine PPSV23 given after consent obtained.   Shingles vaccine:  I recommend you have a shingles vaccine to help prevent shingles or herpes zoster outbreak.   Please call your insurer to inquire about coverage for the Shingrix vaccine given in 2 doses.   Some insurers cover this vaccine after age 9, some cover this after age 22.  If your insurer covers this, then call to schedule appointment to have this vaccine here.    Screening for cancer: Colon cancer screening: I recommend updated colon cancer screen at this time.  Please call your insurance company to check coverage for colon cancer screening.  Options may include Cologard stool test or Colonoscopy.  You should also inquire about which facility the colonoscopy could be performed, and  coverage for diagnostic vs screening colonoscopy as coverage may vary.  If you have significant family history of colon cancer or blood in the stool, then you should only do the colonoscopy, not the cologard test.   Breast cancer screening: You should perform a self breast exam monthly.   We reviewed recommendations for regular mammograms and breast cancer screening. Please schedule mammogram.  The order has been put in the computer  Please call to schedule your mammogram.  [facility]  [phone] [address], [place], [place] [address]   Cervical cancer screening: We reviewed recommendations for pap smear screening. You are due for Pap smear.  We are updating this today  Skin cancer screening: Check your skin regularly for new changes, growing lesions, or other lesions of concern Come in for evaluation if you have skin lesions of concern.  Lung cancer screening: If you have a greater than 20 pack year history of tobacco use, then you may qualify for lung  cancer screening with a chest CT scan.   Please call your insurance company to inquire about coverage for this test.  We currently don't have screenings for other cancers besides breast, cervical, colon, and lung cancers.  If you have a strong family history of cancer or have other cancer screening concerns, please let me know.    Bone health: Get at least 150 minutes of aerobic exercise weekly Get weight bearing exercise at least once weekly Bone density test:  A bone density test is an imaging test that uses a type of X-ray to measure the amount of calcium and other minerals in your bones. The test may be used to diagnose or screen you for a condition that causes weak or thin bones (osteoporosis), predict your risk for a broken bone (fracture), or determine how well your osteoporosis treatment is working. The bone density test is recommended for females 19 and older, or females or males <83 if certain risk factors such as thyroid disease, long term use of steroids such as for asthma or rheumatological issues, vitamin D deficiency, estrogen deficiency, family history of osteoporosis, self or family history of fragility fracture in first degree relative.  Please call and schedule your bone density test  Please call to schedule your bone density test  [facility] [address] [place], [place] [address]    Heart health: Get at least 150 minutes of aerobic exercise weekly Limit alcohol It is important to maintain a healthy blood pressure and healthy cholesterol numbers  Heart disease screening: Screening for heart disease includes screening for blood pressure, fasting lipids, glucose/diabetes screening, BMI height to weight ratio, reviewed of smoking status, physical activity, and diet.    Goals include blood pressure 120/80 or less, maintaining a healthy lipid/cholesterol profile, preventing diabetes or keeping diabetes numbers  under good control, not smoking or using tobacco products, exercising most days per week or at least 150 minutes per week of exercise, and eating healthy variety of fruits and vegetables, healthy oils, and avoiding unhealthy food choices like fried food, fast food, high sugar and high cholesterol foods.    Other tests may possibly include EKG test, CT coronary calcium score, echocardiogram, exercise treadmill stress test.     Medical care options: I recommend you continue to seek care here first for routine care.  We try really hard to have available appointments Monday through Friday daytime hours for sick visits, acute visits,  and physicals.  Urgent care should be used for after hours and weekends for significant issues that cannot wait till the next day.  The emergency department should be used for significant potentially life-threatening emergencies.  The emergency department is expensive, can often have long wait times for less significant concerns, so try to utilize primary care, urgent care, or telemedicine when possible to avoid unnecessary trips to the emergency department.  Virtual visits and telemedicine have been introduced since the pandemic started in 2020, and can be convenient ways to receive medical care.  We offer virtual appointments as well to assist you in a variety of options to seek medical care.    Separate significant issues discussed: Regarding your appointments, I want to see you yearly for a physical.  I want to see you in 4 to 6 weeks to make sure your blood pressure is at goal since it is not at goal today.  In general we should probably see you twice yearly for routine follow-ups, yearly physical and in 42-monthmed check  You should continue to see your diabetes specialist regularly  You need to establish with eye doctor and get that appointment the way   High blood pressure-restart your medication atenolol, today I increased the dose of your amlodipine olmesartan,  continue hydralazine.  High cholesterol-continue Crestor daily.  We will update labs today  I am checking her vitamin D today to see if we need to stay on supplement at the same dose or not  Diabetes-follow-up with your specialist regularly, continue your current medications  Constipation-begin back on Linzess either daily or 3 days a week which I reviewed tolerate along with good water and fiber intake  Elevated uric acid-pending labs we will either continue allopurinol 100 mg or increase the dose if needed  In the future if you see you are going to run out of the medication, please call our office   Kathy [name] seen today for annual exam.  Diagnoses and all orders for this visit:  Encounter for health maintenance examination in adult -     MM DIGITAL SCREENING BILATERAL; Future -     DG Bone Density; Future -     Renal Function Panel -     Hepatic function panel -     Lipid panel -     CBC with Differential/Platelet -     Uric acid -     POCT Urinalysis DIP (Proadvantage Device) -     VITAMIN D 25 Hydroxy (Vit-D Deficiency, Fractures) -     Cytology - PAP([place])  Elevated blood uric acid level -     Uric acid -     POCT Urinalysis DIP (Proadvantage Device)  Coronary artery calcification  Stage 3b chronic kidney disease (HCC) -     Renal Function Panel  Adrenal adenoma, left  Constipation, unspecified constipation type  Vaccine counseling  Type 2 diabetes mellitus with stage 3b chronic kidney disease, without long-term current use of insulin (HCC)  Type 2 diabetes mellitus with hyperglycemia, without long-term current use of insulin (HCC)  Type 2 diabetes mellitus with complication, without long-term current use of insulin (HCC)  Screen for colon cancer  Post-menopausal -     DG Bone Density; Future  Microalbuminuria  Left adrenal mass (HCC)  Insomnia, unspecified type  Gum disease  Estrogen deficiency -     DG Bone Density;  Future  Gastroesophageal reflux disease, unspecified whether esophagitis present  Essential hypertension, benign  Diabetes mellitus with  coincident hypertension ([place])  Wears dentures  Hyperlipidemia associated with type 2 diabetes mellitus ([place]) -     Lipid panel  Vitamin D deficiency -     VITAMIN D 25 Hydroxy (Vit-D Deficiency, Fractures)  Screening for cervical cancer -     Cytology - PAP([place])  Other orders -     rosuvastatin (CRESTOR) 20 MG tablet; TAKE 1 TABLET(20 MG) BY MOUTH AT BEDTIME -     amLODipine-olmesartan (AZOR) 10-40 MG tablet; Take 1 tablet by mouth daily. -     atenolol (TENORMIN) 50 MG tablet; Take 1 tablet (50 mg total) by mouth daily. -     linaclotide (LINZESS) 145 MCG CAPS capsule; Take 1 capsule (145 mcg total) by mouth daily before breakfast.    Follow-up pending labs, yearly for physical

## 2021-03-03 NOTE — Addendum Note (Signed)
Addended by: Edgar Frisk on: 03/03/2021 02:31 PM   Modules accepted: Orders

## 2021-03-04 ENCOUNTER — Other Ambulatory Visit: Payer: Self-pay | Admitting: Medical

## 2021-03-04 LAB — CBC WITH DIFFERENTIAL/PLATELET
Basophils Absolute: 0 10*3/uL (ref 0.0–0.2)
Basos: 1 %
EOS (ABSOLUTE): 0.3 10*3/uL (ref 0.0–0.4)
Eos: 3 %
Hematocrit: 38.1 % (ref 34.0–46.6)
Hemoglobin: 12.4 g/dL (ref 11.1–15.9)
Immature Grans (Abs): 0 10*3/uL (ref 0.0–0.1)
Immature Granulocytes: 0 %
Lymphocytes Absolute: 2.4 10*3/uL (ref 0.7–3.1)
Lymphs: 33 %
MCH: 26.9 pg (ref 26.6–33.0)
MCHC: 32.5 g/dL (ref 31.5–35.7)
MCV: 83 fL (ref 79–97)
Monocytes Absolute: 0.5 10*3/uL (ref 0.1–0.9)
Monocytes: 7 %
Neutrophils Absolute: 4.2 10*3/uL (ref 1.4–7.0)
Neutrophils: 56 %
Platelets: 303 10*3/uL (ref 150–450)
RBC: 4.61 x10E6/uL (ref 3.77–5.28)
RDW: 13.7 % (ref 11.7–15.4)
WBC: 7.4 10*3/uL (ref 3.4–10.8)

## 2021-03-04 LAB — RENAL FUNCTION PANEL
Albumin: 4.4 g/dL (ref 3.8–4.8)
BUN/Creatinine Ratio: 17 (ref 12–28)
BUN: 32 mg/dL — ABNORMAL HIGH (ref 8–27)
CO2: 22 mmol/L (ref 20–29)
Calcium: 10.1 mg/dL (ref 8.7–10.3)
Chloride: 99 mmol/L (ref 96–106)
Creatinine, Ser: 1.89 mg/dL — ABNORMAL HIGH (ref 0.57–1.00)
Glucose: 211 mg/dL — ABNORMAL HIGH (ref 65–99)
Phosphorus: 3.5 mg/dL (ref 3.0–4.3)
Potassium: 5.4 mmol/L — ABNORMAL HIGH (ref 3.5–5.2)
Sodium: 135 mmol/L (ref 134–144)
eGFR: 29 mL/min/{1.73_m2} — ABNORMAL LOW (ref >59)

## 2021-03-04 LAB — URIC ACID: Uric Acid: 7.3 mg/dL — ABNORMAL HIGH (ref 3.0–7.2)

## 2021-03-04 LAB — LIPID PANEL
Chol/HDL Ratio: 2 ratio (ref 0.0–4.4)
Cholesterol, Total: 159 mg/dL (ref 100–199)
HDL: 78 mg/dL (ref >39)
LDL Chol Calc (NIH): 63 mg/dL (ref 0–99)
Triglycerides: 99 mg/dL (ref 0–149)
VLDL Cholesterol Cal: 18 mg/dL (ref 5–40)

## 2021-03-04 LAB — HEPATIC FUNCTION PANEL
ALT: 28 IU/L (ref 0–32)
AST: 19 IU/L (ref 0–40)
Alkaline Phosphatase: 109 IU/L (ref 44–121)
Bilirubin Total: 0.3 mg/dL (ref 0.0–1.2)
Bilirubin, Direct: <0.1 mg/dL (ref 0.00–0.40)
Total Protein: 7.5 g/dL (ref 6.0–8.5)

## 2021-03-04 LAB — CYTOLOGY - PAP
Comment: NEGATIVE
Diagnosis: NEGATIVE
High risk HPV: NEGATIVE

## 2021-03-04 LAB — VITAMIN D 25 HYDROXY (VIT D DEFICIENCY, FRACTURES): Vit D, 25-Hydroxy: 44.6 ng/mL (ref 30.0–100.0)

## 2021-03-04 MED ORDER — VITAMIN D 25 MCG (1000 UNIT) PO TABS: 1000.0000 [IU] | ORAL_TABLET | Freq: Every day | ORAL | 3 refills | Status: DC

## 2021-03-04 MED ORDER — ALLOPURINOL 100 MG PO TABS: 200.0000 mg | ORAL_TABLET | Freq: Every day | ORAL | 3 refills | Status: DC

## 2021-03-19 ENCOUNTER — Encounter: Payer: Self-pay | Admitting: Internal Medicine

## 2021-03-19 ENCOUNTER — Other Ambulatory Visit: Payer: Self-pay

## 2021-03-19 ENCOUNTER — Ambulatory Visit (INDEPENDENT_AMBULATORY_CARE_PROVIDER_SITE_OTHER): Payer: 59 | Admitting: Internal Medicine

## 2021-03-19 VITALS — BP 142/84 | HR 86 | Ht 67.0 in | Wt 216.0 lb

## 2021-03-19 DIAGNOSIS — D3502 Benign neoplasm of left adrenal gland: Secondary | ICD-10-CM

## 2021-03-19 DIAGNOSIS — E1122 Type 2 diabetes mellitus with diabetic chronic kidney disease: Secondary | ICD-10-CM | POA: Diagnosis not present

## 2021-03-19 DIAGNOSIS — E1165 Type 2 diabetes mellitus with hyperglycemia: Secondary | ICD-10-CM

## 2021-03-19 DIAGNOSIS — N1832 Chronic kidney disease, stage 3b: Secondary | ICD-10-CM | POA: Diagnosis not present

## 2021-03-19 LAB — BASIC METABOLIC PANEL
BUN: 52 mg/dL — ABNORMAL HIGH (ref 6–23)
CO2: 23 mEq/L (ref 19–32)
Calcium: 11 mg/dL — ABNORMAL HIGH (ref 8.4–10.5)
Chloride: 103 mEq/L (ref 96–112)
Creatinine, Ser: 2.21 mg/dL — ABNORMAL HIGH (ref 0.40–1.20)
GFR: 22.94 mL/min — ABNORMAL LOW (ref >60.00)
Glucose, Bld: 257 mg/dL — ABNORMAL HIGH (ref 70–99)
Potassium: 5.1 mEq/L (ref 3.5–5.1)
Sodium: 134 mEq/L — ABNORMAL LOW (ref 135–145)

## 2021-03-19 LAB — POCT GLYCOSYLATED HEMOGLOBIN (HGB A1C): Hemoglobin A1C: 8.6 % — AB (ref 4.0–5.6)

## 2021-03-19 MED ORDER — OZEMPIC (1 MG/DOSE) 4 MG/3ML ~~LOC~~ SOPN: 1.0000 mg | PEN_INJECTOR | SUBCUTANEOUS | 11 refills | Status: DC

## 2021-03-19 NOTE — Patient Instructions (Addendum)
-   Continue Glipizide 5 mg, 1 tablet before Breakfast and 2 tablets before supper - Increase  Ozempic to 1 mg once weekly   24-Hour Urine Collection  You will be collecting your urine for a 24-hour period of time. Your timer starts with your first urine of the morning (For example - If you first pee at [place], your timer will start at [place]) [place] away your first urine of the morning Collect your urine every time you pee for the next 24 hours STOP your urine collection 24 hours after you started the collection (For example - You would stop at 9AM the day after you started)    HOW TO TREAT LOW BLOOD SUGARS (Blood sugar LESS THAN 70 MG/DL) Please follow the RULE OF 15 for the treatment of hypoglycemia treatment (when your (blood sugars are less than 70 mg/dL)   STEP 1: Take 15 grams of carbohydrates when your blood sugar is low, which includes:  3-4 GLUCOSE TABS  OR 3-4 OZ OF JUICE OR REGULAR SODA OR ONE TUBE OF GLUCOSE GEL    STEP 2: RECHECK blood sugar in 15 MINUTES STEP 3: If your blood sugar is still low at the 15 minute recheck --> then, go back to STEP 1 and treat AGAIN with another 15 grams of carbohydrates.

## 2021-03-19 NOTE — Progress Notes (Signed)
Name: Kathy [name]  Age/ Sex: 65 y.o., female   MRN/ DOB: [id number], [birth year]     PCP: Caryl Ada   Reason for Endocrinology Evaluation: Type 2 Diabetes Mellitus  Initial Endocrine Consultative Visit: 05/31/2019    PATIENT IDENTIFIER: Kathy [name] is a 65 y.o. female with a past medical history of T2Dm, HTN, A.Fib. The patient has followed with Endocrinology clinic since 05/31/2019 for consultative assistance with management of her diabetes.  DIABETIC HISTORY:  Kathy [name] was diagnosed with T2DM in 2001. Kathy [name], Glipizide (Stopped 02/2019), tradjenta, metfoRMIN . Rybelsus started 04/2019 but did not help so she stopped it and restarted metformin, in 05/2019 metformin was stopped again and was advised not to take it due to low GFR. Her hemoglobin A1c has ranged from 6.7% in 2015, peaking at 10.4% in 2020.  Ozempic started 01/2020  ADRENAL HISTORY: Pt was noted to have an incidental finding of a left adrenal adenoma 1.4 cm on MRI from 05/2018 during evaluation of a renal cysts. A CT scan of adrenal ( no contrast due to CKD and pt declined MRI 03/05/2020) showed an indeterminate left adrenal adenoma at 1 cm.    Labs were normal (01/2020) to include aldo, cortisol and metanephrines.   SUBJECTIVE:   During the last visit (09/18/2020): A1c 7.2% .  continued glipized and Ozempic Glipizide     Today (03/19/2021): Kathy [name] is here for a 3 month follow up on diabetes management.  She checks her blood sugars 2 times daily, preprandial to breakfast and bedtime. The patient has not had hypoglycemic episodes since the last clinic visit.   Denies nausea or vomiting     HOME DIABETES REGIMEN:  Glipizide 5 mg, 1 tablet before Breakfast and 2 tablet before supper Ozempic 0.5 mg weekly      GLUCOSE LOG :  131- 216m/dL    HISTORY:  Past Medical History:  Past Medical History:  Diagnosis Date   [place]rial [place] 05/[place]   Diabetes mellitus without  complication ([facility]    type II   History of Helicobacter pylori infection    Hyperlipidemia    Hypertension    Microalbuminuria 2016   Obesity    Past Surgical History:  Past Surgical History:  Procedure Laterality Date   COLONOSCOPY     age 65yo  Social History:  reports that she has never smoked. She has never used smokeless tobacco. She reports current alcohol use. She reports that she does not use drugs. Family History:  Family History  Problem Relation Age of Onset   Stroke Mother    Cancer Father        throat   Diabetes Sister    Diabetes Sister    Diabetes Sister    Cancer Sister        breast   Breast cancer Sister    Heart disease Neg Hx      HOME MEDICATIONS: Allergies as of 03/19/2021   No Known Allergies      Medication List        Accurate as of March 19, 2021  7:58 AM. If you have any questions, ask your nurse or doctor.          allopurinol 100 MG tablet Commonly known as: ZYLOPRIM Take 2 tablets (200 mg total) by mouth daily.   amLODipine-olmesartan 10-40 MG tablet Commonly known as: AZOR Take 1 tablet by mouth daily.   atenolol 50 MG tablet Commonly known as: TENORMIN Take  1 tablet (50 mg total) by mouth daily.   blood glucose meter kit and supplies Kit Dispense based on patient and insurance preference. Use up to four times daily as directed. (FOR ICD-9 250.00, 250.01).   cholecalciferol 25 MCG (1000 UNIT) tablet Commonly known as: VITAMIN D3 Take 1 tablet (1,000 Units total) by mouth daily.   ferrous sulfate 325 (65 FE) MG EC tablet Take 65 mg by mouth 3 (three) times daily with meals.   glipiZIDE 5 MG tablet Commonly known as: GLUCOTROL Take 1 tablet (5 mg total) by mouth daily before breakfast AND 2 tablets (10 mg total) daily before supper.   glucose blood test strip Contour Next test strips. Use as instructed   hydrALAZINE 10 MG tablet Commonly known as: APRESOLINE TAKE 1 TABLET(10 MG) BY MOUTH TWICE DAILY    linaclotide 145 MCG Caps capsule Commonly known as: Linzess Take 1 capsule (145 mcg total) by mouth daily before breakfast.   multivitamin with minerals Tabs tablet Take 1 tablet by mouth daily.   Ozempic (0.25 or 0.5 MG/DOSE) 2 MG/1.5ML Sopn Generic drug: Semaglutide(0.25 or 0.5MG/DOS) INJECT 0.5MG UNDER THE SKIN ONCE A WEEK   rosuvastatin 20 MG tablet Commonly known as: CRESTOR TAKE 1 TABLET(20 MG) BY MOUTH AT BEDTIME   vitamin C 250 MG tablet Commonly known as: ASCORBIC ACID Take 250 mg by mouth daily.         OBJECTIVE:   Vital Signs:BP (!) 142/84   Pulse 86   Ht _0  (1.702 m)   Wt 216 lb (98 kg)   SpO2 97%   BMI 33.83 kg/m   Wt Readings from Last 3 Encounters:  03/03/21 220 lb 6.4 oz (100 kg)  09/18/20 211 lb 8 oz (95.9 kg)  06/04/20 210 lb (95.3 kg)     Exam: General: Pt appears well and is in NAD  Lungs: Clear with good BS bilat with no rales, rhonchi, or wheezes  Heart: RRR with normal S1 and S2 and no gallops; no murmurs; no rub  Abdomen: Normoactive bowel sounds, soft, nontender, without masses or organomegaly palpable  Extremities: No pretibial edema.  Skin: Normal texture and temperature to palpation. No rash noted. No Acanthosis nigricans/skin tags. No lipohypertrophy.  Neuro: MS is good with appropriate affect, pt is alert and Ox3      DM foot exam: 09/18/2020   The skin of the feet is intact without sores or ulcerations. The pedal pulses are 2+ on right and 2+ on left. The sensation is intact to a screening 5.07, 10 gram monofilament bilaterally    DATA REVIEWED:  Lab Results  Component Value Date   HGBA1C 7.2 (A) 09/18/2020   HGBA1C 8.3 (A) 05/08/2020   HGBA1C 8.3 05/08/2020   HGBA1C 8.3 (A) 05/08/2020   HGBA1C 8.3 (A) 05/08/2020   Lab Results  Component Value Date   MICROALBUR 98.0 05/24/2016   LDLCALC 63 03/03/2021   CREATININE 1.89 (H) 03/03/2021   Lab Results  Component Value Date   MICRALBCREAT 2,891 (H) 02/26/2019      Lab Results  Component Value Date   CHOL 159 03/03/2021   HDL 78 03/03/2021   LDLCALC 63 03/03/2021   TRIG 99 03/03/2021   CHOLHDL 2.0 03/03/2021        Results for ALYRICA, THUROW [name] (MRN [id number]) as of 03/20/2021 12:41  Ref. Range 03/19/2021 11:13  Sodium Latest Ref Range: 135 - 145 mEq/L 134 (L)  Potassium Latest Ref Range: 3.5 - 5.1 mEq/L 5.1  Chloride Latest Ref Range: 96 - 112 mEq/L 103  CO2 Latest Ref Range: 19 - 32 mEq/L 23  Glucose Latest Ref Range: 70 - 99 mg/dL 257 (H)  BUN Latest Ref Range: 6 - 23 mg/dL 52 (H)  Creatinine Latest Ref Range: 0.40 - 1.20 mg/dL 2.21 (H)  Calcium Latest Ref Range: 8.4 - 10.5 mg/dL 11.0 (H)  GFR Latest Ref Range: >60.00 mL/min 22.94 (L)    CT scan 03/05/2020 Adrenals/Urinary Tract: Indeterminate 1 cm left adrenal nodule. This can be better characterized with MRI or dedicated adrenal protocol CT. The right adrenal gland is unremarkable. There is no hydronephrosis or nephrolithiasis on either side. Subcentimeter bilateral renal hypodense and slightly higher attenuating lesions are not characterized on this CT but may represent complex cysts. These can be better evaluated with MRI or ultrasound. The visualized ureters appear unremarkable.  ASSESSMENT / PLAN / RECOMMENDATIONS:   1) Type 2 Diabetes Mellitus, poorly controlled, With CKD III complications - Most recent A1c of 8.6 %. Goal A1c < 7.0 %.    -Patient with hyperglycemia and worsening A1c -Patient admits to dietary indiscretions -We discussed SGLT2 inhibitors especially with cardiovascular renal benefits, will consider this if hyperglycemia persists - She is tolerating Ozempic without side effects, will increase -BMP shows worsening GFR  MEDICATIONS:  - Continue Glipizide 5 mg, 1 tablet before Breakfast and 2 tablets before supper -Increase Ozempic to 1 mg weekly     EDUCATION / INSTRUCTIONS: BG monitoring instructions: Patient is instructed to check her blood sugars  2 times a day, before breakfast and supper . Call [facility] if: BG persistently < 70 I reviewed the Rule of 15 for the treatment of hypoglycemia in detail with the patient. Literature supplied.  2. Left Adrenal Adenoma :   - Stable imaging (02/2020) - No clinical or biochemical evidence of extra hormonal excretion , last year we will proceed with repeat screening for hypercholesterolemia, Cushing syndrome, and pheochromocytoma   3.  Hypercalcemia:  -This is the first time she has a serum calcium that is elevated, there is no concomitant albumin to measure corrected calcium.  We will continue to monitor this and check PTH if this persists.  F/U in 6 months    Signed electronically by: Mack Guise, MD  [facility]  [address] [place], [place] [address] Phone: [phone] FAX: [phone]   CC: Carlena Hurl, PA-C [place] [address] Phone: [phone]  Fax: [phone]  Return to Endocrinology clinic as below: Future Appointments  Date Time Provider [place]  03/19/2021 10:30 AM Camdon Saetern, Melanie Crazier, MD [facility] None  04/14/2021 11:15 AM Tysinger, Camelia Eng, PA-C [facility]

## 2021-03-20 ENCOUNTER — Encounter: Payer: Self-pay | Admitting: Internal Medicine

## 2021-03-23 ENCOUNTER — Other Ambulatory Visit: Payer: 59

## 2021-03-23 ENCOUNTER — Other Ambulatory Visit: Payer: Self-pay

## 2021-03-23 DIAGNOSIS — D3502 Benign neoplasm of left adrenal gland: Secondary | ICD-10-CM

## 2021-03-27 LAB — ALDOSTERONE + RENIN ACTIVITY W/ RATIO
ALDOSTERONE: <1 ng/dL (ref 0.0–30.0)
Renin: 3.129 ng/mL/hr (ref 0.167–5.380)

## 2021-04-03 LAB — CATECHOLAMINE+VMA, 24-HR URINE
Dopamine , 24H Ur: 71 ug/24 hr (ref 0–510)
Dopamine, Rand Ur: 23 ug/L
Epinephrine, 24H Ur: 3 ug/24 hr (ref 0–20)
Epinephrine, Rand Ur: 1 ug/L
Norepinephrine, 24H Ur: 40 ug/24 hr (ref 0–135)
Norepinephrine, Rand Ur: 13 ug/L
VMA, 24H Ur Adult: 1.5 mg/24 hr (ref 0.0–7.5)
VMA, Urine: 0.5 mg/L

## 2021-04-14 ENCOUNTER — Ambulatory Visit (INDEPENDENT_AMBULATORY_CARE_PROVIDER_SITE_OTHER): Payer: 59 | Admitting: Medical

## 2021-04-14 ENCOUNTER — Other Ambulatory Visit: Payer: Self-pay

## 2021-04-14 VITALS — BP 120/76 | HR 59 | Wt 213.6 lb

## 2021-04-14 DIAGNOSIS — E559 Vitamin D deficiency, unspecified: Secondary | ICD-10-CM | POA: Diagnosis not present

## 2021-04-14 DIAGNOSIS — E790 Hyperuricemia without signs of inflammatory arthritis and tophaceous disease: Secondary | ICD-10-CM

## 2021-04-14 DIAGNOSIS — E1122 Type 2 diabetes mellitus with diabetic chronic kidney disease: Secondary | ICD-10-CM

## 2021-04-14 DIAGNOSIS — I10 Essential (primary) hypertension: Secondary | ICD-10-CM

## 2021-04-14 DIAGNOSIS — Z23 Encounter for immunization: Secondary | ICD-10-CM | POA: Diagnosis not present

## 2021-04-14 DIAGNOSIS — Z1231 Encounter for screening mammogram for malignant neoplasm of breast: Secondary | ICD-10-CM

## 2021-04-14 DIAGNOSIS — N1832 Chronic kidney disease, stage 3b: Secondary | ICD-10-CM

## 2021-04-14 DIAGNOSIS — I2584 Coronary atherosclerosis due to calcified coronary lesion: Secondary | ICD-10-CM

## 2021-04-14 DIAGNOSIS — E1169 Type 2 diabetes mellitus with other specified complication: Secondary | ICD-10-CM

## 2021-04-14 DIAGNOSIS — I2510 Atherosclerotic heart disease of native coronary artery without angina pectoris: Secondary | ICD-10-CM

## 2021-04-14 DIAGNOSIS — E785 Hyperlipidemia, unspecified: Secondary | ICD-10-CM

## 2021-04-14 NOTE — Patient Instructions (Signed)
High blood pressure Your numbers look much better today Continue amlodipine olmesartan 10/40 mg daily Continue atenolol 50 mg daily Continue hydralazine 10 mg twice daily  Diabetes Continue follow-up with your diabetes specialist and current medication Your Ozempic was recently increased to 1 mg dose weekly  Chronic kidney disease Expect a phone call about appointment with kidney doctor soon Due to abnormal kidney function, and in order to protect your kidneys, I recommend you avoid medications that can harm the kidneys such as ibuprofen, Aleve, Advil, Motrin, Naprosyn, or prescription anti-inflammatories which are used for pain, inflammation, and arthritis.   You should avoid dehydration which can harm the kidneys. Avoid dehydration.  Drink 80 to 100 ounces of water daily In the future if you have an illness, avoid dehydration or come in for evaluation before you get too dehydrated  Elevated uric acid Continue allopurinol 100 mg tablet, 2 tablets daily.  We increased this last visit  High cholesterol Continue Crestor rosuvastatin 20 mg daily  Vitamin D deficiency Your current dose changes last visit should be 1000 units daily   We updated your shingles vaccine today.  Return in 2 months for Shingrix No. 2   Please call to schedule your bone density test and mammogram.  [facility]  [phone] [address] [place], [place] [address]

## 2021-04-14 NOTE — Progress Notes (Signed)
Subjective:  Kathy [name] is a 65 y.o. female who presents for Chief Complaint  Patient presents with   6 week follow-up    6 week follow-up on BP and would like shingrix shot- pt states covered by insurance     Here for f/u on BP.  Since last visit working hard on exercise, healthier diet and compliant with medication  Last visit several medication changes were made as her blood pressure was not at goal.  She has made those changes and is compliant with her medications  She has seen her diabetes specialist with last visit here.  Ozempic was increased  She has not made her appointment with bone density or mammogram yet  She called insurance and they do cover shingles vaccine.  She wants to start this today  No other aggravating or relieving factors.    No other c/o.  Past Medical History:  Diagnosis Date   Atrial fibrillation ([place]) 05/2016   Diabetes mellitus without complication ([place])    type II   History of Helicobacter pylori infection    Hyperlipidemia    Hypertension    Microalbuminuria 2016   Obesity    Current Outpatient Medications on File Prior to Visit  Medication Sig Dispense Refill   allopurinol (ZYLOPRIM) 100 MG tablet Take 2 tablets (200 mg total) by mouth daily. 180 tablet 3   amLODipine-olmesartan (AZOR) 10-40 MG tablet Take 1 tablet by mouth daily. 90 tablet 3   atenolol (TENORMIN) 50 MG tablet Take 1 tablet (50 mg total) by mouth daily. 90 tablet 0   blood glucose meter kit and supplies KIT Dispense based on patient and insurance preference. Use up to four times daily as directed. (FOR ICD-9 250.00, 250.01). 1 each 0   cholecalciferol (VITAMIN D3) 25 MCG (1000 UNIT) tablet Take 1 tablet (1,000 Units total) by mouth daily. 90 tablet 3   ferrous sulfate 325 (65 FE) MG EC tablet Take 65 mg by mouth 3 (three) times daily with meals.     glipiZIDE (GLUCOTROL) 5 MG tablet Take 1 tablet (5 mg total) by mouth daily before breakfast AND 2 tablets (10 mg total)  daily before supper. 270 tablet 3   glucose blood test strip Contour Next test strips. Use as instructed 100 each 6   hydrALAZINE (APRESOLINE) 10 MG tablet TAKE 1 TABLET(10 MG) BY MOUTH TWICE DAILY 60 tablet 8   Multiple Vitamin (MULTIVITAMIN WITH MINERALS) TABS tablet Take 1 tablet by mouth daily.     rosuvastatin (CRESTOR) 20 MG tablet TAKE 1 TABLET(20 MG) BY MOUTH AT BEDTIME 90 tablet 3   Semaglutide, 1 MG/DOSE, (OZEMPIC, 1 MG/DOSE,) 4 MG/3ML SOPN Inject 1 mg into the skin once a week. 3 mL 11   vitamin C (ASCORBIC ACID) 250 MG tablet Take 250 mg by mouth daily.     No current facility-administered medications on file prior to visit.     The following portions of the patient's history were reviewed and updated as appropriate: allergies, current medications, past family history, past medical history, past social history, past surgical history and problem list.  ROS Otherwise as in subjective above     Objective: BP 120/76   Pulse (!) 59   Wt 213 lb 9.6 oz (96.9 kg)   SpO2 99%   BMI 33.45 kg/m   BP Readings from Last 3 Encounters:  04/14/21 120/76  03/19/21 (!) 142/84  03/03/21 (!) 170/90   Wt Readings from Last 3 Encounters:  04/14/21 213 lb 9.6  oz (96.9 kg)  03/19/21 216 lb (98 kg)  03/03/21 220 lb 6.4 oz (100 kg)    General appearance: alert, no distress, well developed, well nourished Neck: supple, no lymphadenopathy, no thyromegaly, no masses Heart: RRR, normal S1, S2, no murmurs Lungs: CTA bilaterally, no wheezes, rhonchi, or rales Abdomen: +bs, soft, non tender, non distended, no masses, no hepatomegaly, no splenomegaly, no bruits Pulses: 2+ radial pulses, 2+ pedal pulses, normal cap refill Ext: no edema   Assessment: Encounter Diagnoses  Name Primary?   Stage 3b chronic kidney disease ([place]) Yes   Vitamin D deficiency    Type 2 diabetes mellitus with stage 3b chronic kidney disease, without long-term current use of insulin (H[facility])    Hyperlipidemia  associated with type 2 diabetes mellitus ([place])    Essential hypertension, benign    Elevated blood uric acid level    Coronary artery calcification    Need for shingles vaccine    Encounter for screening mammogram for malignant neoplasm of breast      Plan: Fortunately her blood pressure looks the best it has looked in a long time.  We made several changes last visit to blood pressure regimen, we reduced her vitamin D dose, we increased her allopurinol.  She notes compliance of medications  I reviewed her recent endocrinology notes from last month   High blood pressure Your numbers look much better today Continue amlodipine olmesartan 10/40 mg daily Continue atenolol 50 mg daily Continue hydralazine 10 mg twice daily  Diabetes Continue follow-up with your diabetes specialist and current medication Your Ozempic was recently increased to 1 mg dose weekly  Chronic kidney disease Expect a phone call about appointment with kidney doctor soon Due to abnormal kidney function, and in order to protect your kidneys, I recommend you avoid medications that can harm the kidneys such as ibuprofen, Aleve, Advil, Motrin, Naprosyn, or prescription anti-inflammatories which are used for pain, inflammation, and arthritis.   You should avoid dehydration which can harm the kidneys. Avoid dehydration.  Drink 80 to 100 ounces of water daily In the future if you have an illness, avoid dehydration or come in for evaluation before you get too dehydrated  Elevated uric acid Continue allopurinol 100 mg tablet, 2 tablets daily.  We increased this last visit  High cholesterol Continue Crestor rosuvastatin 20 mg daily  Vitamin D deficiency Your current dose changes last visit should be 1000 units daily   We updated your shingles vaccine today.  Return in 2 months for Shingrix No. 2   Please call to schedule your bone density test and mammogram.  [facility]   [phone] [address], [place], [place] [address]   Counseled on the Shingrix vaccine.  Vaccine information sheet given. Shingrix #1 vaccine given after consent obtained.   Return in 2 months for Shingrix #2.   Otisha was seen today for 6 week follow-up.  Diagnoses and all orders for this visit:  Stage 3b chronic kidney disease ([place]) -     Ambulatory referral to Nephrology  Vitamin D deficiency  Type 2 diabetes mellitus with stage 3b chronic kidney disease, without long-term current use of insulin ([place])  Hyperlipidemia associated with type 2 diabetes mellitus ([place])  Essential hypertension, benign  Elevated blood uric acid level  Coronary artery calcification  Need for shingles vaccine -     Varicella-zoster vaccine IM  Encounter for screening mammogram for malignant neoplasm of breast   Follow up: with  nephrology

## 2021-04-24 ENCOUNTER — Encounter: Payer: Self-pay | Admitting: Internal Medicine

## 2021-05-19 ENCOUNTER — Other Ambulatory Visit: Payer: Self-pay | Admitting: Internal Medicine

## 2021-05-27 ENCOUNTER — Other Ambulatory Visit: Payer: Self-pay | Admitting: Medical

## 2021-06-06 ENCOUNTER — Emergency Department (HOSPITAL_COMMUNITY)
Admission: EM | Admit: 2021-06-06 | Discharge: 2021-06-06 | Disposition: A | Discharge status: Home / Self Care | Payer: 59 | Attending: Emergency Medicine | Admitting: Emergency Medicine

## 2021-06-06 ENCOUNTER — Other Ambulatory Visit: Payer: Self-pay

## 2021-06-06 ENCOUNTER — Encounter (HOSPITAL_COMMUNITY): Payer: Self-pay

## 2021-06-06 DIAGNOSIS — R309 Painful micturition, unspecified: Secondary | ICD-10-CM | POA: Diagnosis present

## 2021-06-06 DIAGNOSIS — N1832 Chronic kidney disease, stage 3b: Secondary | ICD-10-CM | POA: Diagnosis not present

## 2021-06-06 DIAGNOSIS — Z7984 Long term (current) use of oral hypoglycemic drugs: Secondary | ICD-10-CM | POA: Diagnosis not present

## 2021-06-06 DIAGNOSIS — Z79899 Other long term (current) drug therapy: Secondary | ICD-10-CM | POA: Insufficient documentation

## 2021-06-06 DIAGNOSIS — Z794 Long term (current) use of insulin: Secondary | ICD-10-CM | POA: Insufficient documentation

## 2021-06-06 DIAGNOSIS — I129 Hypertensive chronic kidney disease with stage 1 through stage 4 chronic kidney disease, or unspecified chronic kidney disease: Secondary | ICD-10-CM | POA: Diagnosis not present

## 2021-06-06 DIAGNOSIS — E860 Dehydration: Secondary | ICD-10-CM | POA: Insufficient documentation

## 2021-06-06 DIAGNOSIS — E1122 Type 2 diabetes mellitus with diabetic chronic kidney disease: Secondary | ICD-10-CM | POA: Insufficient documentation

## 2021-06-06 LAB — URINALYSIS, ROUTINE W REFLEX MICROSCOPIC
Bilirubin Urine: NEGATIVE
Glucose, UA: NEGATIVE mg/dL
Hgb urine dipstick: NEGATIVE
Ketones, ur: NEGATIVE mg/dL
Leukocytes,Ua: NEGATIVE
Nitrite: NEGATIVE
Protein, ur: 100 mg/dL — AB
Specific Gravity, Urine: 1.003 — ABNORMAL LOW (ref 1.005–1.030)
pH: 6 (ref 5.0–8.0)

## 2021-06-06 LAB — CBC WITH DIFFERENTIAL/PLATELET
Abs Immature Granulocytes: 0.02 10*3/uL (ref 0.00–0.07)
Basophils Absolute: 0 10*3/uL (ref 0.0–0.1)
Basophils Relative: 1 %
Eosinophils Absolute: 0.2 10*3/uL (ref 0.0–0.5)
Eosinophils Relative: 3 %
HCT: 36.7 % (ref 36.0–46.0)
Hemoglobin: 12 g/dL (ref 12.0–15.0)
Immature Granulocytes: 0 %
Lymphocytes Relative: 32 %
Lymphs Abs: 2.5 10*3/uL (ref 0.7–4.0)
MCH: 27.5 pg (ref 26.0–34.0)
MCHC: 32.7 g/dL (ref 30.0–36.0)
MCV: 84.2 fL (ref 80.0–100.0)
Monocytes Absolute: 0.6 10*3/uL (ref 0.1–1.0)
Monocytes Relative: 7 %
Neutro Abs: 4.6 10*3/uL (ref 1.7–7.7)
Neutrophils Relative %: 57 %
Platelets: 274 10*3/uL (ref 150–400)
RBC: 4.36 MIL/uL (ref 3.87–5.11)
RDW: 14.2 % (ref 11.5–15.5)
WBC: 8 10*3/uL (ref 4.0–10.5)
nRBC: 0 % (ref 0.0–0.2)

## 2021-06-06 LAB — BASIC METABOLIC PANEL
Anion gap: 9 (ref 5–15)
BUN: 43 mg/dL — ABNORMAL HIGH (ref 8–23)
CO2: 23 mmol/L (ref 22–32)
Calcium: 9.4 mg/dL (ref 8.9–10.3)
Chloride: 96 mmol/L — ABNORMAL LOW (ref 98–111)
Creatinine, Ser: 2.79 mg/dL — ABNORMAL HIGH (ref 0.44–1.00)
GFR, Estimated: 18 mL/min — ABNORMAL LOW (ref >60)
Glucose, Bld: 198 mg/dL — ABNORMAL HIGH (ref 70–99)
Potassium: 4.7 mmol/L (ref 3.5–5.1)
Sodium: 128 mmol/L — ABNORMAL LOW (ref 135–145)

## 2021-06-06 MED ORDER — SODIUM CHLORIDE 0.9 % IV BOLUS
500.0000 mL | Freq: Once | INTRAVENOUS | Status: AC
  Administered 2021-06-06: 500 mL via INTRAVENOUS

## 2021-06-06 MED ORDER — SODIUM CHLORIDE 0.9 % IV BOLUS
1000.0000 mL | Freq: Once | INTRAVENOUS | Status: AC
  Administered 2021-06-06: 1000 mL via INTRAVENOUS

## 2021-06-06 NOTE — ED Provider Notes (Signed)
Kathy [name]   CSN: [id number] Arrival date & time: 06/06/21  2040     History Chief Complaint  Patient presents with   Dysuria    Decreased urine output    Kathy [name] is a 65 y.o. female.  Patient complains of decreased urination and also burning going to urination.  Patient has history of renal disease.  No fever no chills  The history is provided by the patient and medical records. No language interpreter was used.  Dysuria Pain quality:  Unable to specify Pain severity:  Moderate Onset quality:  Sudden Timing:  Constant Progression:  Waxing and waning Chronicity:  New Recent urinary tract infections: no   Relieved by:  Nothing Worsened by:  Nothing Ineffective treatments:  None tried Associated symptoms: no abdominal pain       Past Medical History:  Diagnosis Date   Atrial fibrillation ([place]) 05/2016   Diabetes mellitus without complication ([place])    type II   History of Helicobacter pylori infection    Hyperlipidemia    Hypertension    Microalbuminuria 2016   Obesity     Patient Active Problem List   Diagnosis Date Noted   Encounter for screening mammogram for malignant neoplasm of breast 04/14/2021   Need for shingles vaccine 04/14/2021   Wears dentures 03/03/2021   Vitamin D deficiency 03/03/2021   Hyperlipidemia associated with type 2 diabetes mellitus ([place]) 03/03/2021   Coronary artery calcification 06/04/2020   Adrenal adenoma, left 02/01/2020   Type 2 diabetes mellitus with hyperglycemia, without long-term current use of insulin ([place]) 02/01/2020   Type 2 diabetes mellitus with stage 3b chronic kidney disease, without long-term current use of insulin ([place]) 07/26/2019   Left adrenal mass ([place]) 07/06/2019   Elevated blood uric acid level 02/26/2019   Gastroesophageal reflux disease 02/26/2019   Pain of foot 10/31/2018   Chronic kidney disease (CKD), stage III (moderate) ([place]) 06/20/2018   Lesion of  adrenal gland ([place]) 06/20/2018   Renal cyst 06/20/2018   Uterine leiomyoma 06/20/2018   Callus of foot 12/19/2017   Constipation 11/18/2016   Insomnia 11/18/2016   Gum disease 11/18/2016   PAF (paroxysmal atrial fibrillation) ([place]) 07/19/2016   Encounter for health maintenance examination in adult 07/19/2016   Screen for colon cancer 07/19/2016   Screening for cervical cancer 07/19/2016   Post-menopausal 07/19/2016   Estrogen deficiency 07/19/2016   Vaccine counseling 07/19/2016   Need for influenza vaccination 05/24/2016   Diabetes mellitus with coincident hypertension ([place]) 05/24/2016   Type 2 diabetes mellitus with complication, without long-term current use of insulin ([place]) 08/06/2013   Essential hypertension, benign 08/06/2013    Past Surgical History:  Procedure Laterality Date   COLONOSCOPY     age 76yo     OB History   No obstetric history on file.     Family History  Problem Relation Age of Onset   Stroke Mother    Cancer Father        throat   Diabetes Sister    Diabetes Sister    Diabetes Sister    Cancer Sister        breast   Breast cancer Sister    Heart disease Neg Hx     Social History   Tobacco Use   Smoking status: Never   Smokeless tobacco: Never  Substance Use Topics   Alcohol use: Yes    Comment: rarely   Drug use: No    Home Medications Prior  to Admission medications   Medication Sig Start Date End Date Taking? Authorizing Provider  allopurinol (ZYLOPRIM) 100 MG tablet Take 2 tablets (200 mg total) by mouth daily. 03/04/21   Tysinger, Camelia Eng, PA-C  amLODipine-olmesartan (AZOR) 10-40 MG tablet Take 1 tablet by mouth daily. 03/03/21   Tysinger, Camelia Eng, PA-C  atenolol (TENORMIN) 50 MG tablet TAKE 1 TABLET(50 MG) BY MOUTH DAILY 05/27/21   Tysinger, Camelia Eng, PA-C  blood glucose meter kit and supplies KIT Dispense based on patient and insurance preference. Use up to four times daily as directed. (FOR ICD-9 250.00, 250.01). 03/14/18    Tysinger, Camelia Eng, PA-C  cholecalciferol (VITAMIN D3) 25 MCG (1000 UNIT) tablet Take 1 tablet (1,000 Units total) by mouth daily. 03/04/21   Tysinger, Camelia Eng, PA-C  ferrous sulfate 325 (65 FE) MG EC tablet Take 65 mg by mouth 3 (three) times daily with meals.    [provider]  glipiZIDE (GLUCOTROL) 5 MG tablet Take 1 tablet (5 mg total) by mouth daily before breakfast AND 2 tablets (10 mg total) daily before supper. 07/07/20   Shamleffer, Melanie Crazier, MD  glucose blood test strip Contour Next test strips. Use as instructed 05/10/19   Tysinger, Camelia Eng, PA-C  hydrALAZINE (APRESOLINE) 10 MG tablet Take 1 tablet (10 mg total) by mouth 2 (two) times daily. Please make overdue appt with Dr Gasper Sells before anymore refills. Thank you 1st attempt 05/20/21   Werner Lean, MD  Multiple Vitamin (MULTIVITAMIN WITH MINERALS) TABS tablet Take 1 tablet by mouth daily.    [provider]  rosuvastatin (CRESTOR) 20 MG tablet TAKE 1 TABLET(20 MG) BY MOUTH AT BEDTIME 03/03/21   Tysinger, Camelia Eng, PA-C  Semaglutide, 1 MG/DOSE, (OZEMPIC, 1 MG/DOSE,) 4 MG/3ML SOPN Inject 1 mg into the skin once a week. 03/19/21   Shamleffer, Melanie Crazier, MD  vitamin C (ASCORBIC ACID) 250 MG tablet Take 250 mg by mouth daily.    [provider]    Allergies    Patient has no known allergies.  Review of Systems   Review of Systems  Constitutional:  Negative for appetite change and fatigue.  HENT:  Negative for congestion, ear discharge and sinus pressure.   Eyes:  Negative for discharge.  Respiratory:  Negative for cough.   Cardiovascular:  Negative for chest pain.  Gastrointestinal:  Negative for abdominal pain and diarrhea.  Genitourinary:  Positive for dysuria. Negative for frequency and hematuria.  Musculoskeletal:  Negative for back pain.  Skin:  Negative for rash.  Neurological:  Negative for seizures and headaches.  Psychiatric/Behavioral:  Negative for hallucinations.     Physical Exam Updated Vital Signs BP (!) 165/75 (BP Location: Right Arm)   Pulse 74   Temp 98.2 F (36.8 C)   Resp 20   Ht 5' 7"  (1.702 m)   Wt 95.3 kg   SpO2 100%   BMI 32.89 kg/m   Physical Exam Vitals and nursing [name] reviewed.  Constitutional:      Appearance: She is well-developed.  HENT:     Head: Normocephalic.     Nose: Nose normal.  Eyes:     General: No scleral icterus.    Conjunctiva/sclera: Conjunctivae normal.  Neck:     Thyroid: No thyromegaly.  Cardiovascular:     Rate and Rhythm: Normal rate and regular rhythm.     Heart sounds: No murmur heard.   No friction rub. No gallop.  Pulmonary:     Breath sounds: No stridor.  No wheezing or rales.  Chest:     Chest wall: No tenderness.  Abdominal:     General: There is no distension.     Tenderness: There is no abdominal tenderness. There is no rebound.  Musculoskeletal:        General: Normal range of motion.     Cervical back: Neck supple.  Lymphadenopathy:     Cervical: No cervical adenopathy.  Skin:    Findings: No erythema or rash.  Neurological:     Mental Status: She is alert and oriented to person, place, and time.     Motor: No abnormal muscle tone.     Coordination: Coordination normal.  Psychiatric:        Behavior: Behavior normal.    ED Results / Procedures / Treatments   Labs (all labs ordered are listed, but only abnormal results are displayed) Labs Reviewed  URINALYSIS, ROUTINE W REFLEX MICROSCOPIC - Abnormal; Notable for the following components:      Result Value   Color, Urine STRAW (*)    Specific Gravity, Urine 1.003 (*)    Protein, ur 100 (*)    Bacteria, UA RARE (*)    All other components within normal limits  BASIC METABOLIC PANEL - Abnormal; Notable for the following components:   Sodium 128 (*)    Chloride 96 (*)    Glucose, Bld 198 (*)    BUN 43 (*)    Creatinine, Ser 2.79 (*)    GFR, Estimated 18 (*)    All other components within normal limits  URINE  CULTURE  CBC WITH DIFFERENTIAL/PLATELET    EKG None  Radiology No results found.  Procedures Procedures   Medications Ordered in ED Medications  sodium chloride 0.9 % bolus 500 mL (0 mLs Intravenous Stopped 06/06/21 2237)  sodium chloride 0.9 % bolus 1,000 mL (1,000 mLs Intravenous New Bag/Given 06/06/21 2244)    ED Course  I have reviewed the triage vital signs and the nursing notes.  Pertinent labs & imaging results that were available during my care of the patient were reviewed by me and considered in my medical decision making (see chart for details).    MDM Rules/Calculators/A&P                           Labs show dehydration mild worsening renal disease.  Urinalysis was negative.  She will get a urine culture done and she has been hydrated with 1.5 L of saline.  She will follow-up with her PCP for recheck her creatinine and to check on the culture results from the urine Final Clinical Impression(s) / ED Diagnoses Final diagnoses:  Dehydration    Rx / DC Orders ED Discharge Orders     None        Milton Ferguson, MD 06/08/21 1045

## 2021-06-06 NOTE — Discharge Instructions (Addendum)
Drink plenty of fluids and follow-up with your family doctor this week for recheck 

## 2021-06-06 NOTE — ED Triage Notes (Signed)
Pt presents to Ed from home with c/o dysuria and decreased urine output x 3 days.

## 2021-06-08 ENCOUNTER — Telehealth: Payer: Self-pay | Admitting: Medical

## 2021-06-08 LAB — URINE CULTURE

## 2021-06-08 NOTE — Telephone Encounter (Signed)
Pt called back and states she is feeling much better. Pt was offered an appt and she declined stating  she already has an appt first of October and she will just see [place] then. Pt was advise to call if she needed anything between now and then.

## 2021-06-08 NOTE — Telephone Encounter (Signed)
Left message concerning pt's recent er visit. Just checking to see how pt is and informing a follow up with [place] recommended.

## 2021-06-17 ENCOUNTER — Other Ambulatory Visit (INDEPENDENT_AMBULATORY_CARE_PROVIDER_SITE_OTHER): Payer: 59

## 2021-06-17 ENCOUNTER — Other Ambulatory Visit: Payer: Self-pay

## 2021-06-17 DIAGNOSIS — Z23 Encounter for immunization: Secondary | ICD-10-CM | POA: Diagnosis not present

## 2021-06-23 ENCOUNTER — Other Ambulatory Visit: Payer: Self-pay | Admitting: Internal Medicine

## 2021-06-29 ENCOUNTER — Other Ambulatory Visit: Payer: Self-pay | Admitting: Internal Medicine

## 2021-07-08 ENCOUNTER — Other Ambulatory Visit (HOSPITAL_COMMUNITY): Payer: Self-pay | Admitting: Nephrology

## 2021-07-08 ENCOUNTER — Other Ambulatory Visit: Payer: Self-pay | Admitting: Nephrology

## 2021-07-08 DIAGNOSIS — N183 Chronic kidney disease, stage 3 unspecified: Secondary | ICD-10-CM

## 2021-07-14 ENCOUNTER — Other Ambulatory Visit: Payer: Self-pay

## 2021-07-14 ENCOUNTER — Ambulatory Visit (HOSPITAL_COMMUNITY)
Admission: RE | Admit: 2021-07-14 | Discharge: 2021-07-14 | Disposition: A | Discharge status: Home / Self Care | Payer: Medicare Other | Source: Ambulatory Visit | Attending: Nephrology | Admitting: Nephrology

## 2021-07-14 DIAGNOSIS — N183 Chronic kidney disease, stage 3 unspecified: Secondary | ICD-10-CM | POA: Insufficient documentation

## 2021-07-23 ENCOUNTER — Ambulatory Visit (INDEPENDENT_AMBULATORY_CARE_PROVIDER_SITE_OTHER): Payer: Medicare Other | Admitting: Internal Medicine

## 2021-07-23 ENCOUNTER — Other Ambulatory Visit: Payer: Self-pay

## 2021-07-23 VITALS — BP 144/70 | HR 55 | Ht 67.0 in | Wt 210.6 lb

## 2021-07-23 DIAGNOSIS — D3502 Benign neoplasm of left adrenal gland: Secondary | ICD-10-CM

## 2021-07-23 DIAGNOSIS — E1122 Type 2 diabetes mellitus with diabetic chronic kidney disease: Secondary | ICD-10-CM

## 2021-07-23 DIAGNOSIS — N1832 Chronic kidney disease, stage 3b: Secondary | ICD-10-CM | POA: Diagnosis not present

## 2021-07-23 LAB — POCT GLYCOSYLATED HEMOGLOBIN (HGB A1C): Hemoglobin A1C: 6.9 % — AB (ref 4.0–5.6)

## 2021-07-23 NOTE — Patient Instructions (Addendum)
-   Keep Up the Good Work ! - Continue Glipizide 5 mg, 1 tablet before Breakfast and 2 tablets before supper     HOW TO TREAT LOW BLOOD SUGARS (Blood sugar LESS THAN 70 MG/DL) Please follow the RULE OF 15 for the treatment of hypoglycemia treatment (when your (blood sugars are less than 70 mg/dL)   STEP 1: Take 15 grams of carbohydrates when your blood sugar is low, which includes:  3-4 GLUCOSE TABS  OR 3-4 OZ OF JUICE OR REGULAR SODA OR ONE TUBE OF GLUCOSE GEL    STEP 2: RECHECK blood sugar in 15 MINUTES STEP 3: If your blood sugar is still low at the 15 minute recheck --> then, go back to STEP 1 and treat AGAIN with another 15 grams of carbohydrates.

## 2021-07-23 NOTE — Progress Notes (Signed)
Name: Kathy [name]  Age/ Sex: 65 y.o., female   MRN/ DOB: [id number], 1956-03-01     PCP: Caryl Ada   Reason for Endocrinology Evaluation: Type 2 Diabetes Mellitus  Initial Endocrine Consultative Visit: 05/31/2019    PATIENT IDENTIFIER: Kathy [name] is a 65 y.o. female with a past medical history of T2Dm, HTN, A.Fib. The patient has followed with Endocrinology clinic since 05/31/2019 for consultative assistance with management of her diabetes.  DIABETIC HISTORY:  Kathy [name] was diagnosed with T2DM in 2001. [place] Glade, Glipizide (Stopped 02/2019), tradjenta, metfoRMIN . Rybelsus started 04/2019 but did not help so she stopped it and restarted metformin, in 05/2019 metformin was stopped again and was advised not to take it due to low GFR. Her hemoglobin A1c has ranged from 6.7% in 2015, peaking at 10.4% in 2020.  Ozempic started 01/2020  ADRENAL HISTORY: Pt was noted to have an incidental finding of a left adrenal adenoma 1.4 cm on MRI from 05/2018 during evaluation of a renal cysts. A CT scan of adrenal ( no contrast due to CKD and pt declined MRI 03/05/2020) showed an indeterminate left adrenal adenoma at 1 cm.    Labs were normal to include aldo, cortisol and metanephrines.   SUBJECTIVE:   During the last visit (03/19/2021): A1c 8.6% .  continued glipized and increased Ozempic     Today (07/23/2021): [name] is here for a follow up on diabetes management.  She checks her blood sugars 2 times daily, preprandial to breakfast and bedtime. The patient has not had hypoglycemic episodes since the last clinic visit.      She presented to the ED 05/2021 with decreased urine output and was diagnosed with dehydration, she stopped Ozempic attributing dehydration to it   She has been noted with weight loss  She had was evaluated by nephrology in [place]-records not available  HOME DIABETES REGIMEN:  Glipizide 5 mg, 1 tablet before Breakfast and 2 tablet before  supper Ozempic 1 mg weekly  - not taking     GLUCOSE LOG :  67- 161  mg/dL    DIABETIC COMPLICATIONS: Microvascular complications:  CKDIII Denies: retinopathy , neuropathy  Last eye exam: Completed 2018   Macrovascular complications:    Denies: CAD, PVD, CVA       HISTORY:  Past Medical History:  Past Medical History:  Diagnosis Date   Atrial fibrillation ([place]) 05/2016   Diabetes mellitus without complication ([place])    type II   History of Helicobacter pylori infection    Hyperlipidemia    Hypertension    Microalbuminuria 2016   Obesity    Past Surgical History:  Past Surgical History:  Procedure Laterality Date   COLONOSCOPY     age 15yo   Social History:  reports that she has never smoked. She has never used smokeless tobacco. She reports current alcohol use. She reports that she does not use drugs. Family History:  Family History  Problem Relation Age of Onset   Stroke Mother    Cancer Father        throat   Diabetes Sister    Diabetes Sister    Diabetes Sister    Cancer Sister        breast   Breast cancer Sister    Heart disease Neg Hx      HOME MEDICATIONS: Allergies as of 07/23/2021   No Known Allergies      Medication List  Accurate as of July 23, 2021 10:32 AM. If you have any questions, ask your nurse or doctor.          allopurinol 100 MG tablet Commonly known as: ZYLOPRIM Take 2 tablets (200 mg total) by mouth daily.   amLODipine-olmesartan 10-40 MG tablet Commonly known as: AZOR Take 1 tablet by mouth daily.   atenolol 50 MG tablet Commonly known as: TENORMIN TAKE 1 TABLET(50 MG) BY MOUTH DAILY   blood glucose meter kit and supplies Kit Dispense based on patient and insurance preference. Use up to four times daily as directed. (FOR ICD-9 250.00, 250.01).   cholecalciferol 25 MCG (1000 UNIT) tablet Commonly known as: VITAMIN D3 Take 1 tablet (1,000 Units total) by mouth daily.   ferrous sulfate 325 (65  FE) MG EC tablet Take 65 mg by mouth 3 (three) times daily with meals.   glipiZIDE 5 MG tablet Commonly known as: GLUCOTROL TAKE 1 TABLET BY MOUTH DAILY BEFORE BREAKFAST AND 2 TABLETS DAILY BEFORE SUPPER   glucose blood test strip Contour Next test strips. Use as instructed   hydrALAZINE 10 MG tablet Commonly known as: APRESOLINE TAKE 1 TABLET BY MOUTH TWICE DAILY   multivitamin with minerals Tabs tablet Take 1 tablet by mouth daily.   Ozempic (1 MG/DOSE) 4 MG/3ML Sopn Generic drug: Semaglutide (1 MG/DOSE) Inject 1 mg into the skin once a week.   rosuvastatin 20 MG tablet Commonly known as: CRESTOR TAKE 1 TABLET(20 MG) BY MOUTH AT BEDTIME   vitamin C 250 MG tablet Commonly known as: ASCORBIC ACID Take 250 mg by mouth daily.         OBJECTIVE:   Vital Signs:BP (!) 144/70 (BP Location: Left Arm, Patient Position: Sitting, Cuff Size: Normal)   Pulse (!) 55   Ht _0  (1.702 m)   Wt 210 lb 9.6 oz (95.5 kg)   SpO2 99%   BMI 32.98 kg/m   Wt Readings from Last 3 Encounters:  07/23/21 210 lb 9.6 oz (95.5 kg)  06/06/21 210 lb (95.3 kg)  04/14/21 213 lb 9.6 oz (96.9 kg)     Exam: General: Pt appears well and is in NAD  Lungs: Clear with good BS bilat with no rales, rhonchi, or wheezes  Heart: RRR with normal S1 and S2 and no gallops; no murmurs; no rub  Abdomen: Normoactive bowel sounds, soft, nontender, without masses or organomegaly palpable  Extremities: No pretibial edema.  Skin: Normal texture and temperature to palpation. No rash noted. No Acanthosis nigricans/skin tags. No lipohypertrophy.  Neuro: MS is good with appropriate affect, pt is alert and Ox3      DM foot exam: 09/18/2020   The skin of the feet is intact without sores or ulcerations. The pedal pulses are 2+ on right and 2+ on left. The sensation is intact to a screening 5.07, 10 gram monofilament bilaterally    DATA REVIEWED:  Lab Results  Component Value Date   HGBA1C 6.9 (A) 07/23/2021    HGBA1C 8.6 (A) 03/19/2021   HGBA1C 7.2 (A) 09/18/2020   Lab Results  Component Value Date   MICROALBUR 98.0 05/24/2016   LDLCALC 63 03/03/2021   CREATININE 2.79 (H) 06/06/2021   Lab Results  Component Value Date   MICRALBCREAT 2,891 (H) 02/26/2019     Lab Results  Component Value Date   CHOL 159 03/03/2021   HDL 78 03/03/2021   LDLCALC 63 03/03/2021   TRIG 99 03/03/2021   CHOLHDL 2.0 03/03/2021  Results for JENINE, KRISHER (MRN [id number]) as of 07/23/2021 07:37  Ref. Range 03/19/2021 11:13  ALDOSTERONE Latest Ref Range: 0.0 - 30.0 ng/dL <1.0  Renin Latest Ref Range: 0.167 - 5.380 ng/mL/hr 3.129  ALDOS/RENIN RATIO Latest Ref Range: 0.0 - 30.0  <.3   Results for JAZILYN, SIEGENTHALER (MRN [id number]) as of 07/23/2021 07:37  Ref. Range 02/04/2020 09:52  Cortisol (Ur), Free Latest Ref Range: 4.0 - 50.0 mcg/24 h 6.7   Results for CARMEN, TOLLIVER (MRN [id number]) as of 07/23/2021 10:26  Ref. Range 03/23/2021 11:13  Dopamine 24 Hr Urine Latest Ref Range: 0 - 510 ug/24 hr 71  Dopamine, Rand Ur Latest Ref Range: Undefined ug/L 23  Epinephrine 24 Hr Urine Latest Ref Range: 0 - 20 ug/24 hr 3  Epinephrine, Rand Ur Latest Ref Range: Undefined ug/L 1  Norepinephrine, Rand Ur Latest Ref Range: Undefined ug/L 13  Norepinephrine, 24H Ur Latest Ref Range: 0 - 135 ug/24 hr 40  VMA, Urine Latest Ref Range: Undefined mg/L 0.5  VMA, 24H Ur Adult Latest Ref Range: 0.0 - 7.5 mg/24 hr 1.5    CT scan 03/05/2020 Adrenals/Urinary Tract: Indeterminate 1 cm left adrenal nodule. This can be better characterized with MRI or dedicated adrenal protocol CT. The right adrenal gland is unremarkable. There is no hydronephrosis or nephrolithiasis on either side. Subcentimeter bilateral renal hypodense and slightly higher attenuating lesions are not characterized on this CT but may represent complex cysts. These can be better evaluated with MRI or ultrasound. The visualized ureters appear  unremarkable.  ASSESSMENT / PLAN / RECOMMENDATIONS:   1) Type 2 Diabetes Mellitus, Optimally controlled, With CKD III complications - Most recent A1c of 6.9 %. Goal A1c < 7.0 %.    -Her A1c is at goal and she has lost weight since her last visit here. I am not ure how much of that is attributed to prior use of Ozempic, she has been out for almost 6 weeks - Pt under the impression Ozempic caused dehydration, she is also under the impression that Ozempic has also caused her kidney function to deteriorate.  I did explain to the patient that Ozempic does not cause dehydration without nausea and vomiting, which the patient did not have.  I also explained to her that Ozempic is not known to cause low GFR but on the contrary diabetes would -At this time she is not comfortable with taking GLP-1 agonist -I am not going to offer her any SGLT2 inhibitors as they have high risk for dehydration  -Encouraged continued glucose monitoring and low carbohydrate diet   MEDICATIONS:  - Continue Glipizide 5 mg, 1 tablet before Breakfast and 2 tablets before supper      EDUCATION / INSTRUCTIONS: BG monitoring instructions: Patient is instructed to check her blood sugars 2 times a day, before breakfast and supper . Call [place] [facility] if: BG persistently < 70 I reviewed the Rule of 15 for the treatment of hypoglycemia in detail with the patient. Literature supplied.  2. Left Adrenal Adenoma :   - Stable imaging (02/2020) - No clinical or biochemical evidence of extra hormonal excretion      F/U in 4 months    Signed electronically by: Mack Guise, MD  [facility]  [facility] [address] [place], [place] [address] Phone: [phone] FAX: [phone]   CC: Carlena Hurl, PA-C [address] [place] [address] Phone: [phone]  Fax: [phone]  Return to Endocrinology  clinic as below: No future  appointments.

## 2021-08-24 ENCOUNTER — Other Ambulatory Visit: Payer: Self-pay | Admitting: Medical

## 2021-09-18 ENCOUNTER — Other Ambulatory Visit: Payer: Self-pay | Admitting: Medical

## 2021-09-25 ENCOUNTER — Other Ambulatory Visit: Payer: Self-pay

## 2021-09-25 ENCOUNTER — Encounter (HOSPITAL_COMMUNITY): Payer: Self-pay

## 2021-09-25 ENCOUNTER — Telehealth: Payer: Self-pay | Admitting: Cardiology

## 2021-09-25 ENCOUNTER — Emergency Department (HOSPITAL_COMMUNITY)
Admission: EM | Admit: 2021-09-25 | Discharge: 2021-09-25 | Disposition: A | Discharge status: Home / Self Care | Payer: Medicare Other | Attending: Emergency Medicine | Admitting: Emergency Medicine

## 2021-09-25 ENCOUNTER — Emergency Department (HOSPITAL_COMMUNITY): Payer: Medicare Other

## 2021-09-25 DIAGNOSIS — R5383 Other fatigue: Secondary | ICD-10-CM | POA: Diagnosis not present

## 2021-09-25 DIAGNOSIS — Z7984 Long term (current) use of oral hypoglycemic drugs: Secondary | ICD-10-CM | POA: Diagnosis not present

## 2021-09-25 DIAGNOSIS — R112 Nausea with vomiting, unspecified: Secondary | ICD-10-CM | POA: Diagnosis present

## 2021-09-25 DIAGNOSIS — R509 Fever, unspecified: Secondary | ICD-10-CM | POA: Diagnosis not present

## 2021-09-25 DIAGNOSIS — I480 Paroxysmal atrial fibrillation: Secondary | ICD-10-CM

## 2021-09-25 DIAGNOSIS — R000 Tachycardia, unspecified: Secondary | ICD-10-CM | POA: Diagnosis not present

## 2021-09-25 DIAGNOSIS — E119 Type 2 diabetes mellitus without complications: Secondary | ICD-10-CM | POA: Diagnosis not present

## 2021-09-25 DIAGNOSIS — R109 Unspecified abdominal pain: Secondary | ICD-10-CM | POA: Insufficient documentation

## 2021-09-25 DIAGNOSIS — I10 Essential (primary) hypertension: Secondary | ICD-10-CM | POA: Insufficient documentation

## 2021-09-25 DIAGNOSIS — R630 Anorexia: Secondary | ICD-10-CM | POA: Diagnosis not present

## 2021-09-25 DIAGNOSIS — R531 Weakness: Secondary | ICD-10-CM | POA: Insufficient documentation

## 2021-09-25 DIAGNOSIS — Z79899 Other long term (current) drug therapy: Secondary | ICD-10-CM | POA: Diagnosis not present

## 2021-09-25 LAB — CBC WITH DIFFERENTIAL/PLATELET
Abs Immature Granulocytes: 0.01 10*3/uL (ref 0.00–0.07)
Basophils Absolute: 0.1 10*3/uL (ref 0.0–0.1)
Basophils Relative: 1 %
Eosinophils Absolute: 0.1 10*3/uL (ref 0.0–0.5)
Eosinophils Relative: 1 %
HCT: 44.2 % (ref 36.0–46.0)
Hemoglobin: 14.2 g/dL (ref 12.0–15.0)
Immature Granulocytes: 0 %
Lymphocytes Relative: 29 %
Lymphs Abs: 2.6 10*3/uL (ref 0.7–4.0)
MCH: 26.4 pg (ref 26.0–34.0)
MCHC: 32.1 g/dL (ref 30.0–36.0)
MCV: 82.3 fL (ref 80.0–100.0)
Monocytes Absolute: 0.8 10*3/uL (ref 0.1–1.0)
Monocytes Relative: 8 %
Neutro Abs: 5.7 10*3/uL (ref 1.7–7.7)
Neutrophils Relative %: 61 %
Platelets: 253 10*3/uL (ref 150–400)
RBC: 5.37 MIL/uL — ABNORMAL HIGH (ref 3.87–5.11)
RDW: 14.8 % (ref 11.5–15.5)
WBC: 9.2 10*3/uL (ref 4.0–10.5)
nRBC: 0 % (ref 0.0–0.2)

## 2021-09-25 LAB — TROPONIN I (HIGH SENSITIVITY)
Troponin I (High Sensitivity): 6 ng/L (ref <18)
Troponin I (High Sensitivity): 7 ng/L (ref <18)

## 2021-09-25 LAB — COMPREHENSIVE METABOLIC PANEL
ALT: 21 U/L (ref 0–44)
AST: 17 U/L (ref 15–41)
Albumin: 4.3 g/dL (ref 3.5–5.0)
Alkaline Phosphatase: 83 U/L (ref 38–126)
Anion gap: 9 (ref 5–15)
BUN: 42 mg/dL — ABNORMAL HIGH (ref 8–23)
CO2: 26 mmol/L (ref 22–32)
Calcium: 12.2 mg/dL — ABNORMAL HIGH (ref 8.9–10.3)
Chloride: 101 mmol/L (ref 98–111)
Creatinine, Ser: 1.98 mg/dL — ABNORMAL HIGH (ref 0.44–1.00)
GFR, Estimated: 28 mL/min — ABNORMAL LOW (ref >60)
Glucose, Bld: 129 mg/dL — ABNORMAL HIGH (ref 70–99)
Potassium: 3.7 mmol/L (ref 3.5–5.1)
Sodium: 136 mmol/L (ref 135–145)
Total Bilirubin: 0.4 mg/dL (ref 0.3–1.2)
Total Protein: 8.4 g/dL — ABNORMAL HIGH (ref 6.5–8.1)

## 2021-09-25 LAB — LIPASE, BLOOD: Lipase: 45 U/L (ref 11–51)

## 2021-09-25 MED ORDER — RIVAROXABAN 15 MG PO TABS: 15.0000 mg | ORAL_TABLET | Freq: Every day | ORAL | 0 refills | Status: DC

## 2021-09-25 MED ORDER — SODIUM CHLORIDE 0.9 % IV BOLUS
1000.0000 mL | Freq: Once | INTRAVENOUS | Status: AC
  Administered 2021-09-25: 1000 mL via INTRAVENOUS

## 2021-09-25 MED ORDER — ONDANSETRON HCL 4 MG/2ML IJ SOLN
4.0000 mg | Freq: Once | INTRAMUSCULAR | Status: AC
  Administered 2021-09-25: 4 mg via INTRAVENOUS
  Filled 2021-09-25: qty 2

## 2021-09-25 MED ORDER — RIVAROXABAN 15 MG PO TABS
15.0000 mg | ORAL_TABLET | Freq: Once | ORAL | Status: AC
  Administered 2021-09-25: 15 mg via ORAL
  Filled 2021-09-25: qty 1

## 2021-09-25 MED ORDER — METOPROLOL TARTRATE 25 MG PO TABS
25.0000 mg | ORAL_TABLET | Freq: Once | ORAL | Status: AC
  Administered 2021-09-25: 25 mg via ORAL
  Filled 2021-09-25: qty 1

## 2021-09-25 NOTE — Telephone Encounter (Signed)
-----   Message from Satira Sark, MD sent at 09/25/2021  8:52 AM EST ----- Regarding: RE: Follow Up for Patient Thank you for the update.  Actually, she is a patient of Dr. Rudean Haskell in our group with known history of PAF last seen in 2021.  I will ask our office to make sure that she has a scheduled follow-up with him or APP the near future. ----- Message ----- From: Veryl Speak, MD Sent: 09/25/2021   5:27 AM EST To: Satira Sark, MD, Arnoldo Lenis, MD Subject: Follow Up for Patient                          Hello Drs. Branch/McDowell.  I saw this patient in the [facility], ER last night.  She presented with weakness, nausea, and some abdominal cramping.  She had the incidental finding of A. fib with semirapid RVR, but no idea when this started.  I spoke with the cardiology fellow who seemed to think she could go home on an increased dose of atenolol, and also recommended starting Xarelto due to a CHADS2 score of 4.  She had been on Xarelto in the past for paroxysmal A. fib, but was stopped after she had a Zio patch test done and apparently no A. fib was recorded.  I was hoping you could see to it that she has follow-up in the next several days.  She has been seen by Cardiology in the past, but does not claim to have a regular Cardiologist.  Thanks,  Trish Mage MD [facility] ED Physician

## 2021-09-25 NOTE — Telephone Encounter (Signed)
Called patient to inform her of upcoming appointment. No answer.

## 2021-09-25 NOTE — ED Triage Notes (Signed)
Pt complains of weakness. States that she has been having episodes of vomiting. X4 yesterday and once today.

## 2021-09-25 NOTE — ED Notes (Signed)
Pt ambulated to restroom with no assistance. ?

## 2021-09-25 NOTE — ED Provider Notes (Signed)
[facility] EMERGENCY DEPARTMENT Provider Note   CSN: [id number] Arrival date & time: 09/25/21  0057     History  Chief Complaint  Patient presents with   Weakness    Kathy [name] is a 66 y.o. female.  Patient is a 66 year old female with past medical history of type 2 diabetes, chronic renal insufficiency, hypertension, paroxysmal atrial fibrillation, esophageal reflux, hyperlipidemia.  Patient presenting today for evaluation of nausea, belching, decreased p.o. intake, and decreased stools for the past several days.  She does report several episodes of vomiting.  She also describes feeling very weak and fatigued.  She denies to me that she is experiencing abdominal pain, fevers, or chills.  She denies any urinary complaints.  The history is provided by the patient.  Weakness Severity:  Moderate Onset quality:  Gradual Duration:  3 days Timing:  Constant Progression:  Worsening Chronicity:  New Relieved by:  Nothing Exacerbated by: Eating and drinking. Ineffective treatments:  None tried Associated symptoms: nausea and vomiting       Home Medications Prior to Admission medications   Medication Sig Start Date End Date Taking? Authorizing Provider  allopurinol (ZYLOPRIM) 100 MG tablet Take 2 tablets (200 mg total) by mouth daily. 03/04/21   Tysinger, Camelia Eng, PA-C  amLODipine-olmesartan (AZOR) 10-40 MG tablet Take 1 tablet by mouth daily. 03/03/21   Tysinger, Camelia Eng, PA-C  atenolol (TENORMIN) 50 MG tablet TAKE 1 TABLET(50 MG) BY MOUTH DAILY 08/24/21   Tysinger, Camelia Eng, PA-C  blood glucose meter kit and supplies KIT Dispense based on patient and insurance preference. Use up to four times daily as directed. (FOR ICD-9 250.00, 250.01). 03/14/18   Tysinger, Camelia Eng, PA-C  cholecalciferol (VITAMIN D3) 25 MCG (1000 UNIT) tablet Take 1 tablet (1,000 Units total) by mouth daily. 03/04/21   Tysinger, Camelia Eng, PA-C  ferrous sulfate 325 (65 FE) MG EC tablet Take 65 mg by mouth 3  (three) times daily with meals.    [provider]  glipiZIDE (GLUCOTROL) 5 MG tablet TAKE 1 TABLET BY MOUTH DAILY BEFORE BREAKFAST AND 2 TABLETS DAILY BEFORE SUPPER 06/29/21   Shamleffer, Melanie Crazier, MD  glucose blood test strip Contour Next test strips. Use as instructed 05/10/19   Tysinger, Camelia Eng, PA-C  hydrALAZINE (APRESOLINE) 10 MG tablet TAKE 1 TABLET BY MOUTH TWICE DAILY 06/23/21   Werner Lean, MD  Multiple Vitamin (MULTIVITAMIN WITH MINERALS) TABS tablet Take 1 tablet by mouth daily.    [provider]  rosuvastatin (CRESTOR) 20 MG tablet TAKE 1 TABLET(20 MG) BY MOUTH AT BEDTIME 09/18/21   Tysinger, Camelia Eng, PA-C  vitamin C (ASCORBIC ACID) 250 MG tablet Take 250 mg by mouth daily.    [provider]      Allergies    Patient has no known allergies.    Review of Systems   Review of Systems  Gastrointestinal:  Positive for nausea and vomiting.  Neurological:  Positive for weakness.  All other systems reviewed and are negative.  Physical Exam Updated Vital Signs BP (!) 162/102 (BP Location: Left Arm)    Temp 99.4 F (37.4 C) (Oral)    Resp 17    Ht 5' 8"  (1.727 m)    Wt 95.3 kg    SpO2 100%    BMI 31.93 kg/m  Physical Exam Vitals and nursing note reviewed.  Constitutional:      General: She is not in acute distress.    Appearance: She is well-developed. She is  not diaphoretic.  HENT:     Head: Normocephalic and atraumatic.  Cardiovascular:     Rate and Rhythm: Tachycardia present. Rhythm irregular.     Heart sounds: No murmur heard.   No friction rub. No gallop.  Pulmonary:     Effort: Pulmonary effort is normal. No respiratory distress.     Breath sounds: Normal breath sounds. No wheezing.  Abdominal:     General: Bowel sounds are normal. There is no distension.     Palpations: Abdomen is soft.     Tenderness: There is no abdominal tenderness.  Musculoskeletal:        General: Normal range of motion.     Cervical back: Normal  range of motion and neck supple.  Skin:    General: Skin is warm and dry.  Neurological:     General: No focal deficit present.     Mental Status: She is alert and oriented to person, place, and time.    ED Results / Procedures / Treatments   Labs (all labs ordered are listed, but only abnormal results are displayed) Labs Reviewed  COMPREHENSIVE METABOLIC PANEL  LIPASE, BLOOD  CBC WITH DIFFERENTIAL/PLATELET  TROPONIN I (HIGH SENSITIVITY)    EKG EKG Interpretation  Date/Time:  Friday September 25 2021 02:57:23 EST Ventricular Rate:  118 PR Interval:    QRS Duration: 103 QT Interval:  288 QTC Calculation: 404 R Axis:   77 Text Interpretation: Atrial fibrillation Borderline repolarization abnormality Confirmed by Veryl Speak [phone]) on 09/25/2021 3:12:11 AM  Radiology No results found.  Procedures Procedures  Continuous cardiac monitoring  Medications Ordered in ED Medications  sodium chloride 0.9 % bolus 1,000 mL (has no administration in time range)  ondansetron (ZOFRAN) injection 4 mg (has no administration in time range)    ED Course/ Medical Decision Making/ A&P  This patient presents to the ED for concern of weakness, nausea, this involves an extensive number of treatment options, and is a complaint that carries with it a high risk of complications and morbidity.  The differential diagnosis includes gastroenteritis, cholecystitis, ACS   Co morbidities that complicate the patient evaluation  None   Additional history obtained:  External records from outside source obtained and reviewed including cardiology office notes   Lab Tests:  I Ordered, and personally interpreted labs.  The pertinent results include: CBC, basic metabolic panel, lipase, and troponin x2.  All of these were unremarkable   Imaging Studies ordered:  I ordered imaging studies including CT scan of the abdomen and pelvis I independently visualized and interpreted imaging which showed  no acute intra-abdominal process I agree with the radiologist interpretation   Cardiac Monitoring:  The patient was maintained on a cardiac monitor.  I personally viewed and interpreted the cardiac monitored which showed an underlying rhythm of: Atrial fibrillation   Medicines ordered and prescription drug management:  I ordered medication including Xarelto as a blood thinner, metoprolol for rate control, and Zofran for nausea Reevaluation of the patient after these medicines showed that the patient improved I have reviewed the patients home medicines and have made adjustments as needed   Test Considered:  No other tests considered are indicated   Critical Interventions:  Anticoagulation and medication for nausea.  IV fluids.   Consultations Obtained:  I requested consultation with the cardiology fellow, Dr. Hassell Done,  and discussed lab and imaging findings as well as pertinent plan - they recommend: Patient seems suitable for discharge with anticoagulation due to a CHADS2 score of  4.  We will also increase her atenolol for increased rate control as her blood pressure is permissive of this   Problem List / ED Course:  Patient presenting here with nausea, weakness, and feeling generally unwell.  This started several days ago and is worsening.  Her abdomen is benign and CT scan shows no acute intra-abdominal process. Her EKG does show the incidental finding of atrial fibrillation with RVR.  Patient does not know when this started, but it does appear as though she has had this happen in the past.  She was on Xarelto at one point, however this was stopped after a Zio patch test revealed low A. fib burden. Care was discussed with Dr. Hassell Done from cardiology who seems to think patient can safely be managed at home with an increased dose of her metoprolol and being restarted on Xarelto.  Patient and husband comfortable with the disposition.   Reevaluation:  After the interventions noted  above, I reevaluated the patient and found that they have :improved   Social Determinants of Health:  None   Dispostion:  After consideration of the diagnostic results and the patients response to treatment, I feel that the patent would benefit from anticoagulation and better rate control.  CRITICAL CARE Performed by: Veryl Speak Total critical care time: 40 minutes Critical care time was exclusive of separately billable procedures and treating other patients. Critical care was necessary to treat or prevent imminent or life-threatening deterioration. Critical care was time spent personally by me on the following activities: development of treatment plan with patient and/or surrogate as well as nursing, discussions with consultants, evaluation of patient's response to treatment, examination of patient, obtaining history from patient or surrogate, ordering and performing treatments and interventions, ordering and review of laboratory studies, ordering and review of radiographic studies, pulse oximetry and re-evaluation of patient's condition.     Final Clinical Impression(s) / ED Diagnoses Final diagnoses:  None    Rx / DC Orders ED Discharge Orders     None         Veryl Speak, MD 09/25/21 [phone]

## 2021-09-25 NOTE — Discharge Instructions (Addendum)
Begin taking Xarelto as prescribed.  Increase your dose of atenolol to 100 mg daily.  You should follow-up with cardiology in the next 3 to 4 days.  The contact information for the cardiology office here at [facility] has been provided in this discharge summary for you to call and make these arrangements.  Return to the emergency department if you experience any new and/or concerning symptoms.

## 2021-10-02 ENCOUNTER — Other Ambulatory Visit: Payer: Self-pay

## 2021-10-02 ENCOUNTER — Encounter: Payer: Self-pay | Admitting: Student

## 2021-10-02 ENCOUNTER — Ambulatory Visit (INDEPENDENT_AMBULATORY_CARE_PROVIDER_SITE_OTHER): Payer: Medicare Other | Admitting: Student

## 2021-10-02 ENCOUNTER — Ambulatory Visit (HOSPITAL_BASED_OUTPATIENT_CLINIC_OR_DEPARTMENT_OTHER): Payer: Medicare Other | Admitting: Family

## 2021-10-02 VITALS — BP 130/86 | HR 66 | Ht 68.0 in | Wt 208.8 lb

## 2021-10-02 DIAGNOSIS — E782 Mixed hyperlipidemia: Secondary | ICD-10-CM | POA: Diagnosis not present

## 2021-10-02 DIAGNOSIS — N184 Chronic kidney disease, stage 4 (severe): Secondary | ICD-10-CM | POA: Diagnosis not present

## 2021-10-02 DIAGNOSIS — N281 Cyst of kidney, acquired: Secondary | ICD-10-CM

## 2021-10-02 DIAGNOSIS — I480 Paroxysmal atrial fibrillation: Secondary | ICD-10-CM | POA: Diagnosis not present

## 2021-10-02 DIAGNOSIS — I10 Essential (primary) hypertension: Secondary | ICD-10-CM | POA: Diagnosis not present

## 2021-10-02 MED ORDER — ATENOLOL 50 MG PO TABS: 50.0000 mg | ORAL_TABLET | Freq: Two times a day (BID) | ORAL | 2 refills | Status: DC

## 2021-10-02 MED ORDER — RIVAROXABAN 15 MG PO TABS: 15.0000 mg | ORAL_TABLET | Freq: Every day | ORAL | 2 refills | Status: DC

## 2021-10-02 NOTE — Patient Instructions (Signed)
Medication Instructions:   Will increase Atenolol to 50mg  twice daily. Continue Xarelto for anticoagulation.   *If you need a refill on your cardiac medications before your next appointment, please call your pharmacy*   Follow-Up: At [facility], you and your health needs are our priority.  As part of our continuing mission to provide you with exceptional heart care, we have created designated Provider Care Teams.  These Care Teams include your primary Cardiologist (physician) and Advanced Practice Providers (APPs -  Physician Assistants and Nurse Practitioners) who all work together to provide you with the care you need, when you need it.  We recommend signing up for the patient portal called "MyChart".  Sign up information is provided on this After Visit Summary.  MyChart is used to connect with patients for Virtual Visits (Telemedicine).  Patients are able to view lab/test results, encounter notes, upcoming appointments, etc.  Non-urgent messages can be sent to your provider as well.   To learn more about what you can do with MyChart, go to [email or web address].    Your next appointment:   6 month(s)  The format for your next appointment:   In Person  Provider:   You may see Werner Lean, MD or one of the following Advanced Practice Providers on your designated Care Team:   [place], PA-C  Ermalinda Barrios, [place]

## 2021-10-02 NOTE — Progress Notes (Signed)
Cardiology Office Note    Date:  10/03/2021   ID:  Crystle, Carelli [birth year], MRN [id number]  PCP:  Carlena Hurl, PA-C  Cardiologist: Werner Lean, MD    Chief Complaint  Patient presents with   Follow-up    Recent Emergency Dept visit for atrial fibrillation    History of Present Illness:    Kathy [name] is a 66 y.o. female with past medical history of paroxysmal atrial fibrillation (diagnosed in 05/2016 with no known recurrence and no AF by event monitor in 06/2020), HTN, HLD, Type 2 DM, Stage 4 CKD and adrenal adenoma who presents to the office today for follow-up from a recent Emergency Department visit.   She was last examined by Dr. Gasper Sells in 05/2020 and denied any recent chest pain or palpitations at that time. Given her CKD, Atenolol was discontinued and she was started on Labetalol 300 mg twice daily and a Zio patch was recommended to assess for any recurrent atrial fibrillation. She called the office a week later after starting Labetalol reported dizziness and presyncope with the medication, therefore Labetalol was discontinued and she was restarted on Atenolol along with Hydralazine. Was informed to follow-up with Cardiology in 6 months but has not been evaluated since.  She did present to the ED on 09/25/2021 for evaluation of weakness and vomiting. Labs showed WBC 9.2, Hgb 14.2, platelets 253, Na+ 136, K+ 3.7 and creatinine 1.98. LFT's within a normal range. Initial and delta hs Troponin values negative at 6 and 7. CT Abdomen showed no acute findings but was noted to have constipation along with diverticulosis. Was also noted to have multiple renal cysts and a hyperdense lesion in the right upper pole which was overall felt to be similar to prior MRI with repeat imaging recommended for comparison. She was found to be in atrial fibrillation with RVR and it was recommended she increase Atenolol to 100 mg daily and start Xarelto 10m daily for  anticoagulation.  In talking with the patient, she reports overall doing well since her recent ER evaluation and says her nausea and vomiting have now resolved. Says that she had experienced hypoglycemia that day and feels like this might have contributed to her atrial fibrillation. She does report intermittent palpitations but says they usually spontaneously resolve. No reported exertional chest pain, dyspnea on exertion, orthopnea, PND or pitting edema.   Past Medical History:  Diagnosis Date   Atrial fibrillation ([facility] 05/2016   Diabetes mellitus without complication ([place]    type II   History of Helicobacter pylori infection    Hyperlipidemia    Hypertension    Microalbuminuria 2016   Obesity     Past Surgical History:  Procedure Laterality Date   COLONOSCOPY     age 66yo   Current Medications: Outpatient Medications Prior to Visit  Medication Sig Dispense Refill   allopurinol (ZYLOPRIM) 100 MG tablet Take 2 tablets (200 mg total) by mouth daily. 180 tablet 3   amLODipine-olmesartan (AZOR) 10-40 MG tablet Take 1 tablet by mouth daily. 90 tablet 3   blood glucose meter kit and supplies KIT Dispense based on patient and insurance preference. Use up to four times daily as directed. (FOR ICD-9 250.00, 250.01). 1 each 0   cholecalciferol (VITAMIN D3) 25 MCG (1000 UNIT) tablet Take 1 tablet (1,000 Units total) by mouth daily. 90 tablet 3   ferrous sulfate 325 (65 FE) MG EC tablet Take 65 mg by mouth 3 (three) times daily  with meals.     glipiZIDE (GLUCOTROL) 5 MG tablet TAKE 1 TABLET BY MOUTH DAILY BEFORE BREAKFAST AND 2 TABLETS DAILY BEFORE SUPPER 270 tablet 1   glucose blood test strip Contour Next test strips. Use as instructed 100 each 6   hydrALAZINE (APRESOLINE) 10 MG tablet TAKE 1 TABLET BY MOUTH TWICE DAILY 30 tablet 0   Multiple Vitamin (MULTIVITAMIN WITH MINERALS) TABS tablet Take 1 tablet by mouth daily.     rosuvastatin (CRESTOR) 20 MG tablet TAKE 1 TABLET(20 MG) BY  MOUTH AT BEDTIME 90 tablet 0   vitamin C (ASCORBIC ACID) 250 MG tablet Take 250 mg by mouth daily.     atenolol (TENORMIN) 50 MG tablet TAKE 1 TABLET(50 MG) BY MOUTH DAILY 90 tablet 0   Rivaroxaban (XARELTO) 15 MG TABS tablet Take 1 tablet (15 mg total) by mouth daily with supper. 90 tablet 0   No facility-administered medications prior to visit.     Allergies:   Patient has no known allergies.   Social History   Socioeconomic History   Marital status: Married    Spouse name: Not on file   Number of children: Not on file   Years of education: Not on file   Highest education level: Not on file  Occupational History   Not on file  Tobacco Use   Smoking status: Never   Smokeless tobacco: Never  Vaping Use   Vaping Use: Never used  Substance and Sexual Activity   Alcohol use: Not Currently    Comment: rarely   Drug use: No   Sexual activity: Not on file  Other Topics Concern   Not on file  Social History Narrative   Lives with husband and son.   Retired.    Does housework, cooks, clean, busy.  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   Review of Systems:    Please see the history of present illness.     All other systems reviewed and are otherwise negative except as noted above.   Physical Exam:    VS:  BP 130/86    Pulse 66    Ht _0  (1.727 m)    Wt 208 lb 12.8 oz (94.7 kg)    SpO2 99%    BMI 31.75 kg/m    General: Well developed, well nourished,female appearing in no acute distress. Head: Normocephalic, atraumatic. Neck: No carotid bruits. JVD not elevated.  Lungs:  Respirations regular and unlabored, without wheezes or rales.  Heart: Regular rate and rhythm. No S3 or S4.  No murmur, no rubs, or gallops appreciated. Abdomen: Appears non-distended. No obvious abdominal masses. Msk:  Strength and tone appear normal for age. No obvious joint deformities or effusions. Extremities: No clubbing or cyanosis. No pitting edema.  Distal pedal pulses are 2+ bilaterally. Neuro: Alert and oriented X 3. Moves all extremities spontaneously. No focal deficits noted. Psych:  Responds to questions appropriately with a normal affect. Skin: No rashes or lesions noted  Wt Readings from Last 3 Encounters:  10/02/21 208 lb 12.8 oz (94.7 kg)  09/25/21 210 lb (95.3 kg)  07/23/21 210 lb 9.6 oz (95.5 kg)     Studies/Labs Reviewed:   EKG:  EKG is ordered today. EKG personally reviewed and shows NSR, HR 66 with 1st degree AV block.   Recent Labs: 09/25/2021: ALT 21; BUN 42; Creatinine, Ser 1.98; Hemoglobin 14.2; Platelets 253; Potassium 3.7; Sodium 136   Lipid Panel    Component Value Date/Time   CHOL 159 03/03/2021 0921   TRIG 99 03/03/2021 0921   HDL 78 03/03/2021 0921   CHOLHDL 2.0 03/03/2021 0921   CHOLHDL 2.1 05/24/2016 1042   VLDL 15 05/24/2016 1042   LDLCALC 63 03/03/2021 0921    Additional studies/ records that were reviewed today include:   Echocardiogram: 05/2016 Study Conclusions   - Left ventricle: The cavity size was normal. There was mild    concentric hypertrophy. Systolic function was normal. The    estimated ejection fraction was in the range of 55% to 60%. Wall    motion was normal; there were no regional wall motion    abnormalities. Left ventricular diastolic function parameters    were normal.  - Aortic valve: Trileaflet; normal thickness leaflets.  - Aortic root: The aortic root was normal in size.  - Mitral valve: Structurally normal valve. There was no    regurgitation.  - Left atrium: The atrium was mildly dilated.  - Right  ventricle: The cavity size was normal. Wall thickness was    normal. Systolic function was normal.  - Right atrium: The atrium was normal in size.  - Tricuspid valve: There was no regurgitation.  - Pulmonic valve: There was no regurgitation.  - Pulmonary arteries: Systolic pressure was within the normal    range.  - Inferior vena cava: The vessel was normal in size.  - Pericardium, extracardiac: There was no pericardial effusion.   Event Monitor: 06/2020 Patient had a min HR of 50 bpm, max HR of 92 bpm, and avg HR of 67 bpm. Predominant underlying rhythm was sinus rhythm. 1st degree HB is present. Isolated PACs were rare (<1.0%) with rare couplets. Isolated PVCs were rare (<1.0%). No atrial fibrillation burden.   No malignant arrhythmias.  Assessment:    1. PAF (paroxysmal atrial fibrillation) ([place])   2. Essential hypertension   3. Mixed hyperlipidemia   4. CKD (chronic kidney disease) stage 4, GFR 15-29 ml/min (HCC)   5. Renal cyst      Plan:   In order of problems listed above:  1. Paroxysmal Atrial Fibrillation - This was initially diagnosed in 05/2016 with no known recurrence until her recent ER visit this month. She has spontaneously converted back to NSR by repeat EKG today. Reports some brief palpitations at times but was unaware of her atrial fibrillation earlier this month. Will continue Atenolol at the recently increased dose of 61m BID.  - Reviewed indications for anticoagulation given her CHA2DS2-VASc Score of at least 4 and will continue Xarelto 15mdaily which is the appropriate dose given her calculated creatinine clearance of 42 mL/min.   2. HTN - Her BP is well-controlled at 130/86 during today's visit. Continue current medication regimen with Amlodipine-Olmesartan, Atenolol and Hydralazine. Will defer continuation of ARB to Nephrology.   3. HLD - Followed by her PCP. LDL was at 63 in 2022. Remains on Crestor 2015maily.   4. Stage 4 CKD - Creatinine  was at 1.98 when checked earlier this month which is improved from prior values. Followed by Nephrology.   5. Abnormal  CT/Renal Cysts - Recent CT showed multiple renal cysts and a hyperdense lesion in the right upper pole which was overall felt to be similar to prior MRI with repeat imaging recommended for comparison. She has upcoming follow-up with Nephrology and will review with them.    Medication Adjustments/Labs and Tests Ordered: Current medicines are reviewed at length with the patient today.  Concerns regarding medicines are outlined above.  Medication changes, Labs and Tests ordered today are listed in the Patient Instructions below. Patient Instructions  Medication Instructions:   Will increase Atenolol to 8m twice daily. Continue Xarelto for anticoagulation.   *If you need a refill on your cardiac medications before your next appointment, please call your pharmacy*   Follow-Up: [facility] and your [facility] needs are our [facility].  As part of our continuing mission to provide you with exceptional heart care, we have created designated Provider Care Teams.  These Care Teams include your primary Cardiologist (physician) and Advanced Practice Providers (APPs -  Physician Assistants and Nurse Practitioners) who all work together to provide you with the care you need, when you need it.  We recommend signing up for the patient portal called "MyChart".  Sign up information is provided on this After Visit Summary.  MyChart is used to connect with patients for Virtual Visits (Telemedicine).  Patients are able to view lab/test results, encounter notes, upcoming appointments, etc.  Non-urgent messages can be sent to your provider as well.   To learn more about what you can do with MyChart, go to [email or web address]    Your next appointment:   6 month(s)  The format for your next appointment:   In Person  Provider:   You may see MWerner Lean MD or one of the  following Advanced Practice Providers on your designated Care Team:   BBernerd Pho PA-C  MErmalinda Barrios PA-C       Signed, BErma Heritage [place] 10/03/2021 9:01 AM    [place] [address]RMason [place] [address]Phone: [phone]: [phone]

## 2021-10-03 ENCOUNTER — Encounter: Payer: Self-pay | Admitting: Student

## 2021-10-06 ENCOUNTER — Other Ambulatory Visit: Payer: Self-pay | Admitting: Internal Medicine

## 2021-11-13 ENCOUNTER — Encounter: Payer: Self-pay | Admitting: Medical

## 2021-11-20 ENCOUNTER — Encounter: Payer: Self-pay | Admitting: Internal Medicine

## 2021-11-20 ENCOUNTER — Ambulatory Visit (INDEPENDENT_AMBULATORY_CARE_PROVIDER_SITE_OTHER): Payer: Medicare Other | Admitting: Internal Medicine

## 2021-11-20 ENCOUNTER — Other Ambulatory Visit: Payer: Self-pay

## 2021-11-20 VITALS — BP 126/70 | HR 60 | Ht 68.0 in | Wt 215.0 lb

## 2021-11-20 DIAGNOSIS — D3502 Benign neoplasm of left adrenal gland: Secondary | ICD-10-CM

## 2021-11-20 DIAGNOSIS — E1165 Type 2 diabetes mellitus with hyperglycemia: Secondary | ICD-10-CM

## 2021-11-20 LAB — BASIC METABOLIC PANEL
BUN: 58 mg/dL — ABNORMAL HIGH (ref 6–23)
CO2: 24 mEq/L (ref 19–32)
Calcium: 10.3 mg/dL (ref 8.4–10.5)
Chloride: 101 mEq/L (ref 96–112)
Creatinine, Ser: 2.08 mg/dL — ABNORMAL HIGH (ref 0.40–1.20)
GFR: 24.55 mL/min — ABNORMAL LOW (ref >60.00)
Glucose, Bld: 201 mg/dL — ABNORMAL HIGH (ref 70–99)
Potassium: 5.1 mEq/L (ref 3.5–5.1)
Sodium: 134 mEq/L — ABNORMAL LOW (ref 135–145)

## 2021-11-20 LAB — VITAMIN D 25 HYDROXY (VIT D DEFICIENCY, FRACTURES): VITD: 36.49 ng/mL (ref 30.00–100.00)

## 2021-11-20 LAB — POCT GLYCOSYLATED HEMOGLOBIN (HGB A1C): Hemoglobin A1C: 8.5 % — AB (ref 4.0–5.6)

## 2021-11-20 LAB — ALBUMIN: Albumin: 4.1 g/dL (ref 3.5–5.2)

## 2021-11-20 MED ORDER — INSULIN PEN NEEDLE 32G X 4 MM MISC: 1.0000 | Freq: Three times a day (TID) | 3 refills | Status: DC

## 2021-11-20 MED ORDER — GLIPIZIDE 10 MG PO TABS: 10.0000 mg | ORAL_TABLET | Freq: Two times a day (BID) | ORAL | 3 refills | Status: DC

## 2021-11-20 MED ORDER — INSULIN LISPRO (1 UNIT DIAL) 100 UNIT/ML (KWIKPEN): 5.0000 [IU] | PEN_INJECTOR | Freq: Three times a day (TID) | SUBCUTANEOUS | 6 refills | Status: DC

## 2021-11-20 NOTE — Patient Instructions (Addendum)
STOP Glipizide  ?Start Humalog 5 units with each meal  ? ? ?HOW TO TREAT LOW BLOOD SUGARS (Blood sugar LESS THAN 70 MG/DL) ?Please follow the RULE OF 15 for the treatment of hypoglycemia treatment (when your (blood sugars are less than 70 mg/dL)  ? ?STEP 1: Take 15 grams of carbohydrates when your blood sugar is low, which includes:  ?3-4 GLUCOSE TABS  OR ?3-4 OZ OF JUICE OR REGULAR SODA OR ?ONE TUBE OF GLUCOSE GEL   ? ?STEP 2: RECHECK blood sugar in 15 MINUTES ?STEP 3: If your blood sugar is still low at the 15 minute recheck --> then, go back to STEP 1 and treat AGAIN with another 15 grams of carbohydrates. ? ?

## 2021-11-20 NOTE — Progress Notes (Signed)
Name: Kathy [name]  Age/ Sex: 66 y.o., female   MRN/ DOB: [id number], 10-16-1955     PCP: Caryl Ada   Reason for Endocrinology Evaluation: Type 2 Diabetes Mellitus  Initial Endocrine Consultative Visit: 05/31/2019    PATIENT IDENTIFIER: Kathy [name] is a 66 y.o. female with a past medical history of T2Dm, HTN, A.Fib. The patient has followed with Endocrinology clinic since 05/31/2019 for consultative assistance with management of her diabetes.  DIABETIC HISTORY:  Kathy [name] was diagnosed with T2DM in 2001. [place] Glade, Glipizide (Stopped 02/2019), tradjenta, metfoRMIN . Rybelsus started 04/2019 but did not help so she stopped it and restarted metformin, in 05/2019 metformin was stopped again and was advised not to take it due to low GFR. Her hemoglobin A1c has ranged from 6.7% in 2015, peaking at 10.4% in 2020.  Ozempic started 01/2020  ADRENAL HISTORY: Pt was noted to have an incidental finding of a left adrenal adenoma 1.4 cm on MRI from 05/2018 during evaluation of a renal cysts. A CT scan of adrenal ( no contrast due to CKD and pt declined MRI 03/05/2020) showed an indeterminate left adrenal adenoma at 1 cm.    Labs were normal to include aldo, cortisol and metanephrines.   SUBJECTIVE:   During the last visit (07/23/2021): A1c 6.9% .  continued glipized and increased Ozempic     Today (11/20/2021): Kathy [name] is here for a follow up on diabetes management.  She checks her blood sugars 1-2 times daily, preprandial to breakfast and bedtime. The patient has not had hypoglycemic episodes since the last clinic visit.      She presented to the ED 05/2021 with decreased urine output and was diagnosed with dehydration, she stopped Ozempic attributing dehydration to it   She has been noted with hypercalcemia in 09/2021 She was asked to avoid Vitamin D by nephrologist , she is not on calcium supplements.       HOME DIABETES REGIMEN:  Glipizide 5 mg, 1 tablet  before Breakfast and 2 tablet before supper      GLUCOSE LOG :  30 day average 165 mg/dL    DIABETIC COMPLICATIONS: Microvascular complications:  CKDIII Denies: retinopathy , neuropathy  Last eye exam: scheduled 12/2021   Macrovascular complications:    Denies: CAD, PVD, CVA       HISTORY:  Past Medical History:  Past Medical History:  Diagnosis Date   Atrial fibrillation ([place]) 05/2016   Diabetes mellitus without complication ([place])    type II   History of Helicobacter pylori infection    Hyperlipidemia    Hypertension    Microalbuminuria 2016   Obesity    Past Surgical History:  Past Surgical History:  Procedure Laterality Date   COLONOSCOPY     age 7yo   Social History:  reports that she has never smoked. She has never used smokeless tobacco. She reports that she does not currently use alcohol. She reports that she does not use drugs. Family History:  Family History  Problem Relation Age of Onset   Stroke Mother    Cancer Father        throat   Diabetes Sister    Diabetes Sister    Diabetes Sister    Cancer Sister        breast   Breast cancer Sister    Heart disease Neg Hx      HOME MEDICATIONS: Allergies as of 11/20/2021   No Known Allergies  Medication List        Accurate as of November 20, 2021  8:02 AM. If you have any questions, ask your nurse or doctor.          allopurinol 100 MG tablet Commonly known as: ZYLOPRIM Take 2 tablets (200 mg total) by mouth daily.   amLODipine-olmesartan 10-40 MG tablet Commonly known as: AZOR Take 1 tablet by mouth daily.   atenolol 50 MG tablet Commonly known as: TENORMIN Take 1 tablet (50 mg total) by mouth 2 (two) times daily.   blood glucose meter kit and supplies Kit Dispense based on patient and insurance preference. Use up to four times daily as directed. (FOR ICD-9 250.00, 250.01).   cholecalciferol 25 MCG (1000 UNIT) tablet Commonly known as: VITAMIN D3 Take 1 tablet (1,000  Units total) by mouth daily.   ferrous sulfate 325 (65 FE) MG EC tablet Take 65 mg by mouth 3 (three) times daily with meals.   glipiZIDE 10 MG tablet Commonly known as: GLUCOTROL Take 1 tablet (10 mg total) by mouth 2 (two) times daily before a meal. What changed:  medication strength See the new instructions. Changed by: Dorita Sciara, MD   glucose blood test strip Contour Next test strips. Use as instructed   hydrALAZINE 10 MG tablet Commonly known as: APRESOLINE TAKE 1 TABLET BY MOUTH TWICE DAILY   multivitamin with minerals Tabs tablet Take 1 tablet by mouth daily.   Rivaroxaban 15 MG Tabs tablet Commonly known as: XARELTO Take 1 tablet (15 mg total) by mouth daily with supper.   rosuvastatin 20 MG tablet Commonly known as: CRESTOR TAKE 1 TABLET(20 MG) BY MOUTH AT BEDTIME   vitamin C 250 MG tablet Commonly known as: ASCORBIC ACID Take 250 mg by mouth daily.         OBJECTIVE:   Vital Signs:BP 126/70 (BP Location: Left Arm, Patient Position: Sitting, Cuff Size: Large)    Pulse 60    Ht 5' 8"  (1.727 m)    Wt 215 lb (97.5 kg)    SpO2 99%    BMI 32.69 kg/m   Wt Readings from Last 3 Encounters:  11/20/21 215 lb (97.5 kg)  10/02/21 208 lb 12.8 oz (94.7 kg)  09/25/21 210 lb (95.3 kg)     Exam: General: Pt appears well and is in NAD  Lungs: Clear with good BS bilat with no rales, rhonchi, or wheezes  Heart: RRR with normal S1 and S2 and no gallops; no murmurs; no rub  Abdomen: Normoactive bowel sounds, soft, nontender, without masses or organomegaly palpable  Extremities: No pretibial edema.  Skin: Normal texture and temperature to palpation. No rash noted. No Acanthosis nigricans/skin tags. No lipohypertrophy.  Neuro: MS is good with appropriate affect, pt is alert and Ox3      DM foot exam: 09/18/2020   The skin of the feet is intact without sores or ulcerations. The pedal pulses are 2+ on right and 2+ on left. The sensation is intact to a  screening 5.07, 10 gram monofilament bilaterally    DATA REVIEWED:  Lab Results  Component Value Date   HGBA1C 8.5 (A) 11/20/2021   HGBA1C 6.9 (A) 07/23/2021   HGBA1C 8.6 (A) 03/19/2021    Latest Reference Range & Units 11/20/21 08:22  Sodium 135 - 145 mEq/L 134 (L)  Potassium 3.5 - 5.1 mEq/L 5.1  Chloride 96 - 112 mEq/L 101  CO2 19 - 32 mEq/L 24  Glucose 70 - 99 mg/dL 201 (H)  BUN 6 - 23 mg/dL 58 (H)  Creatinine 0.40 - 1.20 mg/dL 2.08 (H)  Calcium 8.4 - 10.5 mg/dL 10.3    Latest Reference Range & Units 11/20/21 08:22  GFR >60.00 mL/min 24.55 (L)    Latest Reference Range & Units 11/20/21 08:22  VITD 30.00 - 100.00 ng/mL 36.49      Results for SAMERA, MACY (MRN [id number]) as of 07/23/2021 07:37  Ref. Range 03/19/2021 11:13  ALDOSTERONE Latest Ref Range: 0.0 - 30.0 ng/dL <1.0  Renin Latest Ref Range: 0.167 - 5.380 ng/mL/hr 3.129  ALDOS/RENIN RATIO Latest Ref Range: 0.0 - 30.0  <.3   Results for ZETTIE, GOOTEE (MRN [id number]) as of 07/23/2021 07:37  Ref. Range 02/04/2020 09:52  Cortisol (Ur), Free Latest Ref Range: 4.0 - 50.0 mcg/24 h 6.7   Results for EILIYAH, REH (MRN [id number]) as of 07/23/2021 10:26  Ref. Range 03/23/2021 11:13  Dopamine 24 Hr Urine Latest Ref Range: 0 - 510 ug/24 hr 71  Dopamine, Rand Ur Latest Ref Range: Undefined ug/L 23  Epinephrine 24 Hr Urine Latest Ref Range: 0 - 20 ug/24 hr 3  Epinephrine, Rand Ur Latest Ref Range: Undefined ug/L 1  Norepinephrine, Rand Ur Latest Ref Range: Undefined ug/L 13  Norepinephrine, 24H Ur Latest Ref Range: 0 - 135 ug/24 hr 40  VMA, Urine Latest Ref Range: Undefined mg/L 0.5  VMA, 24H Ur Adult Latest Ref Range: 0.0 - 7.5 mg/24 hr 1.5    CT scan 03/05/2020 Adrenals/Urinary Tract: Indeterminate 1 cm left adrenal nodule. This can be better characterized with MRI or dedicated adrenal protocol CT. The right adrenal gland is unremarkable. There is no hydronephrosis or nephrolithiasis on either  side. Subcentimeter bilateral renal hypodense and slightly higher attenuating lesions are not characterized on this CT but may represent complex cysts. These can be better evaluated with MRI or ultrasound. The visualized ureters appear unremarkable.  ASSESSMENT / PLAN / RECOMMENDATIONS:   1) Type 2 Diabetes Mellitus, Poorly controlled, With CKD III complications - Most recent A1c of 8.5 %. Goal A1c < 7.0 %.    - Declines GLP-1 agonists as she attributes dehydration and low GFR to them  - Declines CGM technology  - Pt would like to change Glipizide to something else as she does not like to wait 30 minutes before she eats, we discussed prandial insulin and she is on board with this but by the time she went to pick it up it was costly and opted to stay on Glipizide  -She was trained by my CMA for insulin pen use today -Patient to contact us with hypo or hyperglycemia     MEDICATIONS:  -Change  glipizide 10 mg BID       EDUCATION / INSTRUCTIONS: BG monitoring instructions: Patient is instructed to check her blood sugars 3 times a day, before meals  Call [facility] if: BG persistently < 70 I reviewed the Rule of 15 for the treatment of hypoglycemia in detail with the patient. Literature supplied.  2. Left Adrenal Adenoma :   - Stable imaging (02/2020) - No clinical or biochemical evidence of extra hormonal excretion  - Will repeat Aldo, renin and 24-hr urinary cortisol   3. Hypercalcemia :   - She is off calcium and Vitamin D  - Will proceed with PTH check and 24- hr urinary excretion of calcium  - Serum calcium and Vitamin D normal.    F/U in 4 months    Signed electronically by: Maretta Bees  Nena Jordan, MD  [facility]  [facility] [address] [place], [place] [address] Phone: [phone] FAX: [phone]   CC: Carlena Hurl, PA-C [place] [id number] Phone: [phone]  Fax:  [phone]  Return to Endocrinology clinic as below: No future appointments.

## 2021-11-23 ENCOUNTER — Telehealth: Payer: Self-pay

## 2021-11-23 ENCOUNTER — Other Ambulatory Visit: Payer: Self-pay

## 2021-11-23 ENCOUNTER — Other Ambulatory Visit: Payer: Medicare Other

## 2021-11-23 DIAGNOSIS — D3502 Benign neoplasm of left adrenal gland: Secondary | ICD-10-CM

## 2021-11-23 LAB — PARATHYROID HORMONE, INTACT (NO CA): PTH: 13 pg/mL — ABNORMAL LOW (ref 16–77)

## 2021-11-23 MED ORDER — GLIPIZIDE 10 MG PO TABS: 10.0000 mg | ORAL_TABLET | Freq: Two times a day (BID) | ORAL | 3 refills | Status: DC

## 2021-11-23 NOTE — Telephone Encounter (Signed)
Vm left for patient to call back.

## 2021-11-23 NOTE — Progress Notes (Unsigned)
Total volume 6050.  Started 11-21-2021 at 1:00 pm ended 11-22-2021 at 1:00 pm. ?

## 2021-11-23 NOTE — Telephone Encounter (Signed)
Patient states that she can't afford the Humalog pen and would will continue on the medications she is currently on. Does she needs to increase or adjust anything  ?

## 2021-11-24 NOTE — Telephone Encounter (Signed)
Vm left for patient to call back.

## 2021-11-25 ENCOUNTER — Encounter: Payer: Self-pay | Admitting: Internal Medicine

## 2021-11-26 LAB — COLOGUARD: COLOGUARD: NEGATIVE

## 2021-11-30 LAB — CALCIUM, URINE, 24 HOUR: Calcium, 24H Urine: 30 mg/24 h — ABNORMAL LOW

## 2021-11-30 LAB — CORTISOL, URINE, 24 HOUR
24 Hour urine volume (VMAHVA): 6050 mL
CREATININE, URINE: 1.36 g/(24.h) (ref 0.50–2.15)
Cortisol (Ur), Free: 10.6 mcg/24 h (ref 4.0–50.0)

## 2021-12-01 LAB — ALDOSTERONE + RENIN ACTIVITY W/ RATIO
ALDOS/RENIN RATIO: 1.2 (ref 0.0–30.0)
ALDOSTERONE: 1.5 ng/dL (ref 0.0–30.0)
Renin: 1.29 ng/mL/hr (ref 0.167–5.380)

## 2021-12-16 ENCOUNTER — Other Ambulatory Visit: Payer: Self-pay | Admitting: Internal Medicine

## 2021-12-16 ENCOUNTER — Other Ambulatory Visit: Payer: Self-pay | Admitting: Medical

## 2022-01-03 IMAGING — CT CT ABDOMEN W/O CM
1 of 2 series · 14 of 32 positions shown, 19 images · non-contrast
Comparison: Abdominal MRI dated 05/23/2018.

CLINICAL DATA: 63-year-old female with left adrenal adenoma.

EXAM:
CT ABDOMEN WITHOUT CONTRAST
TECHNIQUE: Multidetector CT imaging of the abdomen was performed following the
standard protocol without IV contrast.

[Series 2: adrenal w/(date) · axial · 0.84mm/px · z∈[-258,+27]mm · 14 of 107 slices shown, 19 images]
[im 6/107  soft-tissue]
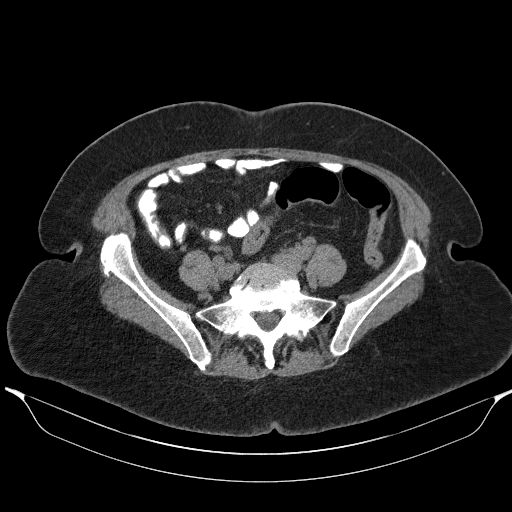
[im 6/107  bone]
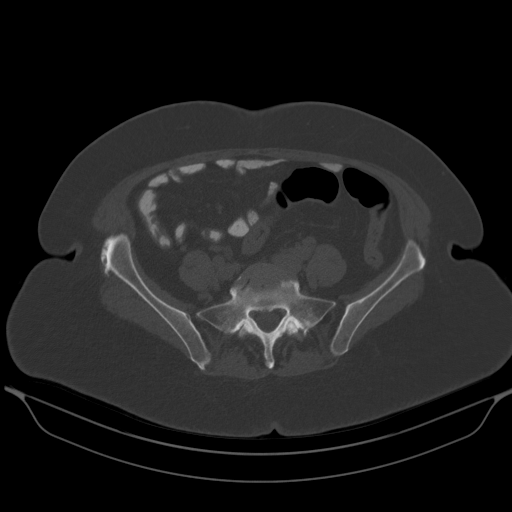
[im 17/107  soft-tissue]
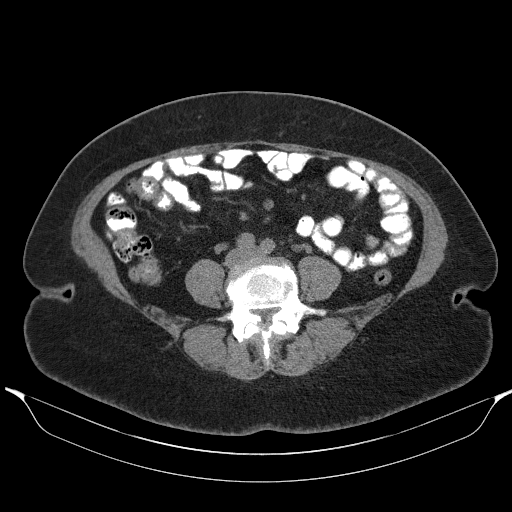
[im 23/107  soft-tissue]
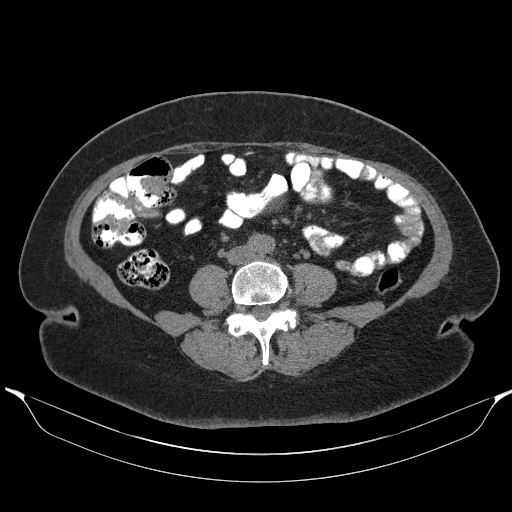
[im 28/107  soft-tissue]
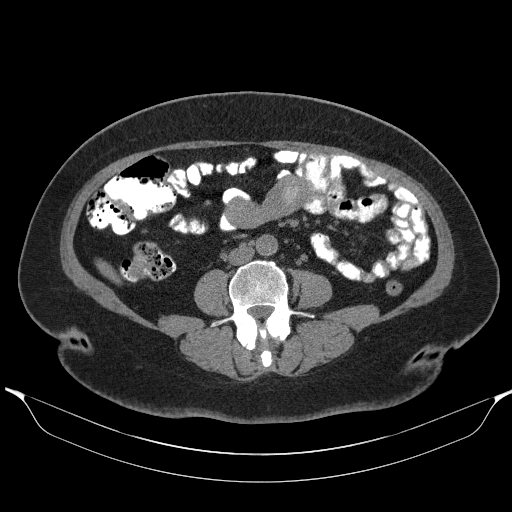
[im 40/107  soft-tissue]
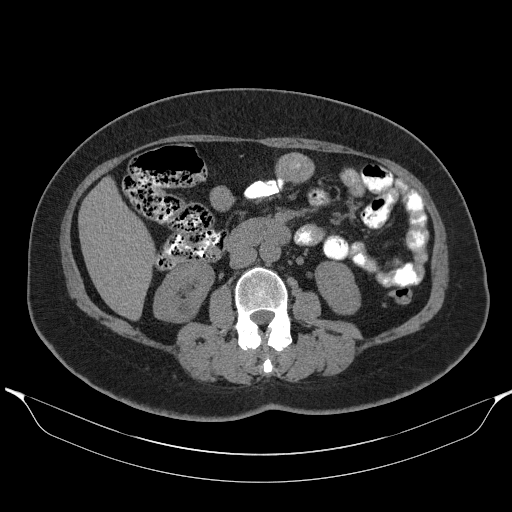
[im 45/107  soft-tissue]
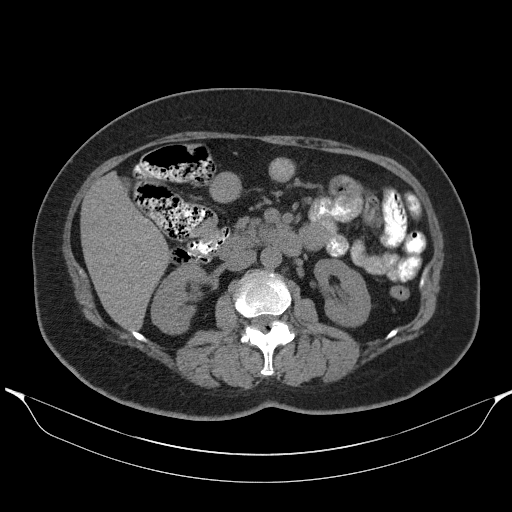
[im 56/107  soft-tissue]
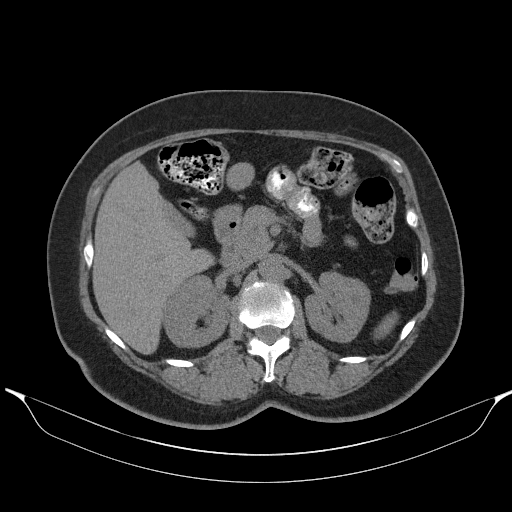
[im 62/107  soft-tissue]
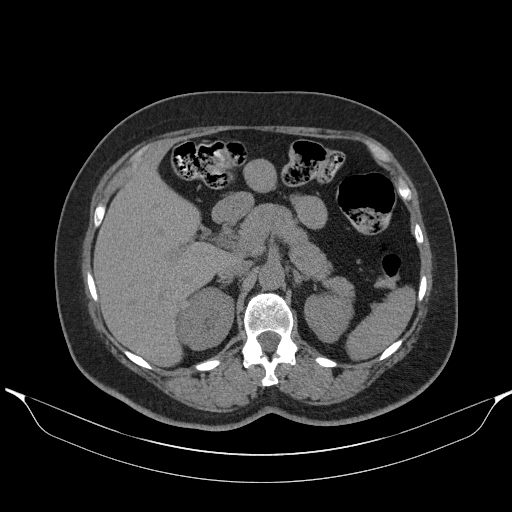
[im 67/107  soft-tissue]
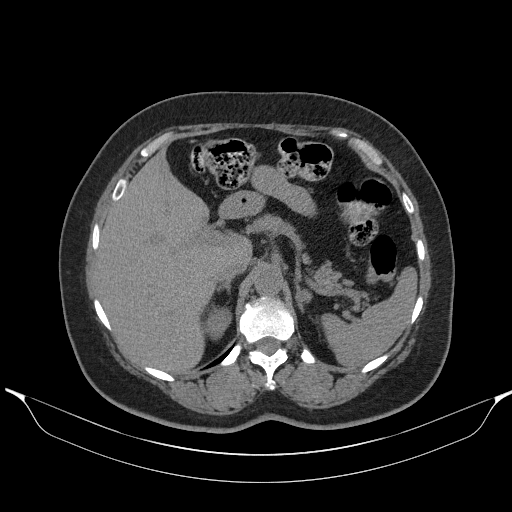
[im 67/107  bone]
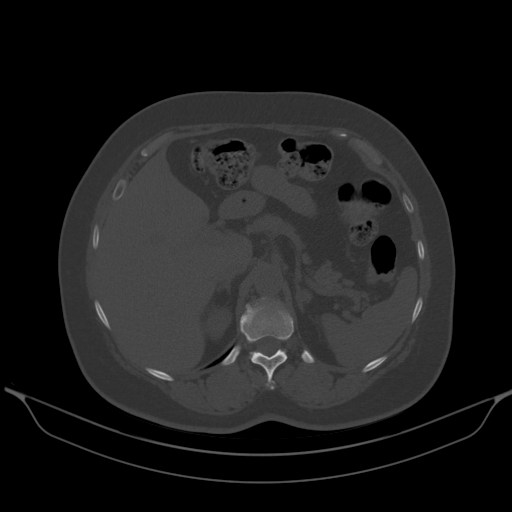
[im 79/107  soft-tissue]
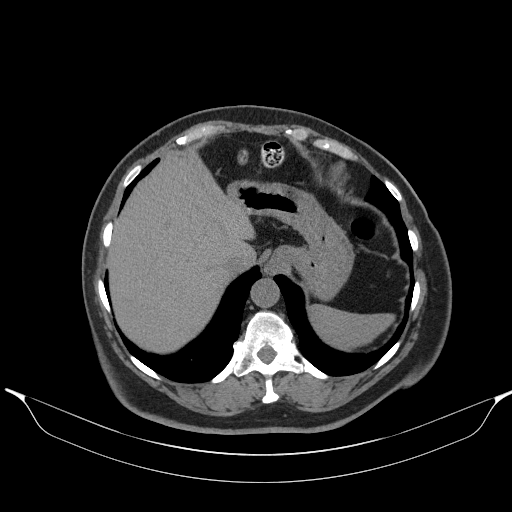
[im 84/107  soft-tissue]
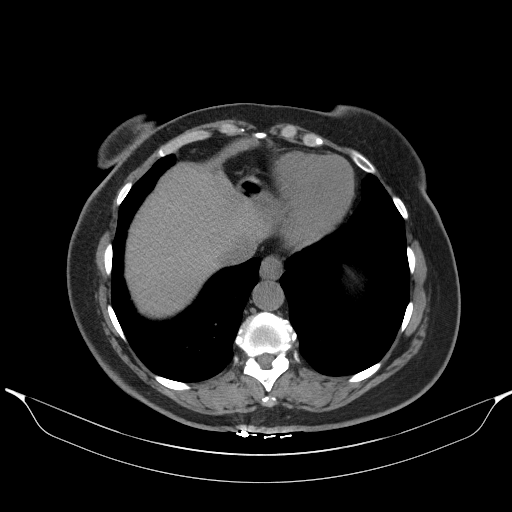
[im 84/107  lung]
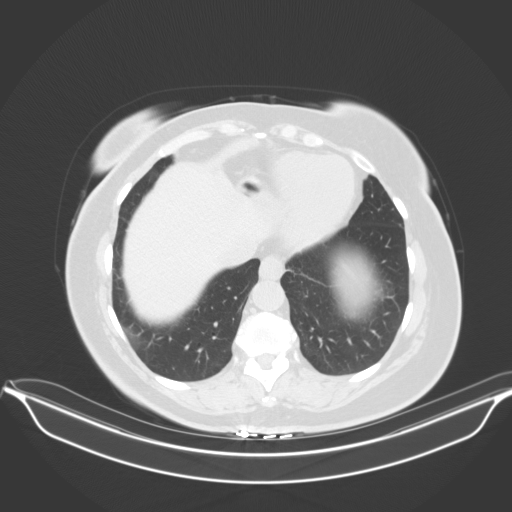
[im 90/107  soft-tissue]
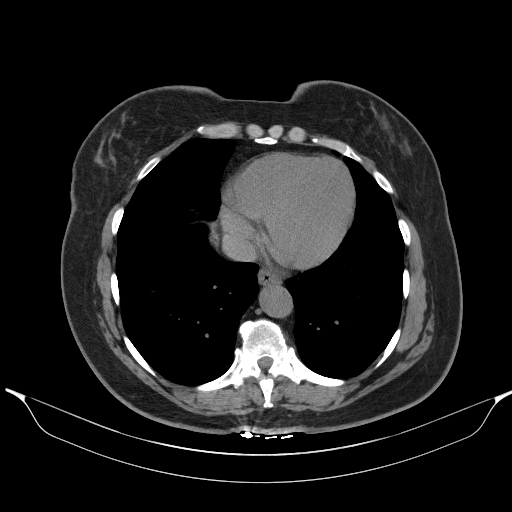
[im 90/107  lung]
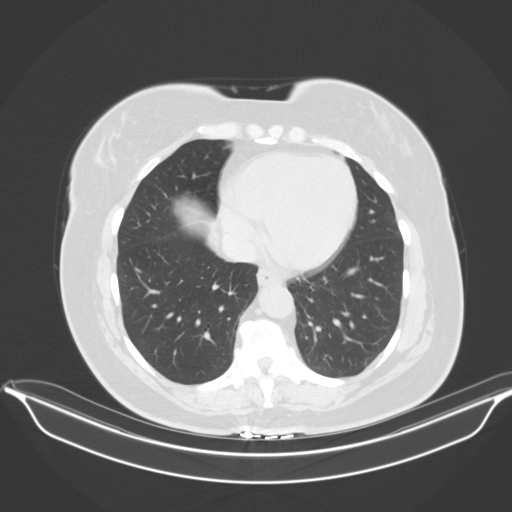
[im 95/107  lung]
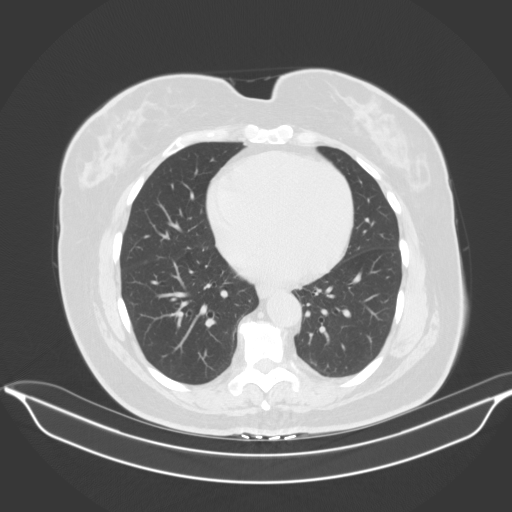
[im 101/107  soft-tissue]
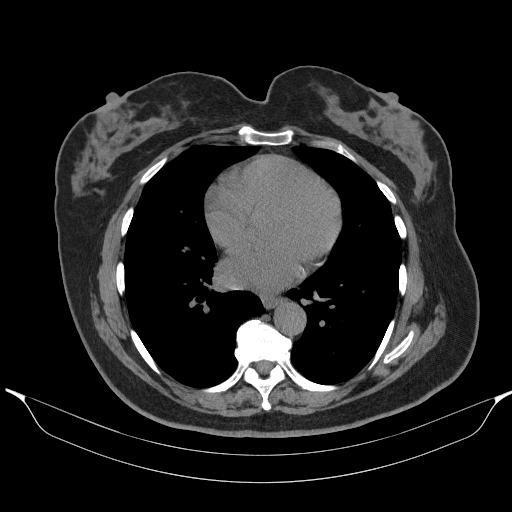
[im 101/107  lung]
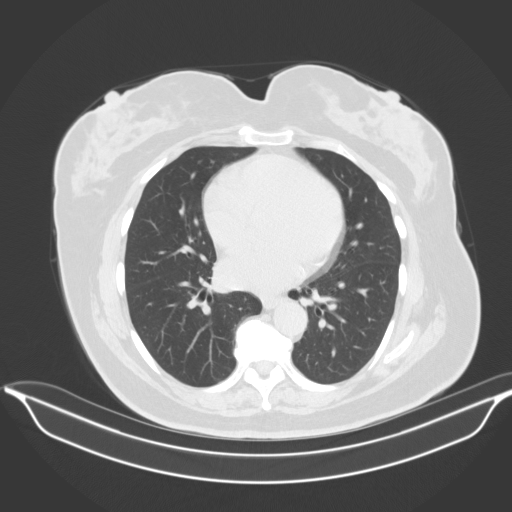

[14 of 32 positions shown; findings below may reference images not displayed]

FINDINGS: Evaluation of this exam is limited in the absence of intravenous
contrast.

Lower chest: The visualized lung bases are clear. Coronary vascular
calcification of the LAD.

No free air or free fluid identified within the visualized abdomen.

Hepatobiliary: No focal liver abnormality is seen. No gallstones,
gallbladder wall thickening, or biliary dilatation.

Pancreas: Unremarkable. No pancreatic ductal dilatation or
surrounding inflammatory changes.

Spleen: Normal in size without focal abnormality.

Adrenals/Urinary Tract: Indeterminate 1 cm left adrenal nodule. This
can be better characterized with MRI or dedicated adrenal protocol
CT. The right adrenal gland is unremarkable. There is no
hydronephrosis or nephrolithiasis on either side. Subcentimeter
bilateral renal hypodense and slightly higher attenuating lesions
are not characterized on this CT but may represent complex cysts.
These can be better evaluated with MRI or ultrasound. The visualized
ureters appear unremarkable.

Stomach/Bowel: There is no bowel obstruction or active inflammation.
The appendix is normal.

Vascular/Lymphatic: The abdominal aorta and IVC are grossly
unremarkable on this noncontrast CT. No portal venous gas. There is
no adenopathy.

Other: Partially visualized calcified soft tissue in the pelvis most
consistent with fibroids.

Musculoskeletal: Degenerative changes of the spine. No acute osseous
pathology. A 1 cm sclerotic focus in the left L1 vertebra is
indeterminate, possibly a bone island.
IMPRESSION: 1. Indeterminate 1 cm left adrenal nodule. This can be better
characterized with MRI or dedicated adrenal protocol CT.
2. No hydronephrosis or nephrolithiasis.
3. No bowel obstruction. Normal appendix.

## 2022-01-13 ENCOUNTER — Ambulatory Visit
Admission: EM | Admit: 2022-01-13 | Discharge: 2022-01-13 | Disposition: A | Discharge status: Home / Self Care | Payer: Medicare Other | Attending: Nurse Practitioner | Admitting: Nurse Practitioner

## 2022-01-13 ENCOUNTER — Encounter: Payer: Self-pay | Admitting: Emergency Medicine

## 2022-01-13 DIAGNOSIS — R739 Hyperglycemia, unspecified: Secondary | ICD-10-CM

## 2022-01-13 DIAGNOSIS — E1165 Type 2 diabetes mellitus with hyperglycemia: Secondary | ICD-10-CM

## 2022-01-13 LAB — POCT FASTING CBG KUC MANUAL ENTRY: POCT Glucose (KUC): 181 mg/dL — AB (ref 70–99)

## 2022-01-13 NOTE — Discharge Instructions (Addendum)
IfContinue to monitor your blood sugars at this time.  Your blood sugar was 181 in the office today. ?Monitor for elevated blood sugars that remain consistent in the 300s.  If they do, you will need to follow-up with your PCP.  They are higher than 450, you will need to go to the ER immediately. ?Continue to drink plenty of fluids and monitor your dietary intake.  Continue to monitor your blood sugars as previously prescribed. ?If you are having excessive thirst, excessive hunger, excessive urination, you will need to go see your PCP immediately. ?Follow-up with [facility] endocrinology as scheduled in June. ? ?

## 2022-01-13 NOTE — ED Triage Notes (Signed)
States diabetes medication is not working.  Blood sugar was 250 around 2pm.  States she is taking glipizide '10mg'$   twice a day.  States she cannot afford the insulin lispro medication her doctor prescribed for her   ?

## 2022-01-13 NOTE — ED Provider Notes (Signed)
?[place] ? ? ? ?CSN: [id number] ?Arrival date & time: 01/13/22  1624 ? ? ?  ? ?History   ?Chief Complaint ?Chief Complaint  ?Patient presents with  ? Hyperglycemia  ? ? ?HPI ?Kathy [name] is a 66 y.o. female.  ? ?The patient is a 66 year old female who presents for elevated blood sugars.  She states over the last several months, her blood sugar has been elevated.  She states that her primary care recently doubled her dose of glipizide.  She states since that time her blood sugars have been running in the 200s.  She states it normally runs between 120 and 140.  She was recently changed her recommended to take insulin, but she states she has not been able to start the insulin as her insurance does not cover it and she is unable to afford it.  She denies blurred vision, headache, nausea, vomiting, polydipsia, polyuria, or polyphagia.  She states that she has also been trying to watch what she eats and is drinking plenty of fluids because she also has kidney disease.  Patient states she has not been able to contact her PCP since her glipizide dose was doubled.  Patient states she is scheduled to follow-up with endocrinology in June. ? ?The history is provided by the patient.  ?Hyperglycemia ?Context: change in medication   ?Associated symptoms: no abdominal pain, no dehydration, no dizziness, no dysuria, no increased appetite, no increased thirst, no polyuria and no shortness of breath   ? ?Past Medical History:  ?Diagnosis Date  ? Atrial fibrillation ([place]) 05/2016  ? Diabetes mellitus without complication ([place])   ? type II  ? History of Helicobacter pylori infection   ? Hyperlipidemia   ? Hypertension   ? Microalbuminuria 2016  ? Obesity   ? ? ?Patient Active Problem List  ? Diagnosis Date Noted  ? Encounter for screening mammogram for malignant neoplasm of breast 04/14/2021  ? Need for shingles vaccine 04/14/2021  ? Wears dentures 03/03/2021  ? Vitamin D deficiency 03/03/2021  ? Hyperlipidemia  associated with type 2 diabetes mellitus ([place]) 03/03/2021  ? Coronary artery calcification 06/04/2020  ? Adrenal adenoma, left 02/01/2020  ? Type 2 diabetes mellitus with hyperglycemia, without long-term current use of insulin ([place]) 02/01/2020  ? Type 2 diabetes mellitus with stage 3b chronic kidney disease, without long-term current use of insulin ([place]) 07/26/2019  ? Left adrenal mass ([place]) 07/06/2019  ? Elevated blood uric acid level 02/26/2019  ? Gastroesophageal reflux disease 02/26/2019  ? Pain of foot 10/31/2018  ? Chronic kidney disease (CKD), stage III (moderate) ([place]) 06/20/2018  ? Lesion of adrenal gland ([place]) 06/20/2018  ? Renal cyst 06/20/2018  ? Uterine leiomyoma 06/20/2018  ? Callus of foot 12/19/2017  ? Constipation 11/18/2016  ? Insomnia 11/18/2016  ? Gum disease 11/18/2016  ? PAF (paroxysmal atrial fibrillation) ([place]) 07/19/2016  ? Encounter for health maintenance examination in adult 07/19/2016  ? Screen for colon cancer 07/19/2016  ? Screening for cervical cancer 07/19/2016  ? Post-menopausal 07/19/2016  ? Estrogen deficiency 07/19/2016  ? Vaccine counseling 07/19/2016  ? Need for influenza vaccination 05/24/2016  ? Diabetes mellitus with coincident hypertension ([place]) 05/24/2016  ? Type 2 diabetes mellitus with complication, without long-term current use of insulin ([place]) 08/06/2013  ? Essential hypertension, benign 08/06/2013  ? ? ?Past Surgical History:  ?Procedure Laterality Date  ? COLONOSCOPY    ? age 61yo  ? ? ?OB History   ?No obstetric history on file. ?  ? ? ? ?  Home Medications   ? ?Prior to Admission medications   ?Medication Sig Start Date End Date Taking? Authorizing Provider  ?allopurinol (ZYLOPRIM) 100 MG tablet Take 2 tablets (200 mg total) by mouth daily. 03/04/21   Tysinger, Camelia Eng, PA-C  ?amLODipine-olmesartan (AZOR) 10-40 MG tablet Take 1 tablet by mouth daily. 03/03/21   Tysinger, Camelia Eng, PA-C  ?atenolol (TENORMIN) 50 MG tablet Take 1 tablet (50 mg total) by mouth 2 (two) times  daily. 10/02/21   Strader, Fransisco Hertz, PA-C  ?blood glucose meter kit and supplies KIT Dispense based on patient and insurance preference. Use up to four times daily as directed. (FOR ICD-9 250.00, 250.01). 03/14/18   Tysinger, Camelia Eng, PA-C  ?ferrous sulfate 325 (65 FE) MG EC tablet Take 65 mg by mouth 3 (three) times daily with meals.    [provider]  ?glipiZIDE (GLUCOTROL) 10 MG tablet Take 1 tablet (10 mg total) by mouth 2 (two) times daily before a meal. 11/23/21   Shamleffer, Melanie Crazier, MD  ?glucose blood test strip Contour Next test strips. Use as instructed 05/10/19   Tysinger, Camelia Eng, PA-C  ?hydrALAZINE (APRESOLINE) 10 MG tablet TAKE 1 TABLET BY MOUTH TWICE DAILY 10/06/21   Werner Lean, MD  ?Multiple Vitamin (MULTIVITAMIN WITH MINERALS) TABS tablet Take 1 tablet by mouth daily.    [provider]  ?Rivaroxaban (XARELTO) 15 MG TABS tablet Take 1 tablet (15 mg total) by mouth daily with supper. 10/02/21   Strader, Fransisco Hertz, PA-C  ?rosuvastatin (CRESTOR) 20 MG tablet TAKE 1 TABLET(20 MG) BY MOUTH AT BEDTIME 12/16/21   Tysinger, Camelia Eng, PA-C  ?vitamin C (ASCORBIC ACID) 250 MG tablet Take 250 mg by mouth daily.    [provider]  ? ? ?Family History ?Family History  ?Problem Relation Age of Onset  ? Stroke Mother   ? Cancer Father   ?     throat  ? Diabetes Sister   ? Diabetes Sister   ? Diabetes Sister   ? Cancer Sister   ?     breast  ? Breast cancer Sister   ? Heart disease Neg Hx   ? ? ?Social History ?Social History  ? ?Tobacco Use  ? Smoking status: Never  ? Smokeless tobacco: Never  ?Vaping Use  ? Vaping Use: Never used  ?Substance Use Topics  ? Alcohol use: Not Currently  ?  Comment: rarely  ? Drug use: No  ? ? ? ?Allergies   ?Patient has no known allergies. ? ? ?Review of Systems ?Review of Systems  ?Constitutional: Negative.   ?Eyes: Negative.   ?Respiratory: Negative.  Negative for shortness of breath.   ?Cardiovascular: Negative.   ?Gastrointestinal:   Negative for abdominal pain.  ?Endocrine: Negative for polydipsia, polyphagia and polyuria.  ?Genitourinary:  Negative for dysuria.  ?Skin: Negative.   ?Neurological:  Negative for dizziness.  ?Psychiatric/Behavioral: Negative.    ? ? ?Physical Exam ?Triage Vital Signs ?ED Triage Vitals  ?Enc Vitals Group  ?   BP 01/13/22 1804 (!) 182/82  ?   Pulse Rate 01/13/22 1804 (!) 53  ?   Resp 01/13/22 1804 18  ?   Temp 01/13/22 1804 98.5 ?F (36.9 ?C)  ?   Temp Source 01/13/22 1804 Oral  ?   SpO2 01/13/22 1804 97 %  ?   Weight --   ?   Height --   ?   Head Circumference --   ?   Peak Flow --   ?  Pain Score 01/13/22 1808 0  ?   Pain Loc --   ?   Pain Edu? --   ?   Excl. in [place]? --   ? ?No data found. ? ?Updated Vital Signs ?BP (!) 182/82 (BP Location: Right Arm)   Pulse (!) 53   Temp 98.5 ?F (36.9 ?C) (Oral)   Resp 18   SpO2 97%  ? ?Visual Acuity ?Right Eye Distance:   ?Left Eye Distance:   ?Bilateral Distance:   ? ?Right Eye Near:   ?Left Eye Near:    ?Bilateral Near:    ? ?Physical Exam ?Vitals and nursing note reviewed.  ?Constitutional:   ?   General: She is not in acute distress. ?   Appearance: Normal appearance. She is well-developed.  ?HENT:  ?   Head: Normocephalic and atraumatic.  ?   Mouth/Throat:  ?   Mouth: Mucous membranes are moist.  ?Eyes:  ?   Extraocular Movements: Extraocular movements intact.  ?   Pupils: Pupils are equal, round, and reactive to light.  ?Cardiovascular:  ?   Rate and Rhythm: Normal rate and regular rhythm.  ?   Heart sounds: Normal heart sounds.  ?Pulmonary:  ?   Effort: Pulmonary effort is normal.  ?   Breath sounds: Normal breath sounds.  ?Abdominal:  ?   General: Bowel sounds are normal. There is no distension.  ?   Palpations: Abdomen is soft.  ?   Tenderness: There is no abdominal tenderness. There is no guarding or rebound.  ?Genitourinary: ?   Vagina: Normal. No vaginal discharge.  ?Musculoskeletal:  ?   Cervical back: Normal range of motion.  ?Skin: ?   General: Skin is warm  and dry.  ?   Capillary Refill: Capillary refill takes less than 2 seconds.  ?   Findings: No erythema or rash.  ?Neurological:  ?   General: No focal deficit present.  ?   Mental Status: She is alert and oriented t

## 2022-01-19 ENCOUNTER — Encounter: Payer: Self-pay | Admitting: Medical

## 2022-01-27 ENCOUNTER — Telehealth: Payer: Self-pay

## 2022-01-27 NOTE — Telephone Encounter (Signed)
Pt. Called wanting to let you know that her BS have been running high between 200-300 for a couple of months. She tried to call her diabetes doctor but has been unable to reach her. She wanted to let you know and wanted to know if you had any recommendations.  ?

## 2022-01-28 ENCOUNTER — Telehealth: Payer: Self-pay

## 2022-01-28 NOTE — Telephone Encounter (Signed)
Vm left for patient to callback and let us know sugar numbers and how she is taking her medication.

## 2022-02-18 ENCOUNTER — Other Ambulatory Visit: Payer: Self-pay | Admitting: Medical

## 2022-02-18 ENCOUNTER — Encounter: Payer: Self-pay | Admitting: Internal Medicine

## 2022-02-18 ENCOUNTER — Ambulatory Visit (INDEPENDENT_AMBULATORY_CARE_PROVIDER_SITE_OTHER): Payer: Medicare Other | Admitting: Internal Medicine

## 2022-02-18 VITALS — BP 120/78 | HR 60 | Ht 68.0 in | Wt 219.6 lb

## 2022-02-18 DIAGNOSIS — D3502 Benign neoplasm of left adrenal gland: Secondary | ICD-10-CM | POA: Diagnosis not present

## 2022-02-18 DIAGNOSIS — N1832 Chronic kidney disease, stage 3b: Secondary | ICD-10-CM

## 2022-02-18 DIAGNOSIS — E1122 Type 2 diabetes mellitus with diabetic chronic kidney disease: Secondary | ICD-10-CM | POA: Diagnosis not present

## 2022-02-18 DIAGNOSIS — E1165 Type 2 diabetes mellitus with hyperglycemia: Secondary | ICD-10-CM

## 2022-02-18 LAB — POCT GLYCOSYLATED HEMOGLOBIN (HGB A1C): Hemoglobin A1C: 10.3 % — AB (ref 4.0–5.6)

## 2022-02-18 MED ORDER — INSULIN PEN NEEDLE 32G X 4 MM MISC: 1.0000 | Freq: Two times a day (BID) | 3 refills | Status: DC

## 2022-02-18 MED ORDER — INSULIN LISPRO PROT & LISPRO (75-25 MIX) 100 UNIT/ML KWIKPEN: 14.0000 [IU] | PEN_INJECTOR | Freq: Two times a day (BID) | SUBCUTANEOUS | 3 refills | Status: DC

## 2022-02-18 NOTE — Progress Notes (Signed)
Name: Kathy [name]  Age/ Sex: 66 y.o., female   MRN/ DOB: [id number], 03/15/1956     PCP: Caryl Ada   Reason for Endocrinology Evaluation: Type 2 Diabetes Mellitus  Initial Endocrine Consultative Visit: 05/31/2019    PATIENT IDENTIFIER: Kathy [name] is a 66 y.o. female with a past medical history of T2Dm, HTN, A.Fib. The patient has followed with Endocrinology clinic since 05/31/2019 for consultative assistance with management of her diabetes.  DIABETIC HISTORY:  Kathy [name] was diagnosed with T2DM in 2001. [place] Glade, Glipizide (Stopped 02/2019), tradjenta, metfoRMIN . Rybelsus started 04/2019 but did not help so she stopped it and restarted metformin, in 05/2019 metformin was stopped again and was advised not to take it due to low GFR. Her hemoglobin A1c has ranged from 6.7% in 2015, peaking at 10.4% in 2020.  Ozempic started 01/2020  ADRENAL HISTORY: Pt was noted to have an incidental finding of a left adrenal adenoma 1.4 cm on MRI from 05/2018 during evaluation of a renal cysts. A CT scan of adrenal ( no contrast due to CKD and pt declined MRI 03/05/2020) showed an indeterminate left adrenal adenoma at 1 cm.    Labs were normal to include aldo, cortisol and metanephrines.   SUBJECTIVE:   During the last visit (11/20/2021): A1c 8.5% .  continued glipized    Today (02/18/2022): Kathy [name] is here for a follow up on diabetes management.  She checks her blood sugars 1-2 times daily, preprandial to breakfast and bedtime. The patient has not had hypoglycemic episodes since the last clinic visit.      She presented to the ED 01/13/2022  with Hyperglycemia   She has been noted with hypercalcemia in 09/2021 She was asked to avoid Vitamin D by nephrologist , she is not on calcium supplements.       HOME DIABETES REGIMEN:  Glipizide 10 mg, 1 tablet before Breakfast and 1 tablet before supper      GLUCOSE Meter: unable to download 14 day average 230 mg/dL     DIABETIC COMPLICATIONS: Microvascular complications:  CKDIII Denies: retinopathy , neuropathy  Last eye exam: scheduled 12/2021   Macrovascular complications:    Denies: CAD, PVD, CVA       HISTORY:  Past Medical History:  Past Medical History:  Diagnosis Date   Atrial fibrillation ([place]) 05/2016   Diabetes mellitus without complication ([place])    type II   History of Helicobacter pylori infection    Hyperlipidemia    Hypertension    Microalbuminuria 2016   Obesity    Past Surgical History:  Past Surgical History:  Procedure Laterality Date   COLONOSCOPY     age 70yo   Social History:  reports that she has never smoked. She has never used smokeless tobacco. She reports that she does not currently use alcohol. She reports that she does not use drugs. Family History:  Family History  Problem Relation Age of Onset   Stroke Mother    Cancer Father        throat   Diabetes Sister    Diabetes Sister    Diabetes Sister    Cancer Sister        breast   Breast cancer Sister    Heart disease Neg Hx      HOME MEDICATIONS: Allergies as of 02/18/2022   No Known Allergies      Medication List        Accurate as of February 18, 2022  10:07 AM. If you have any questions, ask your nurse or doctor.          allopurinol 100 MG tablet Commonly known as: ZYLOPRIM Take 2 tablets (200 mg total) by mouth daily.   amLODipine-olmesartan 10-40 MG tablet Commonly known as: AZOR Take 1 tablet by mouth daily.   atenolol 50 MG tablet Commonly known as: TENORMIN Take 1 tablet (50 mg total) by mouth 2 (two) times daily.   blood glucose meter kit and supplies Kit Dispense based on patient and insurance preference. Use up to four times daily as directed. (FOR ICD-9 250.00, 250.01).   ferrous sulfate 325 (65 FE) MG EC tablet Take 65 mg by mouth 3 (three) times daily with meals.   glipiZIDE 10 MG tablet Commonly known as: GLUCOTROL Take 1 tablet (10 mg total) by mouth 2  (two) times daily before a meal.   glucose blood test strip Contour Next test strips. Use as instructed   hydrALAZINE 10 MG tablet Commonly known as: APRESOLINE TAKE 1 TABLET BY MOUTH TWICE DAILY   multivitamin with minerals Tabs tablet Take 1 tablet by mouth daily.   Rivaroxaban 15 MG Tabs tablet Commonly known as: XARELTO Take 1 tablet (15 mg total) by mouth daily with supper.   rosuvastatin 20 MG tablet Commonly known as: CRESTOR TAKE 1 TABLET(20 MG) BY MOUTH AT BEDTIME   vitamin C 250 MG tablet Commonly known as: ASCORBIC ACID Take 250 mg by mouth daily.         OBJECTIVE:   Vital Signs:BP 120/78 (BP Location: Left Arm, Patient Position: Sitting, Cuff Size: Small)   Pulse 60   Ht _0  (1.727 m)   Wt 219 lb 9.6 oz (99.6 kg)   SpO2 95%   BMI 33.39 kg/m   Wt Readings from Last 3 Encounters:  02/18/22 219 lb 9.6 oz (99.6 kg)  11/20/21 215 lb (97.5 kg)  10/02/21 208 lb 12.8 oz (94.7 kg)     Exam: General: Pt appears well and is in NAD  Lungs: Clear with good BS bilat with no rales, rhonchi, or wheezes  Heart: RRR with normal S1 and S2 and no gallops; no murmurs; no rub  Abdomen: Normoactive bowel sounds, soft, nontender, without masses or organomegaly palpable  Extremities: No pretibial edema.  Skin: Normal texture and temperature to palpation. No rash noted. No Acanthosis nigricans/skin tags. No lipohypertrophy.  Neuro: MS is good with appropriate affect, pt is alert and Ox3      DM foot exam: 09/18/2020   The skin of the feet is intact without sores or ulcerations. The pedal pulses are 2+ on right and 2+ on left. The sensation is intact to a screening 5.07, 10 gram monofilament bilaterally    DATA REVIEWED:  Lab Results  Component Value Date   HGBA1C 10.3 (A) 02/18/2022   HGBA1C 8.5 (A) 11/20/2021   HGBA1C 6.9 (A) 07/23/2021    Latest Reference Range & Units 11/20/21 08:22  Sodium 135 - 145 mEq/L 134 (L)  Potassium 3.5 - 5.1 mEq/L 5.1   Chloride 96 - 112 mEq/L 101  CO2 19 - 32 mEq/L 24  Glucose 70 - 99 mg/dL 201 (H)  BUN 6 - 23 mg/dL 58 (H)  Creatinine 0.40 - 1.20 mg/dL 2.08 (H)  Calcium 8.4 - 10.5 mg/dL 10.3    Latest Reference Range & Units 11/20/21 08:22  GFR >60.00 mL/min 24.55 (L)    Latest Reference Range & Units 11/20/21 08:22  VITD 30.00 - 100.00  ng/mL 36.49      Latest Reference Range & Units 11/20/21 08:22  ALDOSTERONE 0.0 - 30.0 ng/dL 1.5  Renin 0.167 - 5.380 ng/mL/hr 1.290  ALDOS/RENIN RATIO 0.0 - 30.0  1.2    Latest Reference Range & Units 11/23/21 08:42  Cortisol (Ur), Free 4.0 - 50.0 mcg/24 h 10.6   Results for MALIYAH, WILLETS (MRN [id number]) as of 07/23/2021 10:26  Ref. Range 03/23/2021 11:13  Dopamine 24 Hr Urine Latest Ref Range: 0 - 510 ug/24 hr 71  Dopamine, Rand Ur Latest Ref Range: Undefined ug/L 23  Epinephrine 24 Hr Urine Latest Ref Range: 0 - 20 ug/24 hr 3  Epinephrine, Rand Ur Latest Ref Range: Undefined ug/L 1  Norepinephrine, Rand Ur Latest Ref Range: Undefined ug/L 13  Norepinephrine, 24H Ur Latest Ref Range: 0 - 135 ug/24 hr 40  VMA, Urine Latest Ref Range: Undefined mg/L 0.5  VMA, 24H Ur Adult Latest Ref Range: 0.0 - 7.5 mg/24 hr 1.5    CT scan 09/25/2021  Adrenals/Urinary Tract: Stable 1.2 cm left adrenal nodule. No right adrenal nodule is seen.   ASSESSMENT / PLAN / RECOMMENDATIONS:   1) Type 2 Diabetes Mellitus, Poorly controlled, With CKD III complications - Most recent A1c of 10.3 %. Goal A1c < 7.0 %.    -Patient continues with hyperglycemia despite escalating glipizide, Humalog in the past has been cost prohibitive - Declines GLP-1 agonists as she attributes dehydration and low GFR to them  - Declines CGM technology  -We have again discussed the importance of switching to insulin given low GFR and worsening glycemic control, I am going to start her on insulin mix to be taken twice daily.  She was also provided with patient assistance papers for [facility] care in  case the cost is high for her, then she will complete this     MEDICATIONS:  -Stop glipizide 10 mg BID  -Start Humalog mix 14 units twice daily      EDUCATION / INSTRUCTIONS: BG monitoring instructions: Patient is instructed to check her blood sugars 3 times a day, before meals  Call [facility] if: BG persistently < 70 I reviewed the Rule of 15 for the treatment of hypoglycemia in detail with the patient. Literature supplied.  2. Left Adrenal Adenoma :   - Stable imaging (09/2021) - No clinical or biochemical evidence of extra hormonal excretion ( labs 11/2021)    3. Hypercalcemia :   - Hypercalcemia resolved with discontinuation of  calcium supplements    F/U in 4 months    Signed electronically by: Mack Guise, MD  [facility]  [facility] [address] [place], [place] [address] Phone: [phone] FAX: [phone]   CC: Carlena Hurl, PA-C [place] [address] Phone: [phone]  Fax: [phone]  Return to Endocrinology clinic as below: Future Appointments  Date Time Provider [place]  03/31/2022  2:00 PM Strader, Fransisco Hertz, PA-C [facility] [place] [facility]

## 2022-02-18 NOTE — Telephone Encounter (Signed)
Left message for pt to call back. Once scheduled, we can refill for 30 days

## 2022-02-18 NOTE — Patient Instructions (Signed)
STOP Glipizide  Start Humalog MIX 14 units with Breakfast and 14 units with Supper    HOW TO TREAT LOW BLOOD SUGARS (Blood sugar LESS THAN 70 MG/DL) Please follow the RULE OF 15 for the treatment of hypoglycemia treatment (when your (blood sugars are less than 70 mg/dL)   STEP 1: Take 15 grams of carbohydrates when your blood sugar is low, which includes:  3-4 GLUCOSE TABS  OR 3-4 OZ OF JUICE OR REGULAR SODA OR ONE TUBE OF GLUCOSE GEL    STEP 2: RECHECK blood sugar in 15 MINUTES STEP 3: If your blood sugar is still low at the 15 minute recheck --> then, go back to STEP 1 and treat AGAIN with another 15 grams of carbohydrates.

## 2022-02-23 ENCOUNTER — Encounter: Payer: Self-pay | Admitting: Medical

## 2022-02-23 ENCOUNTER — Ambulatory Visit (INDEPENDENT_AMBULATORY_CARE_PROVIDER_SITE_OTHER): Payer: Medicare Other | Admitting: Medical

## 2022-02-23 VITALS — BP 130/80 | HR 57 | Wt 219.0 lb

## 2022-02-23 DIAGNOSIS — N939 Abnormal uterine and vaginal bleeding, unspecified: Secondary | ICD-10-CM

## 2022-02-23 DIAGNOSIS — E785 Hyperlipidemia, unspecified: Secondary | ICD-10-CM

## 2022-02-23 DIAGNOSIS — E559 Vitamin D deficiency, unspecified: Secondary | ICD-10-CM | POA: Diagnosis not present

## 2022-02-23 DIAGNOSIS — E1122 Type 2 diabetes mellitus with diabetic chronic kidney disease: Secondary | ICD-10-CM | POA: Diagnosis not present

## 2022-02-23 DIAGNOSIS — N1832 Chronic kidney disease, stage 3b: Secondary | ICD-10-CM

## 2022-02-23 DIAGNOSIS — I480 Paroxysmal atrial fibrillation: Secondary | ICD-10-CM

## 2022-02-23 DIAGNOSIS — D259 Leiomyoma of uterus, unspecified: Secondary | ICD-10-CM

## 2022-02-23 DIAGNOSIS — E790 Hyperuricemia without signs of inflammatory arthritis and tophaceous disease: Secondary | ICD-10-CM

## 2022-02-23 DIAGNOSIS — E1169 Type 2 diabetes mellitus with other specified complication: Secondary | ICD-10-CM | POA: Diagnosis not present

## 2022-02-23 DIAGNOSIS — T50905A Adverse effect of unspecified drugs, medicaments and biological substances, initial encounter: Secondary | ICD-10-CM | POA: Insufficient documentation

## 2022-02-23 DIAGNOSIS — I10 Essential (primary) hypertension: Secondary | ICD-10-CM

## 2022-02-23 DIAGNOSIS — Z1231 Encounter for screening mammogram for malignant neoplasm of breast: Secondary | ICD-10-CM

## 2022-02-23 MED ORDER — AMLODIPINE-OLMESARTAN 10-40 MG PO TABS: 1.0000 | ORAL_TABLET | Freq: Every day | ORAL | 3 refills | Status: DC

## 2022-02-23 MED ORDER — ALLOPURINOL 100 MG PO TABS: 200.0000 mg | ORAL_TABLET | Freq: Every day | ORAL | 3 refills | Status: DC

## 2022-02-23 MED ORDER — ROSUVASTATIN CALCIUM 20 MG PO TABS: 20.0000 mg | ORAL_TABLET | Freq: Every day | ORAL | 3 refills | Status: DC

## 2022-02-23 MED ORDER — ASPIRIN 325 MG PO TBEC: 325.0000 mg | DELAYED_RELEASE_TABLET | Freq: Every day | ORAL | 0 refills | Status: DC

## 2022-02-23 NOTE — Progress Notes (Signed)
Subjective:  Kathy [name] is a 66 y.o. female who presents for Chief Complaint  Patient presents with   Follow-up    Follow up on BP. No other concerns     Patient Care Team: Eliezer Khawaja, Camelia Eng, PA-C as PCP - General (Family Medicine) Werner Lean, MD as PCP - Cardiology (Cardiology) Roderic Palau, NP, [place]nerd Pho, [place], with Afib clinic Dr. Collier Flowers, endocrinology Dr. Donato Heinz, nephrology gynecology  Concerns: Here for med check.  High blood pressure-compliant with medication, home blood pressures have been looking good.  Diabetes-she has some recent medication changes.  She sees endocrinology  High cholesterol-compliant with statin without complaint  History of paroxysmal atrial fibrillation-she was on Xarelto for the last few months but says when she started that she started having uterine bleeding.  She has a history of fibroids.  She ended up stopping Xarelto several weeks ago.  She spoke to her father's about this but did not call cardiology or [place].  She sees A-fib clinic this month, sees kidney doctor next month.  No other aggravating or relieving factors.    No other c/o.  Past Medical History:  Diagnosis Date   Atrial fibrillation ([place]) 05/2016   Diabetes mellitus without complication ([place])    type II   History of Helicobacter pylori infection    Hyperlipidemia    Hypertension    Microalbuminuria 2016   Obesity    Current Outpatient Medications on File Prior to Visit  Medication Sig Dispense Refill   atenolol (TENORMIN) 50 MG tablet Take 1 tablet (50 mg total) by mouth 2 (two) times daily. 180 tablet 2   blood glucose meter kit and supplies KIT Dispense based on patient and insurance preference. Use up to four times daily as directed. (FOR ICD-9 250.00, 250.01). 1 each 0   ferrous sulfate 325 (65 FE) MG EC tablet Take 65 mg by mouth 3 (three) times daily with meals.     glucose blood test strip Contour Next test strips.  Use as instructed 100 each 6   hydrALAZINE (APRESOLINE) 10 MG tablet TAKE 1 TABLET BY MOUTH TWICE DAILY 180 tablet 3   Insulin Lispro Prot & Lispro (HUMALOG MIX 75/25 KWIKPEN) (75-25) 100 UNIT/ML Kwikpen Inject 14 Units into the skin 2 (two) times daily with a meal. 30 mL 3   Insulin Pen Needle 32G X 4 MM MISC 1 Device by Does not apply route in the morning and at bedtime. 200 each 3   Multiple Vitamin (MULTIVITAMIN WITH MINERALS) TABS tablet Take 1 tablet by mouth daily.     vitamin C (ASCORBIC ACID) 250 MG tablet Take 250 mg by mouth daily.     No current facility-administered medications on file prior to visit.     The following portions of the patient's history were reviewed and updated as appropriate: allergies, current medications, past family history, past medical history, past social history, past surgical history and problem list.  ROS Otherwise as in subjective above    Objective: BP 130/80   Pulse (!) 57   Wt 219 lb (99.3 kg)   SpO2 99%   BMI 33.30 kg/m   Wt Readings from Last 3 Encounters:  02/23/22 219 lb (99.3 kg)  02/18/22 219 lb 9.6 oz (99.6 kg)  11/20/21 215 lb (97.5 kg)   BP Readings from Last 3 Encounters:  02/23/22 130/80  02/18/22 120/78  01/13/22 (!) 182/82    General appearance: alert, no distress, well developed, well nourished Neck: supple, no  lymphadenopathy, no thyromegaly, no masses Heart: RRR, normal S1, S2, no murmurs Lungs: CTA bilaterally, no wheezes, rhonchi, or rales Pulses: 2+ radial pulses, 2+ pedal pulses, normal cap refill Ext: no edema   Assessment: Encounter Diagnoses  Name Primary?   Type 2 diabetes mellitus with stage 3b chronic kidney disease, without long-term current use of insulin (HCC) Yes   Vitamin D deficiency    Uterine leiomyoma, unspecified location    Hyperlipidemia associated with type 2 diabetes mellitus ([place])    Essential hypertension, benign    Stage 3b chronic kidney disease (HCC)    PAF (paroxysmal atrial  fibrillation) (HCC)    Uterine bleeding    Adverse effect of drug, initial encounter    Encounter for screening mammogram for malignant neoplasm of breast    Elevated uric acid in blood      Plan: Diabetes with stage III CKD, uncontrolled glucose-follow-up with endocrinology as planned.  Continue current medication glucose monitoring  Hyperlipidemia-nonfasting today.  Return soon for fasting labs.  Last lipid panel was a year ago.  Continue current statin  PAF-she recently stopped Xarelto on her own due to uterine bleeding.  I recommended she follow-up with gynecology for evaluation of uterine bleeding just to rule out other issues.  She does have a history of fibroids.  I reviewed a recent CT abdomen pelvis she had been in January of this year showing multiple uterine fibroids.  For now she will start aspirin.  We discussed other options for anticoagulation but she declined Xarelto or Eliquis currently  Hypertension-continue current therapy, follow-up with nephrology next month as planned.  As of her April 2023 visit with nephrology the medications remained unchanged with what she is taking now.  Vitamin D deficiency-continue supplement.  Screening for breast cancer-she is past due.  She states that she plans to get a mammogram closer to December.  [name] was seen today for follow-up.  Diagnoses and all orders for this visit:  Type 2 diabetes mellitus with stage 3b chronic kidney disease, without long-term current use of insulin (HCC)  Vitamin D deficiency  Uterine leiomyoma, unspecified location  Hyperlipidemia associated with type 2 diabetes mellitus ([place]) -     Lipid panel; Future  Essential hypertension, benign  Stage 3b chronic kidney disease (HCC)  PAF (paroxysmal atrial fibrillation) (HCC)  Uterine bleeding  Adverse effect of drug, initial encounter  Encounter for screening mammogram for malignant neoplasm of breast  Elevated uric acid in blood -     Uric  acid; Future  Other orders -     aspirin EC 325 MG tablet; Take 1 tablet (325 mg total) by mouth daily. -     allopurinol (ZYLOPRIM) 100 MG tablet; Take 2 tablets (200 mg total) by mouth daily. -     amLODipine-olmesartan (AZOR) 10-40 MG tablet; Take 1 tablet by mouth daily. -     rosuvastatin (CRESTOR) 20 MG tablet; Take 1 tablet (20 mg total) by mouth daily.    Follow up: fasting for lab

## 2022-02-23 NOTE — Patient Instructions (Signed)
Diabetes with stage III CKD, uncontrolled glucose-follow-up with endocrinology as planned.  Continue current medication glucose monitoring  Hyperlipidemia-nonfasting today.  Return soon for fasting labs.  Last lipid panel was a year ago.  Continue current statin  PAF-she recently stopped Xarelto on her own due to uterine bleeding.  I recommended she follow-up with gynecology for evaluation of uterine bleeding just to rule out other issues.  She does have a history of fibroids.  I reviewed a recent CT abdomen pelvis she had been in January of this year showing multiple uterine fibroids.  For now she will start aspirin.  We discussed other options for anticoagulation but she declined Xarelto or Eliquis currently  Hypertension-continue current therapy, follow-up with nephrology next month as planned.  As of her April 2023 visit with nephrology the medications remained unchanged with what she is taking now.  Vitamin D deficiency-continue supplement.  Screening for breast cancer-she is past due.  She states that she plans to get a mammogram closer to December.

## 2022-03-09 ENCOUNTER — Other Ambulatory Visit: Payer: Self-pay | Admitting: Medical

## 2022-03-09 DIAGNOSIS — Z1231 Encounter for screening mammogram for malignant neoplasm of breast: Secondary | ICD-10-CM

## 2022-03-31 ENCOUNTER — Ambulatory Visit (INDEPENDENT_AMBULATORY_CARE_PROVIDER_SITE_OTHER): Payer: Medicare Other | Admitting: Student

## 2022-03-31 ENCOUNTER — Encounter: Payer: Self-pay | Admitting: Student

## 2022-03-31 VITALS — BP 138/82 | HR 53 | Ht 68.0 in | Wt 225.8 lb

## 2022-03-31 DIAGNOSIS — N184 Chronic kidney disease, stage 4 (severe): Secondary | ICD-10-CM

## 2022-03-31 DIAGNOSIS — I10 Essential (primary) hypertension: Secondary | ICD-10-CM | POA: Diagnosis not present

## 2022-03-31 DIAGNOSIS — I480 Paroxysmal atrial fibrillation: Secondary | ICD-10-CM

## 2022-03-31 DIAGNOSIS — E785 Hyperlipidemia, unspecified: Secondary | ICD-10-CM | POA: Diagnosis not present

## 2022-03-31 MED ORDER — APIXABAN 5 MG PO TABS
5.0000 mg | ORAL_TABLET | Freq: Two times a day (BID) | ORAL | 6 refills | Status: DC
Start: 2022-03-31 — End: 2023-01-24

## 2022-03-31 NOTE — Patient Instructions (Signed)
Medication Instructions:   START Eliquis 5 mg twice a day   STOP Aspirin  Complete the patient assistance forms, bring copies of income to office.    30 Day free Trial card given today   Labwork: None today  Testing/Procedures: None today  Follow-Up: 6 months  Any Other Special Instructions Will Be Listed Below (If Applicable).  If you need a refill on your cardiac medications before your next appointment, please call your pharmacy.

## 2022-03-31 NOTE — Progress Notes (Signed)
Cardiology Office Note    Date:  03/31/2022   ID:  Kathy [name], DOB 12/24/1955, MRN [id number]  PCP:  Carlena Hurl, PA-C  Cardiologist: Werner Lean, MD    Chief Complaint  Patient presents with   Follow-up    6 month visit    History of Present Illness:    Kathy [name] is a 66 y.o. female with past medical history of paroxysmal atrial fibrillation (diagnosed in 05/2016 with no recurrence by event monitor in 06/2020, documented recurrence in 09/2021), HTN, HLD, Type 2 DM, Stage 4 CKD and adrenal adenoma who presents to the office today for 27-monthfollow-up.  She was examined by myself in 09/2021 following a recent Emergency Department visit for evaluation of weakness and vomiting. Was found to be in atrial fibrillation with RVR at that time and was started on Xarelto for anticoagulation. At the time of her follow-up visit, she reported occasional palpitations but they would spontaneously resolve. She was in normal sinus rhythm by repeat EKG and was continued on Atenolol 50 mg twice daily along with Xarelto 15 mg daily.  In talking with the patient today, she reports still having occasional palpitations but says they spontaneously resolve. She does have a blood pressure cuff at home but does not check her readings when this occurs.  She denies any recent chest pain or dyspnea on exertion. No recent orthopnea, PND or pitting edema.  She did quit taking Xarelto in the interim as she had vaginal bleeding with this and switched to ASA 81 mg daily.   Past Medical History:  Diagnosis Date   [place]rial [place] ([facility] 05/[place]   Diabetes mellitus without complication ([facility]    type II   History of Helicobacter pylori infection    Hyperlipidemia    Hypertension    Microalbuminuria 2016   Obesity     Past Surgical History:  Procedure Laterality Date   COLONOSCOPY     age 66yo   Current Medications: Outpatient Medications Prior to Visit  Medication Sig  Dispense Refill   allopurinol (ZYLOPRIM) 100 MG tablet Take 2 tablets (200 mg total) by mouth daily. 180 tablet 3   amLODipine-olmesartan (AZOR) 10-40 MG tablet Take 1 tablet by mouth daily. 90 tablet 3   atenolol (TENORMIN) 50 MG tablet Take 1 tablet (50 mg total) by mouth 2 (two) times daily. 180 tablet 2   blood glucose meter kit and supplies KIT Dispense based on patient and insurance preference. Use up to four times daily as directed. (FOR ICD-9 250.00, 250.01). 1 each 0   ferrous sulfate 325 (65 FE) MG EC tablet Take 65 mg by mouth 3 (three) times daily with meals.     glucose blood test strip Contour Next test strips. Use as instructed 100 each 6   hydrALAZINE (APRESOLINE) 10 MG tablet TAKE 1 TABLET BY MOUTH TWICE DAILY 180 tablet 3   Insulin Lispro Prot & Lispro (HUMALOG MIX 75/25 KWIKPEN) (75-25) 100 UNIT/ML Kwikpen Inject 14 Units into the skin 2 (two) times daily with a meal. 30 mL 3   Insulin Pen Needle 32G X 4 MM MISC 1 Device by Does not apply route in the morning and at bedtime. 200 each 3   Multiple Vitamin (MULTIVITAMIN WITH MINERALS) TABS tablet Take 1 tablet by mouth daily.     rosuvastatin (CRESTOR) 20 MG tablet Take 1 tablet (20 mg total) by mouth daily. 90 tablet 3   vitamin C (ASCORBIC ACID) 250 MG tablet  Take 250 mg by mouth daily.     aspirin EC 81 MG tablet Take 81 mg by mouth daily. Swallow whole.     aspirin EC 325 MG tablet Take 1 tablet (325 mg total) by mouth daily. 90 tablet 0   No facility-administered medications prior to visit.     Allergies:   Xarelto [rivaroxaban]   Social History   Socioeconomic History   Marital status: Married    Spouse name: Not on file   Number of children: Not on file   Years of education: Not on file   Highest education level: Not on file  Occupational History   Not on file  Tobacco Use   Smoking status: Never   Smokeless tobacco: Never  Vaping Use   Vaping Use: Never used  Substance and Sexual Activity   Alcohol use:  Not Currently    Comment: rarely   Drug use: No   Sexual activity: Not on file  Other Topics Concern   Not on file  Social History Narrative   Lives with husband and son.   Retired.    Does housework, cooks, clean, busy.  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   Review of Systems:    Please see the history of present illness.     All other systems reviewed and are otherwise negative except as noted above.   Physical Exam:    VS:  BP 138/82   Pulse (!) 53   Ht _0  (1.727 m)   Wt 225 lb 12.8 oz (102.4 kg)   SpO2 98%   BMI 34.33 kg/m    General: Well developed, well nourished,female appearing in no acute distress. Head: Normocephalic, atraumatic. Neck: No carotid bruits. JVD not elevated.  Lungs: Respirations regular and unlabored, without wheezes or rales.  Heart: Regular rate and rhythm. No S3 or S4.  No murmur, no rubs, or gallops appreciated. Abdomen: Appears non-distended. No obvious abdominal masses. Msk:  Strength and tone appear normal for age. No obvious joint deformities or effusions. Extremities: No clubbing or cyanosis. No pitting edema.  Distal pedal pulses are 2+ bilaterally. Neuro: Alert and oriented X 3. Moves all extremities spontaneously. No focal deficits noted. Psych:  Responds to questions appropriately with a normal affect. Skin: No rashes or lesions noted  Wt Readings from Last 3 Encounters:  03/31/22 225 lb 12.8 oz (102.4 kg)  02/23/22 219 lb (99.3 kg)  02/18/22 219 lb 9.6 oz (99.6 kg)     Studies/Labs Reviewed:   EKG:  EKG is not ordered today.    Recent Labs: 09/25/2021: ALT 21; Hemoglobin 14.2; Platelets 253 11/20/2021: BUN 58; Creatinine, Ser 2.08; Potassium 5.1; Sodium 134   Lipid Panel    Component Value Date/Time   CHOL 159 03/03/2021 0921   TRIG 99 03/03/2021 0921   HDL 78 03/03/2021 0921   CHOLHDL 2.0 03/03/2021 0921   CHOLHDL 2.1 05/24/2016 1042   VLDL 15 05/24/2016 1042   LDLCALC 63 03/03/2021 0921    Additional studies/ records that were reviewed today include:  Echocardiogram: 05/2016 Study Conclusions   - Left ventricle: The cavity size was normal. There was mild    concentric hypertrophy. Systolic function was normal. The    estimated ejection fraction was in the range of 55% to 60%. Wall    motion was normal; there were no regional wall motion    abnormalities. Left ventricular diastolic function parameters    were normal.  - Aortic valve: Trileaflet; normal thickness leaflets.  - Aortic root: The aortic root was normal in size.  - Mitral valve: Structurally normal valve. There was no    regurgitation.  - Left atrium: The atrium was mildly dilated.  - Right ventricle: The cavity size was normal. Wall thickness was    normal. Systolic function was normal.  - Right atrium: The atrium was normal in size.  - Tricuspid valve: There was no regurgitation.  - Pulmonic valve: There was no regurgitation.  - Pulmonary arteries: Systolic pressure was within the normal    range.  - Inferior vena cava: The vessel was normal in size.  - Pericardium, extracardiac: There was no pericardial effusion.   Event Monitor: 06/2020 Patient had a min HR of 50 bpm, max HR of 92 bpm, and avg HR of 67 bpm. Predominant underlying rhythm was sinus rhythm. 1st degree HB is present. Isolated PACs were rare (<1.0%) with rare couplets. Isolated PVCs were rare (<1.0%). No atrial fibrillation burden.   No malignant arrhythmias.  Assessment:    1. PAF (paroxysmal atrial fibrillation) ([place])   2. Essential hypertension   3.  Hyperlipidemia LDL goal <100   4. CKD (chronic kidney disease) stage 4, GFR 15-29 ml/min (HCC)      Plan:   In order of problems listed above:  1. Paroxysmal Atrial Fibrillation - She does report occasional palpitations but no persistent symptoms. She remains on Atenolol 50 mg twice daily. Would not further titrate given her baseline heart rate in the 50's today. - She did have vaginal bleeding on Xarelto and reports her PCP had her switch to ASA 81 mg daily. Denies any recurrent vaginal bleeding and does report having routine follow-up with her gynecologist with no recent abnormal test results. We reviewed that ASA does not have the same protective benefit in the setting of atrial fibrillation. She is open to trying Eliquis 5 mg twice daily, therefore will stop ASA. If she is intolerant to this, could consider referral to EP for [facility].  2. HTN - BP was initially elevated at 162/84, rechecked and improved to 138/82. She does report having whitecoat hypertension. Continue current medical therapy with Amlodipine-Olmesartan 10-40 mg daily, Atenolol 50 mg twice daily and Hydralazine 10 mg twice daily.  3. HLD - FLP in 02/2021 showed total cholesterol 159, triglycerides 99, HDL 78 and LDL 63. She remains on Crestor 20 mg daily.  4. Stage 4 CKD - Followed by [facility]. Creatinine was stable at 2.08 in 11/2021. Will defer continuation of Olmesartan to her Nephrologist.     Medication Adjustments/Labs and Tests Ordered: Current medicines are reviewed at length with the patient today.  Concerns regarding medicines are outlined above.  Medication changes, Labs and Tests ordered today are listed in the Patient Instructions below. Patient Instructions  Medication Instructions:   START Eliquis 5 mg twice a day   STOP Aspirin  Complete the patient assistance forms, bring copies of income to office.    30 Day free Trial card given today   Labwork: None  today  Testing/Procedures: None today  Follow-Up: 6 months  Any Other Special Instructions Will Be Listed Below (If Applicable).  If you need a refill on your cardiac medications before your next appointment, please call your pharmacy.    Signed, Erma Heritage, PA-C  03/31/2022 5:01 PM    [facility] [address] [place], [place] [address] Phone: [phone] Fax: [phone]

## 2022-05-19 ENCOUNTER — Telehealth: Payer: Self-pay | Admitting: Medical

## 2022-05-19 NOTE — Telephone Encounter (Signed)
Left message for patient to call back and schedule Medicare Annual Wellness Visit (AWV) either virtually or in office. I left my number for patient to call [phone].  awvi 05/14/22 per [facility] ; please schedule at anytime with health coach  This should be a 45 minute visit.

## 2022-06-21 ENCOUNTER — Encounter: Payer: Self-pay | Admitting: Internal Medicine

## 2022-06-21 ENCOUNTER — Ambulatory Visit (INDEPENDENT_AMBULATORY_CARE_PROVIDER_SITE_OTHER): Payer: Medicare Other | Admitting: Internal Medicine

## 2022-06-21 VITALS — BP 122/80 | HR 67 | Ht 68.0 in | Wt 225.0 lb

## 2022-06-21 DIAGNOSIS — E1165 Type 2 diabetes mellitus with hyperglycemia: Secondary | ICD-10-CM

## 2022-06-21 DIAGNOSIS — D3502 Benign neoplasm of left adrenal gland: Secondary | ICD-10-CM | POA: Diagnosis not present

## 2022-06-21 DIAGNOSIS — Z23 Encounter for immunization: Secondary | ICD-10-CM

## 2022-06-21 DIAGNOSIS — N1832 Chronic kidney disease, stage 3b: Secondary | ICD-10-CM

## 2022-06-21 DIAGNOSIS — E1122 Type 2 diabetes mellitus with diabetic chronic kidney disease: Secondary | ICD-10-CM | POA: Diagnosis not present

## 2022-06-21 LAB — POCT GLYCOSYLATED HEMOGLOBIN (HGB A1C): Hemoglobin A1C: 7.6 % — AB (ref 4.0–5.6)

## 2022-06-21 MED ORDER — INSULIN LISPRO PROT & LISPRO (75-25 MIX) 100 UNIT/ML KWIKPEN
PEN_INJECTOR | SUBCUTANEOUS | 3 refills | Status: DC
Start: 2022-06-21 — End: 2023-06-27

## 2022-06-21 MED ORDER — INSULIN PEN NEEDLE 32G X 4 MM MISC
1.0000 | Freq: Two times a day (BID) | 3 refills | Status: DC
Start: 2022-06-21 — End: 2023-06-27

## 2022-06-21 NOTE — Patient Instructions (Signed)
Change  Humalog MIX 14 units with Breakfast and 16 units with Supper    HOW TO TREAT LOW BLOOD SUGARS (Blood sugar LESS THAN 70 MG/DL) Please follow the RULE OF 15 for the treatment of hypoglycemia treatment (when your (blood sugars are less than 70 mg/dL)   STEP 1: Take 15 grams of carbohydrates when your blood sugar is low, which includes:  3-4 GLUCOSE TABS  OR 3-4 OZ OF JUICE OR REGULAR SODA OR ONE TUBE OF GLUCOSE GEL    STEP 2: RECHECK blood sugar in 15 MINUTES STEP 3: If your blood sugar is still low at the 15 minute recheck --> then, go back to STEP 1 and treat AGAIN with another 15 grams of carbohydrates.

## 2022-06-21 NOTE — Progress Notes (Signed)
Name: Kathy [name]  Age/ Sex: 66 y.o., female   MRN/ DOB: [id number], 1956/07/23     PCP: Caryl Ada   Reason for Endocrinology Evaluation: Type 2 Diabetes Mellitus  Initial Endocrine Consultative Visit: 05/31/2019    PATIENT IDENTIFIER: Kathy [name] is a 66 y.o. female with a past medical history of T2Dm, HTN, A.Fib. The patient has followed with Endocrinology clinic since 05/31/2019 for consultative assistance with management of her diabetes.  DIABETIC HISTORY:  Kathy [name] was diagnosed with T2DM in 2001. [place], Glipizide (Stopped 02/2019), tradjenta, metfoRMIN . Rybelsus started 04/2019 but did not help so she stopped it and restarted metformin, in 05/2019 metformin was stopped again and was advised not to take it due to low GFR. Her hemoglobin A1c has ranged from 6.7% in 2015, peaking at 10.4% in 2020.  Ozempic started 01/2020 but she discontinued due to symptoms of dehydration and the patient attributed low GFR to it  We stopped glipizide and started her on Humalog mix by June 2023 with an A1c of 10.3%  ADRENAL HISTORY: Pt was noted to have an incidental finding of a left adrenal adenoma 1.4 cm on MRI from 05/2018 during evaluation of a renal cysts. A CT scan of adrenal ( no contrast due to CKD and pt declined MRI 03/05/2020) showed an indeterminate left adrenal adenoma at 1 cm.    Labs were normal to include aldo, cortisol and metanephrines.   SUBJECTIVE:   During the last visit (02/18/2022): A1c 10.3%     Today (06/21/2022): Kathy [name] is here for a follow up on diabetes management.  She checks her blood sugars 2  times daily.The patient has not had hypoglycemic episodes since the last clinic visit.    She had a follow-up with cardiology on 03/31/2022 for paroxysmal atrial fibrillation  Denies nausea, vomiting or diarrhea   Was started on Farxiga by nephrology    HOME DIABETES REGIMEN:  Humalog mix 14 units twice daily Farxiga 10 mg daily but  nephrology       GLUCOSE Meter: unable to download 88- 235 mg/dL    DIABETIC COMPLICATIONS: Microvascular complications:  CKDIII Denies: retinopathy , neuropathy  Last eye exam: scheduled 12/2021   Macrovascular complications:    Denies: CAD, PVD, CVA       HISTORY:  Past Medical History:  Past Medical History:  Diagnosis Date   Atrial fibrillation ([place]) 05/2016   Diabetes mellitus without complication ([place])    type II   History of Helicobacter pylori infection    Hyperlipidemia    Hypertension    Microalbuminuria 2016   Obesity    Past Surgical History:  Past Surgical History:  Procedure Laterality Date   COLONOSCOPY     age 40yo   Social History:  reports that she has never smoked. She has never used smokeless tobacco. She reports that she does not currently use alcohol. She reports that she does not use drugs. Family History:  Family History  Problem Relation Age of Onset   Stroke Mother    Cancer Father        throat   Diabetes Sister    Diabetes Sister    Diabetes Sister    Cancer Sister        breast   Breast cancer Sister    Heart disease Neg Hx      HOME MEDICATIONS: Allergies as of 06/21/2022       Reactions   Xarelto [rivaroxaban]  Vaginal Bleeding        Medication List        Accurate as of June 21, 2022  9:32 AM. If you have any questions, ask your nurse or doctor.          allopurinol 100 MG tablet Commonly known as: ZYLOPRIM Take 2 tablets (200 mg total) by mouth daily.   amLODipine-olmesartan 10-40 MG tablet Commonly known as: AZOR Take 1 tablet by mouth daily.   apixaban 5 MG Tabs tablet Commonly known as: ELIQUIS Take 1 tablet (5 mg total) by mouth 2 (two) times daily.   atenolol 50 MG tablet Commonly known as: TENORMIN Take 1 tablet (50 mg total) by mouth 2 (two) times daily.   blood glucose meter kit and supplies Kit Dispense based on patient and insurance preference. Use up to four times daily as  directed. (FOR ICD-9 250.00, 250.01).   Farxiga 10 MG Tabs tablet Generic drug: dapagliflozin propanediol Take 10 mg by mouth daily.   ferrous sulfate 325 (65 FE) MG EC tablet Take 65 mg by mouth 3 (three) times daily with meals.   glucose blood test strip Contour Next test strips. Use as instructed   hydrALAZINE 10 MG tablet Commonly known as: APRESOLINE TAKE 1 TABLET BY MOUTH TWICE DAILY   Insulin Lispro Prot & Lispro (75-25) 100 UNIT/ML Kwikpen Commonly known as: HumaLOG Mix 75/25 KwikPen Inject 14 Units into the skin 2 (two) times daily with a meal.   Insulin Pen Needle 32G X 4 MM Misc 1 Device by Does not apply route in the morning and at bedtime.   multivitamin with minerals Tabs tablet Take 1 tablet by mouth daily.   rosuvastatin 20 MG tablet Commonly known as: CRESTOR Take 1 tablet (20 mg total) by mouth daily.   vitamin C 250 MG tablet Commonly known as: ASCORBIC ACID Take 250 mg by mouth daily.         OBJECTIVE:   Vital Signs:BP 122/80 (BP Location: Left Arm, Patient Position: Sitting, Cuff Size: Large)   Pulse 67   Ht 5' 8"  (1.727 m)   Wt 225 lb (102.1 kg)   SpO2 99%   BMI 34.21 kg/m   Wt Readings from Last 3 Encounters:  06/21/22 225 lb (102.1 kg)  03/31/22 225 lb 12.8 oz (102.4 kg)  02/23/22 219 lb (99.3 kg)     Exam: General: Pt appears well and is in NAD  Lungs: Clear with good BS bilat with no rales, rhonchi, or wheezes  Heart: RRR   Abdomen: soft, nontender  Extremities: No pretibial edema.  Neuro: MS is good with appropriate affect, pt is alert and Ox3      DM foot exam: 06/21/2022 The skin of the feet is intact without sores or ulcerations. The pedal pulses are 2+ on right and 2+ on left. The sensation is intact to a screening 5.07, 10 gram monofilament bilaterally    DATA REVIEWED:  Lab Results  Component Value Date   HGBA1C 7.6 (A) 06/21/2022   HGBA1C 10.3 (A) 02/18/2022   HGBA1C 8.5 (A) 11/20/2021     Latest  Reference Range & Units 11/20/21 08:22  ALDOSTERONE 0.0 - 30.0 ng/dL 1.5  Renin 0.167 - 5.380 ng/mL/hr 1.290  ALDOS/RENIN RATIO 0.0 - 30.0  1.2    Latest Reference Range & Units 11/23/21 08:42  Cortisol (Ur), Free 4.0 - 50.0 mcg/24 h 10.6   04/23/2022 BUN 51 Cr 2.45 GFR 24.55 v  CT scan 09/25/2021  Adrenals/Urinary Tract:  Stable 1.2 cm left adrenal nodule. No right adrenal nodule is seen.   ASSESSMENT / PLAN / RECOMMENDATIONS:   1) Type 2 Diabetes Mellitus, with improving glycemic control, With CKD III complications - Most recent A1c of 7.6 %. Goal A1c < 7.0 %.    - Praised the pt on improved glycemic control  - Declines GLP-1 agonists as she attributes dehydration and low GFR to them  - Declines CGM technology  - She is on farxiga through nephrology  - Will increase insulin with supper as below     MEDICATIONS:  -change  Humalog mix 14 units with Breakfast and 16 units with Supper        EDUCATION / INSTRUCTIONS: BG monitoring instructions: Patient is instructed to check her blood sugars 3 times a day, before meals  Call [facility] [place] [facility] clinic if: BG persistently < 70 I reviewed the Rule of 15 for the treatment of hypoglycemia in detail with the patient. Literature supplied.  2. Left Adrenal Adenoma :   - Stable imaging (09/2021) - No clinical or biochemical evidence of extra hormonal excretion ( labs 11/2021)    F/U in 6 months    Signed electronically by: Mack Guise, MD  [facility] End[facility]in[facility]  [facility] [address] [place], [place] [address] Phone: [phone] FAX: [phone]   CC: Carlena Hurl, PA-C [place] [address] Phone: [phone]  Fax: [phone]  Return to Endocrinology clinic as below: Future Appointments  Date Time Provider [place]  07/02/2022 10:15 AM PFM-NURSE HEALTH ADVISOR PFM[facility]

## 2022-07-02 ENCOUNTER — Ambulatory Visit: Payer: Medicare Other

## 2022-07-02 ENCOUNTER — Telehealth: Payer: Self-pay

## 2022-07-02 NOTE — Telephone Encounter (Signed)
This nurse attempted to call patient three times for telephonic AWV. Message left that we will call again to reschedule for another time. 

## 2022-07-03 ENCOUNTER — Other Ambulatory Visit: Payer: Self-pay | Admitting: Student

## 2022-07-08 ENCOUNTER — Telehealth: Payer: Self-pay | Admitting: Medical

## 2022-07-08 NOTE — Telephone Encounter (Signed)
Left message for patient to call back and schedule Medicare Annual Wellness Visit (AWV) either virtually or in office. Left  my Herbie Drape number [phone]  awvi 05/14/22 per [facility]  please schedule with Nurse Health Adviser   45 min for awv-i and in office appointments 30 min for awv-s  phone/virtual appointments

## 2022-07-26 ENCOUNTER — Encounter: Payer: Self-pay | Admitting: Medical

## 2022-08-12 ENCOUNTER — Telehealth: Payer: Self-pay | Admitting: Medical

## 2022-08-12 NOTE — Telephone Encounter (Signed)
Left message for patient to call back and schedule Medicare Annual Wellness Visit (AWV) either virtually or in office. Left  my Kathy [name] number [phone]   Awvi 05/14/22 per [facility]  please schedule with Nurse Health Adviser   45 min for awv-i and in office appointments 30 min for awv-s  phone/virtual appointments

## 2022-09-20 ENCOUNTER — Telehealth: Payer: Self-pay | Admitting: Medical

## 2022-09-20 NOTE — Telephone Encounter (Signed)
Tried calling patient to schedule Medicare Annual Wellness Visit (AWV) either virtually or in office. Left  my Kathy [name] number [phone]   Phone not working  awvi 05/14/22 per [facility] please schedule with Nurse Health Adviser   45 min for awv-i  in office appointments 30 min for awv-  phone/virtual appointments

## 2022-10-04 DIAGNOSIS — R809 Proteinuria, unspecified: Secondary | ICD-10-CM | POA: Diagnosis not present

## 2022-10-04 DIAGNOSIS — N184 Chronic kidney disease, stage 4 (severe): Secondary | ICD-10-CM | POA: Diagnosis not present

## 2022-10-04 DIAGNOSIS — N2581 Secondary hyperparathyroidism of renal origin: Secondary | ICD-10-CM | POA: Diagnosis not present

## 2022-10-04 DIAGNOSIS — I4891 Unspecified atrial fibrillation: Secondary | ICD-10-CM | POA: Diagnosis not present

## 2022-10-04 DIAGNOSIS — E1122 Type 2 diabetes mellitus with diabetic chronic kidney disease: Secondary | ICD-10-CM | POA: Diagnosis not present

## 2022-10-04 DIAGNOSIS — E278 Other specified disorders of adrenal gland: Secondary | ICD-10-CM | POA: Diagnosis not present

## 2022-10-04 DIAGNOSIS — E875 Hyperkalemia: Secondary | ICD-10-CM | POA: Diagnosis not present

## 2022-10-04 DIAGNOSIS — D631 Anemia in chronic kidney disease: Secondary | ICD-10-CM | POA: Diagnosis not present

## 2022-10-04 DIAGNOSIS — N189 Chronic kidney disease, unspecified: Secondary | ICD-10-CM | POA: Diagnosis not present

## 2022-10-04 DIAGNOSIS — I129 Hypertensive chronic kidney disease with stage 1 through stage 4 chronic kidney disease, or unspecified chronic kidney disease: Secondary | ICD-10-CM | POA: Diagnosis not present

## 2022-12-16 ENCOUNTER — Encounter: Payer: Self-pay | Admitting: Student

## 2022-12-16 ENCOUNTER — Ambulatory Visit: Payer: Medicare Other | Attending: Student | Admitting: Student

## 2022-12-16 VITALS — BP 136/74 | HR 60 | Ht 68.0 in | Wt 238.0 lb

## 2022-12-16 DIAGNOSIS — E785 Hyperlipidemia, unspecified: Secondary | ICD-10-CM

## 2022-12-16 DIAGNOSIS — N184 Chronic kidney disease, stage 4 (severe): Secondary | ICD-10-CM

## 2022-12-16 DIAGNOSIS — I480 Paroxysmal atrial fibrillation: Secondary | ICD-10-CM | POA: Diagnosis not present

## 2022-12-16 DIAGNOSIS — I10 Essential (primary) hypertension: Secondary | ICD-10-CM | POA: Diagnosis not present

## 2022-12-16 NOTE — Patient Instructions (Signed)
Medication Instructions:  Your physician recommends that you continue on your current medications as directed. Please refer to the Current Medication list given to you today.   Labwork: None today  Testing/Procedures: None today  Follow-Up: 6 months  Any Other Special Instructions Will Be Listed Below (If Applicable).  If you need a refill on your cardiac medications before your next appointment, please call your pharmacy.  

## 2022-12-16 NOTE — Progress Notes (Signed)
Cardiology Office Note    Date:  12/16/2022  ID:  Kathy [name], Kathy [name] 1956/04/07, MRN [id number] Cardiologist: Werner Lean, MD    History of Present Illness:    Kathy [name] is a 67 y.o. female with past medical history of paroxysmal atrial fibrillation (diagnosed in 05/2016 with no recurrence by event monitor in 06/2020, documented recurrence in 09/2021), HTN, HLD, Type 2 DM, Stage 4 CKD and adrenal adenoma who presents to the office today for overdue 54-month follow-up.  She was examined by myself in 03/2022 and reported occasional brief palpitations but they would spontaneously resolve. Denied any associated chest pain or dyspnea on exertion. She had previously been on Xarelto but quit taking this in the interim due to vaginal bleeding. She denied any recurrent bleeding and had followed up with her gynecologist with no recent abnormal test results. Options were reviewed and she was open to trying Eliquis 5 mg twice daily for anticoagulation with ASA being discontinued. It was recommended that if she was intolerant to this, could consider referral for Watchman device placement.  In talking with the patient today, she reports overall doing well since her last office visit. She denies any recent chest pain or dyspnea on exertion. Reports occasional palpitations at night but no persistent symptoms. No recent orthopnea, PND or pitting edema. She remains on Eliquis for anticoagulation with no recurrent vaginal bleeding. No recent melena, hematochezia or hematuria.  Studies Reviewed:   EKG: EKG is ordered today and demonstrates sinus bradycardia, HR 57 with no acute ST changes.   Echocardiogram: 05/2016 Study Conclusions   - Left ventricle: The cavity size was normal. There was mild    concentric hypertrophy. Systolic function was normal. The    estimated ejection fraction was in the range of 55% to 60%. Wall    motion was normal; there were no regional wall motion     abnormalities. Left ventricular diastolic function parameters    were normal.  - Aortic valve: Trileaflet; normal thickness leaflets.  - Aortic root: The aortic root was normal in size.  - Mitral valve: Structurally normal valve. There was no    regurgitation.  - Left atrium: The atrium was mildly dilated.  - Right ventricle: The cavity size was normal. Wall thickness was    normal. Systolic function was normal.  - Right atrium: The atrium was normal in size.  - Tricuspid valve: There was no regurgitation.  - Pulmonic valve: There was no regurgitation.  - Pulmonary arteries: Systolic pressure was within the normal    range.  - Inferior vena cava: The vessel was normal in size.  - Pericardium, extracardiac: There was no pericardial effusion.   Event Monitor: 06/2020 Patient had a min HR of 50 bpm, max HR of 92 bpm, and avg HR of 67 bpm. Predominant underlying rhythm was sinus rhythm. 1st degree HB is present. Isolated PACs were rare (<1.0%) with rare couplets. Isolated PVCs were rare (<1.0%). No atrial fibrillation burden.   No malignant arrhythmias.   Physical Exam:   VS:  BP 136/74   Pulse 60   Ht 5\' 8"  (1.727 m)   Wt 238 lb (108 kg)   SpO2 98%   BMI 36.19 kg/m    Wt Readings from Last 3 Encounters:  12/16/22 238 lb (108 kg)  06/21/22 225 lb (102.1 kg)  03/31/22 225 lb 12.8 oz (102.4 kg)     GEN: Pleasant, female appearing in no acute distress NECK: No JVD; No carotid  bruits CARDIAC: RRR, no murmurs, rubs, gallops RESPIRATORY:  Clear to auscultation without rales, wheezing or rhonchi  ABDOMEN: Appears non-distended. No obvious abdominal masses. EXTREMITIES: No clubbing or cyanosis. No pitting edema.  Distal pedal pulses are 2+ bilaterally.   Assessment and Plan:   1. Paroxysmal Atrial Fibrillation - She reports occasional palpitations but no persistent symptoms resembling her prior atrial fibrillation. Remains on Atenolol 50mg  BID for rate-control.  - No  reports of active bleeding. Continue Eliquis 5mg  BID for anticoagulation. Labs in 06/2022 showed her Hgb was at 13.4 with platelets at 315 K.   2. HTN - BP is at 136/74 during today's visit. Continue Amlodipine-Olmesartan 10-40mg  daily, Atenolol 50mg  BID and Hydralazine 10mg  BID. Will defer continuation of Olmesartan to Nephrology.   3. HLD - Followed by her PCP.  We will request a copy of most recent labs. She remains on Crestor 20mg  daily.   4. Stage 4 CKD - Followed by [facility]. Creatinine was at 3.20 in 06/2022. Will defer continuation of Olmesartan and Farxiga to Nephrology.    Signed, Erma Heritage, PA-C

## 2022-12-22 DIAGNOSIS — H40033 Anatomical narrow angle, bilateral: Secondary | ICD-10-CM | POA: Diagnosis not present

## 2022-12-22 DIAGNOSIS — E119 Type 2 diabetes mellitus without complications: Secondary | ICD-10-CM | POA: Diagnosis not present

## 2022-12-27 DIAGNOSIS — N189 Chronic kidney disease, unspecified: Secondary | ICD-10-CM | POA: Diagnosis not present

## 2022-12-27 DIAGNOSIS — N184 Chronic kidney disease, stage 4 (severe): Secondary | ICD-10-CM | POA: Diagnosis not present

## 2022-12-29 ENCOUNTER — Encounter: Payer: Self-pay | Admitting: Internal Medicine

## 2022-12-29 ENCOUNTER — Ambulatory Visit (INDEPENDENT_AMBULATORY_CARE_PROVIDER_SITE_OTHER): Payer: Medicare Other | Admitting: Internal Medicine

## 2022-12-29 VITALS — BP 120/80 | HR 65 | Ht 68.0 in | Wt 241.0 lb

## 2022-12-29 DIAGNOSIS — Z794 Long term (current) use of insulin: Secondary | ICD-10-CM

## 2022-12-29 DIAGNOSIS — N1832 Chronic kidney disease, stage 3b: Secondary | ICD-10-CM

## 2022-12-29 DIAGNOSIS — E1165 Type 2 diabetes mellitus with hyperglycemia: Secondary | ICD-10-CM | POA: Diagnosis not present

## 2022-12-29 DIAGNOSIS — E1122 Type 2 diabetes mellitus with diabetic chronic kidney disease: Secondary | ICD-10-CM | POA: Diagnosis not present

## 2022-12-29 DIAGNOSIS — N184 Chronic kidney disease, stage 4 (severe): Secondary | ICD-10-CM

## 2022-12-29 DIAGNOSIS — D3502 Benign neoplasm of left adrenal gland: Secondary | ICD-10-CM | POA: Diagnosis not present

## 2022-12-29 LAB — BASIC METABOLIC PANEL
BUN: 51 mg/dL — ABNORMAL HIGH (ref 6–23)
CO2: 22 mEq/L (ref 19–32)
Calcium: 10.1 mg/dL (ref 8.4–10.5)
Chloride: 104 mEq/L (ref 96–112)
Creatinine, Ser: 2.4 mg/dL — ABNORMAL HIGH (ref 0.40–1.20)
GFR: 20.52 mL/min — ABNORMAL LOW (ref >60.00)
Glucose, Bld: 177 mg/dL — ABNORMAL HIGH (ref 70–99)
Potassium: 4.8 mEq/L (ref 3.5–5.1)
Sodium: 134 mEq/L — ABNORMAL LOW (ref 135–145)

## 2022-12-29 LAB — POCT GLYCOSYLATED HEMOGLOBIN (HGB A1C): Hemoglobin A1C: 8.7 % — AB (ref 4.0–5.6)

## 2022-12-29 MED ORDER — TIRZEPATIDE 2.5 MG/0.5ML ~~LOC~~ SOAJ: 2.5000 mg | SUBCUTANEOUS | 3 refills | Status: DC

## 2022-12-29 NOTE — Patient Instructions (Addendum)
Start Mounjaro 2.5 mg ONCE WEEKLY  Change  Humalog MIX 14 units with Breakfast and 18 units with Supper    HOW TO TREAT LOW BLOOD SUGARS (Blood sugar LESS THAN 70 MG/DL) Please follow the RULE OF 15 for the treatment of hypoglycemia treatment (when your (blood sugars are less than 70 mg/dL)   STEP 1: Take 15 grams of carbohydrates when your blood sugar is low, which includes:  3-4 GLUCOSE TABS  OR 3-4 OZ OF JUICE OR REGULAR SODA OR ONE TUBE OF GLUCOSE GEL    STEP 2: RECHECK blood sugar in 15 MINUTES STEP 3: If your blood sugar is still low at the 15 minute recheck --> then, go back to STEP 1 and treat AGAIN with another 15 grams of carbohydrates.

## 2022-12-29 NOTE — Progress Notes (Signed)
Name: Kathy [name]  Age/ Sex: 67 y.o., female   MRN/ DOB: [id number], 12-13-1955     PCP: Genia Del   Reason for Endocrinology Evaluation: Type 2 Diabetes Mellitus  Initial Endocrine Consultative Visit: 05/31/2019    PATIENT IDENTIFIER: Kathy [name] is a 67 y.o. female with a past medical history of T2Dm, HTN, A.Fib. The patient has followed with Endocrinology clinic since 05/31/2019 for consultative assistance with management of her diabetes.  DIABETIC HISTORY:  Kathy [name] was diagnosed with T2DM in 2001. Marcelline Deist, Glipizide (Stopped 02/2019), tradjenta, metfoRMIN . Rybelsus started 04/2019 but did not help so she stopped it and restarted metformin, in 05/2019 metformin was stopped again and was advised not to take it due to low GFR. Her hemoglobin A1c has ranged from 6.7% in 2015, peaking at 10.4% in 2020.  Ozempic started 01/2020 but she discontinued due to symptoms of dehydration and the patient attributed low GFR to it  We stopped glipizide and started her on Humalog mix by June 2023 with an A1c of 10.3% Farxiga by nephrology 2023   Started   ADRENAL HISTORY: Pt was noted to have an incidental finding of a left adrenal adenoma 1.4 cm on MRI from 05/2018 during evaluation of a renal cysts. A CT scan of adrenal ( no contrast due to CKD and pt declined MRI 03/05/2020) showed an indeterminate left adrenal adenoma at 1 cm.    Labs were normal to include aldo, cortisol and metanephrines.   SUBJECTIVE:   During the last visit (02/18/2022): A1c 7.6 %     Today (12/29/2022): Kathy [name] is here for a follow up on diabetes management.  She checks her blood sugars 2  times daily.The patient has not had hypoglycemic episodes since the last clinic visit.    She had a follow-up with cardiology on 12/16/2022 for paroxysmal atrial fibrillation She was seen by Dr. Arrie Aran 10/04/2022  Denies nausea, vomiting or diarrhea  Has rare  palpitations  BP well controlled    HOME DIABETES REGIMEN:  Humalog mix 14 units QAM  16 units QPM  Farxiga 10 mg daily through nephrology       GLUCOSE Meter: unable to download 127- 222 mg/dL    DIABETIC COMPLICATIONS: Microvascular complications:  CKDIII Denies: retinopathy , neuropathy  Last eye exam: scheduled 12/2022   Macrovascular complications:    Denies: CAD, PVD, CVA       HISTORY:  Past Medical History:  Past Medical History:  Diagnosis Date   Atrial fibrillation 05/2016   Diabetes mellitus without complication    type II   History of Helicobacter pylori infection    Hyperlipidemia    Hypertension    Microalbuminuria 2016   Obesity    Past Surgical History:  Past Surgical History:  Procedure Laterality Date   COLONOSCOPY     age 18yo   Social History:  reports that she has never smoked. She has never used smokeless tobacco. She reports that she does not currently use alcohol. She reports that she does not use drugs. Family History:  Family History  Problem Relation Age of Onset   Stroke Mother    Cancer Father        throat   Diabetes Sister    Diabetes Sister    Diabetes Sister    Cancer Sister        breast   Breast cancer Sister    Heart disease Neg Hx      HOME MEDICATIONS:  Allergies as of 12/29/2022       Reactions   Xarelto [rivaroxaban]    Vaginal Bleeding        Medication List        Accurate as of December 29, 2022  8:34 AM. If you have any questions, ask your nurse or doctor.          allopurinol 100 MG tablet Commonly known as: ZYLOPRIM Take 2 tablets (200 mg total) by mouth daily.   amLODipine-olmesartan 10-40 MG tablet Commonly known as: AZOR Take 1 tablet by mouth daily.   apixaban 5 MG Tabs tablet Commonly known as: ELIQUIS Take 1 tablet (5 mg total) by mouth 2 (two) times daily.   atenolol 50 MG tablet Commonly known as: TENORMIN TAKE 1 TABLET(50 MG) BY MOUTH TWICE DAILY   blood glucose meter kit and supplies Kit Dispense based  on patient and insurance preference. Use up to four times daily as directed. (FOR ICD-9 250.00, 250.01).   Farxiga 10 MG Tabs tablet Generic drug: dapagliflozin propanediol Take 10 mg by mouth daily.   ferrous sulfate 325 (65 FE) MG EC tablet Take 65 mg by mouth 3 (three) times daily with meals.   glucose blood test strip Contour Next test strips. Use as instructed   hydrALAZINE 10 MG tablet Commonly known as: APRESOLINE TAKE 1 TABLET BY MOUTH TWICE DAILY   Insulin Lispro Prot & Lispro (75-25) 100 UNIT/ML Kwikpen Commonly known as: HumaLOG Mix 75/25 KwikPen Inject 14 Units into the skin daily with breakfast AND 16 Units daily with supper.   Insulin Pen Needle 32G X 4 MM Misc 1 Device by Does not apply route in the morning and at bedtime.   multivitamin with minerals Tabs tablet Take 1 tablet by mouth daily.   rosuvastatin 20 MG tablet Commonly known as: CRESTOR Take 1 tablet (20 mg total) by mouth daily.   vitamin C 250 MG tablet Commonly known as: ASCORBIC ACID Take 250 mg by mouth daily.         OBJECTIVE:   Vital Signs:BP 120/80 (BP Location: Left Arm, Patient Position: Sitting, Cuff Size: Large)   Pulse 65   Ht  (1.727 m)   Wt 241 lb (109.3 kg)   SpO2 99%   BMI 36.64 kg/m   Wt Readings from Last 3 Encounters:  12/29/22 241 lb (109.3 kg)  12/16/22 238 lb (108 kg)  06/21/22 225 lb (102.1 kg)     Exam: General: Pt appears well and is in NAD  Lungs: Clear with good BS bilat with no rales, rhonchi, or wheezes  Heart: RRR   Abdomen: soft, nontender  Extremities: No pretibial edema.  Neuro: MS is good with appropriate affect, pt is alert and Ox3      DM foot exam: 06/21/2022 The skin of the feet is intact without sores or ulcerations. The pedal pulses are 2+ on right and 2+ on left. The sensation is intact to a screening 5.07, 10 gram monofilament bilaterally    DATA REVIEWED:  Lab Results  Component Value Date   HGBA1C 8.7 (A) 12/29/2022    HGBA1C 7.6 (A) 06/21/2022   HGBA1C 10.3 (A) 02/18/2022    Latest Reference Range & Units 12/29/22 08:49  Sodium 135 - 145 mEq/L 134 (L)  Potassium 3.5 - 5.1 mEq/L 4.8  Chloride 96 - 112 mEq/L 104  CO2 19 - 32 mEq/L 22  Glucose 70 - 99 mg/dL 829 (H)  BUN 6 - 23 mg/dL 51 (H)  Creatinine 0.40 - 1.20 mg/dL 1.61 (H)  Calcium 8.4 - 10.5 mg/dL 09.6  GFR >04.54 mL/min 20.52 (L)  (L): Data is abnormally low (H): Data is abnormally high    10/04/2022 BUN 42 Cr. 2.32 GFR 23 Ca 10.4 Alb 4.5 VitD 34.9 PTH 14PT-rp < 2   CT scan 09/25/2021  Adrenals/Urinary Tract: Stable 1.2 cm left adrenal nodule. No right adrenal nodule is seen.   ASSESSMENT / PLAN / RECOMMENDATIONS:   1) Type 2 Diabetes Mellitus, Poorly controlled, With CKD IV complications - Most recent A1c of 8.7 Goal A1c < 7.0 %.    -  Pt with wosening glycemic control  - Declined GLP-1 agonists as she attributes dehydration and low GFR to them in the past but today she agreed to trying mounjaro , cautioned against GI side effects  - Declines CGM technology  - She is on farxiga through nephrology  - Will increase insulin with supper as below - BMP continues to show low GFR, will defer to nephrology    MEDICATIONS: - Start Mounjaro 2.5 mg weekly  -change  Humalog mix 14 units with Breakfast and 18 units with Supper        EDUCATION / INSTRUCTIONS: BG monitoring instructions: Patient is instructed to check her blood sugars 3 times a day, before meals  Call [facility] if: BG persistently < 70 I reviewed the Rule of 15 for the treatment of hypoglycemia in detail with the patient. Literature supplied.  2. Left Adrenal Adenoma :   - Stable imaging (09/2021) from 2021 - No clinical or biochemical evidence of extra hormonal excretion ( labs 11/2021) - renin and aldo pending    F/U in 6 months    Signed electronically by: Lyndle Herrlich, MD  [facility] Endocrinology  [facility] [address] [place], [place] [address] Phone: [phone] FAX: [phone]   CC: Jac Canavan, PA-C [address] [place] [address] Phone: [phone]  Fax: [phone]  Return to Endocrinology clinic as below: Future Appointments  Date Time Provider Department Center  06/17/2023  3:00 PM Strader, Lennart Pall, PA-C [facility]

## 2022-12-31 ENCOUNTER — Encounter: Payer: Self-pay | Admitting: Internal Medicine

## 2023-01-03 DIAGNOSIS — I129 Hypertensive chronic kidney disease with stage 1 through stage 4 chronic kidney disease, or unspecified chronic kidney disease: Secondary | ICD-10-CM | POA: Diagnosis not present

## 2023-01-03 DIAGNOSIS — D631 Anemia in chronic kidney disease: Secondary | ICD-10-CM | POA: Diagnosis not present

## 2023-01-03 DIAGNOSIS — N2581 Secondary hyperparathyroidism of renal origin: Secondary | ICD-10-CM | POA: Diagnosis not present

## 2023-01-03 DIAGNOSIS — R809 Proteinuria, unspecified: Secondary | ICD-10-CM | POA: Diagnosis not present

## 2023-01-03 DIAGNOSIS — E1122 Type 2 diabetes mellitus with diabetic chronic kidney disease: Secondary | ICD-10-CM | POA: Diagnosis not present

## 2023-01-03 DIAGNOSIS — I4891 Unspecified atrial fibrillation: Secondary | ICD-10-CM | POA: Diagnosis not present

## 2023-01-03 DIAGNOSIS — N184 Chronic kidney disease, stage 4 (severe): Secondary | ICD-10-CM | POA: Diagnosis not present

## 2023-01-04 LAB — ALDOSTERONE + RENIN ACTIVITY W/ RATIO
Aldosterone: <1 ng/dL (ref 0.0–30.0)
Renin Activity, Plasma: 2.909 ng/mL/hr (ref 0.167–5.380)

## 2023-01-23 ENCOUNTER — Other Ambulatory Visit: Payer: Self-pay | Admitting: Student

## 2023-01-24 NOTE — Telephone Encounter (Signed)
Prescription refill request for Eliquis received. Indication: PAF Last office visit: 12/16/22  B Strader PA-C Scr: 2.40 on 12/29/22  Epic Age: 67 Weight: 108kg  Based on above findings Eliquis 5mg  twice daily is the appropriate dose.  Refill approved.

## 2023-02-08 ENCOUNTER — Ambulatory Visit (INDEPENDENT_AMBULATORY_CARE_PROVIDER_SITE_OTHER): Payer: Medicare Other

## 2023-02-08 VITALS — Ht 67.0 in | Wt 230.0 lb

## 2023-02-08 DIAGNOSIS — Z0000 Encounter for general adult medical examination without abnormal findings: Secondary | ICD-10-CM | POA: Diagnosis not present

## 2023-02-08 NOTE — Progress Notes (Signed)
I connected with  Kathy [name] on 02/08/23 by a audio enabled telemedicine application and verified that I am speaking with the correct person using two identifiers.  Patient Location: Home  Provider Location: Office/Clinic  I discussed the limitations of evaluation and management by telemedicine. The patient expressed understanding and agreed to proceed.  Subjective:   Kathy [name] is a 67 y.o. female who presents for an Initial Medicare Annual Wellness Visit.  Review of Systems     Cardiac Risk Factors include: advanced age (>38men, >40 women);diabetes mellitus;dyslipidemia;hypertension;obesity (BMI >30kg/m2)     Objective:    Today's Vitals   02/08/23 1142  Weight: 230 lb (104.3 kg)  Height: 5\' 7"  (1.702 m)   Body mass index is 36.02 kg/m.     02/08/2023   11:46 AM 09/25/2021    3:18 AM 06/06/2021    8:53 PM 12/06/2019    9:10 AM 03/18/2017    9:35 PM 01/21/2015    8:20 AM  Advanced Directives  Does Patient Have a Medical Advance Directive? No No No No No No  Would patient like information on creating a medical advance directive?  No - Patient declined No - Patient declined No - Patient declined  No - patient declined information    Current Medications (verified) Outpatient Encounter Medications as of 02/08/2023  Medication Sig   allopurinol (ZYLOPRIM) 100 MG tablet Take 2 tablets (200 mg total) by mouth daily.   amLODipine-olmesartan (AZOR) 10-40 MG tablet Take 1 tablet by mouth daily.   atenolol (TENORMIN) 50 MG tablet TAKE 1 TABLET(50 MG) BY MOUTH TWICE DAILY   blood glucose meter kit and supplies KIT Dispense based on patient and insurance preference. Use up to four times daily as directed. (FOR ICD-9 250.00, 250.01).   ELIQUIS 5 MG TABS tablet TAKE 1 TABLET(5 MG) BY MOUTH TWICE DAILY   FARXIGA 10 MG TABS tablet Take 10 mg by mouth daily.   ferrous sulfate 325 (65 FE) MG EC tablet Take 65 mg by mouth 3 (three) times daily with meals.   glucose blood  test strip Contour Next test strips. Use as instructed   hydrALAZINE (APRESOLINE) 10 MG tablet TAKE 1 TABLET BY MOUTH TWICE DAILY   Insulin Lispro Prot & Lispro (HUMALOG MIX 75/25 KWIKPEN) (75-25) 100 UNIT/ML Kwikpen Inject 14 Units into the skin daily with breakfast AND 16 Units daily with supper.   Insulin Pen Needle 32G X 4 MM MISC 1 Device by Does not apply route in the morning and at bedtime.   Multiple Vitamin (MULTIVITAMIN WITH MINERALS) TABS tablet Take 1 tablet by mouth daily.   rosuvastatin (CRESTOR) 20 MG tablet Take 1 tablet (20 mg total) by mouth daily.   tirzepatide [facility]) 2.5 MG/0.5ML Pen Inject 2.5 mg into the skin once a week.   vitamin C (ASCORBIC ACID) 250 MG tablet Take 250 mg by mouth daily.   No facility-administered encounter medications on file as of 02/08/2023.    Allergies (verified) Xarelto [rivaroxaban]   History: Past Medical History:  Diagnosis Date   Atrial fibrillation (HCC) 05/2016   Diabetes mellitus without complication (HCC)    type II   History of Helicobacter pylori infection    Hyperlipidemia    Hypertension    Microalbuminuria 2016   Obesity    Past Surgical History:  Procedure Laterality Date   COLONOSCOPY     age 79yo   Family History  Problem Relation Age of Onset   Stroke Mother    Cancer  Father        throat   Diabetes Sister    Diabetes Sister    Diabetes Sister    Cancer Sister        breast   Breast cancer Sister    Heart disease Neg Hx    Social History   Socioeconomic History   Marital status: Married    Spouse name: Not on file   Number of children: Not on file   Years of education: Not on file   Highest education level: Not on file  Occupational History   Not on file  Tobacco Use   Smoking status: Never   Smokeless tobacco: Never  Vaping Use   Vaping Use: Never used  Substance and Sexual Activity   Alcohol use: Not Currently    Comment: rarely   Drug use: No   Sexual activity: Not on file  Other  Topics Concern   Not on file  Social History Narrative   Lives with husband and son.   Retired.    Does housework, cooks, clean, busy.  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   Social Determinants of Health   Financial Resource Strain: Low Risk  (02/08/2023)   Overall Financial Resource Strain (CARDIA)    Difficulty of Paying Living Expenses: Not hard at all  Food Insecurity: No Food Insecurity (02/08/2023)   Hunger Vital Sign    Worried About Running Out of Food in the Last Year: Never true    Ran Out of Food in the Last Year: Never true  Transportation Needs: No Transportation Needs (02/08/2023)   PRAPARE - Administrator, Civil Service (Medical): No    Lack of Transportation (Non-Medical): No  Physical Activity: Insufficiently Active (02/08/2023)   Exercise Vital Sign    Days of Exercise per Week: 3 days    Minutes of Exercise per Session: 20 min  Stress: No Stress Concern Present (02/08/2023)   [name] of Occupational Health - Occupational Stress Questionnaire    Feeling of Stress : Not at all  Social Connections: Not on file    Tobacco Counseling Counseling given: Not Answered   Clinical Intake:  Pre-visit preparation completed: Yes  Pain : No/denies pain     Nutritional Status: BMI > 30  Obese Nutritional Risks: None Diabetes: Yes  How often do you need to have someone help you when you read instructions, pamphlets, or other written materials from your doctor or pharmacy?: 1 - Never  Diabetic? Yes Nutrition Risk Assessment:  Has the patient had any N/V/D within the last 2 months?  No  Does the patient have any non-healing wounds?  No  Has the patient had any unintentional weight loss or weight gain?  No   Diabetes:  Is the patient diabetic?  Yes  If diabetic, was a CBG obtained today?  No  Did the patient bring in their glucometer from home?  No  How often do you monitor your CBG's? Twice daily.   Financial Strains and Diabetes Management:  Are you  having any financial strains with the device, your supplies or your medication? No .  Does the patient [name] to be seen by Chronic Care Management for management of their diabetes?  No  Would the patient like to be referred to a Nutritionist or for Diabetic Management?  No   Diabetic Exams:  Diabetic Eye Exam: Overdue for diabetic eye exam. Pt has been advised about the importance in completing this exam. Patient advised to call and  schedule an eye exam. Diabetic Foot Exam: Completed 06/21/2022   Interpreter Needed?: No  Information entered by :: NAllen LPN   Activities of Daily Living    02/08/2023   11:48 AM  In your present state of health, do you have any difficulty performing the following activities:  Hearing? 0  Vision? 0  Difficulty concentrating or making decisions? 0  Walking or climbing stairs? 1  Dressing or bathing? 0  Doing errands, shopping? 0  Preparing Food and eating ? N  Using the Toilet? N  In the past six months, have you accidently leaked urine? N  Do you have problems with loss of bowel control? N  Managing your Medications? N  Managing your Finances? N  Housekeeping or managing your Housekeeping? N    Patient Care Team: Tysinger, Kermit Balo, PA-C as PCP - General (Family Medicine) Christell Constant, MD as PCP - Cardiology (Cardiology)  Indicate any recent Medical Services you may have received from other than [facility] providers in the past year (date may be approximate).     Assessment:   This is a routine wellness examination for [place].  Hearing/Vision screen Vision Screening - Comments:: Regular eye exams, [facility]  Dietary issues and exercise activities discussed: Current Exercise Habits: Home exercise routine, Type of exercise: walking, Time (Minutes): 20, Frequency (Times/Week): 3, Weekly Exercise (Minutes/Week): 60   Goals Addressed             This Visit's Progress    Patient Stated       02/08/2023, no goals       Depression  Screen    02/08/2023   11:47 AM 04/14/2021   11:01 AM 03/03/2021    8:31 AM 12/06/2019    9:10 AM 03/29/2018    8:22 AM 10/11/2017    1:09 PM 01/21/2015    8:20 AM  PHQ 2/9 Scores  PHQ - 2 Score 0 0 0 0 0 0 0  PHQ- 9 Score 0          Fall Risk    02/08/2023   11:47 AM 04/14/2021   11:00 AM 12/06/2019    9:10 AM 03/29/2018    8:22 AM 10/11/2017    1:09 PM  Fall Risk   Falls in the past year? 0 0 0 No No  Number falls in past yr: 0 0     Injury with Fall? 0 0     Risk for fall due to : Medication side effect No Fall Risks     Follow up Falls prevention discussed;Education provided;Falls evaluation completed Falls evaluation completed       FALL RISK PREVENTION PERTAINING TO THE HOME:  Any stairs in or around the home? Yes  If so, are there any without handrails? No  Home free of loose throw rugs in walkways, pet beds, electrical cords, etc? Yes  Adequate lighting in your home to reduce risk of falls? Yes   ASSISTIVE DEVICES UTILIZED TO PREVENT FALLS:  Life alert? No  Use of a cane, walker or w/c? No  Grab bars in the bathroom? No  Shower chair or bench in shower? No  Elevated toilet seat or a handicapped toilet? Yes   TIMED UP AND GO:  Was the test performed? No .      Cognitive Function:        02/08/2023   11:50 AM  6CIT Screen  What Year? 0 points  What month? 0 points  What time? 0 points  Count back  from 20 0 points  Months in reverse 2 points  Repeat phrase 0 points  Total Score 2 points    Immunizations Immunization History  Administered Date(s) Administered   Fluad Quad(high Dose 65+) 06/21/2022   Influenza,inj,Quad PF,6+ Mos 08/06/2013, 05/24/2016, 06/20/2018, 09/18/2020   PFIZER(Purple Top)SARS-COV-2 Vaccination 04/09/2020, 05/10/2020   Pneumococcal Polysaccharide-23 08/06/2013, 03/03/2021   Tdap 03/13/2014   Zoster Recombinat (Shingrix) 04/14/2021, 06/17/2021    TDAP status: Up to date  Flu Vaccine status: Up to date  Pneumococcal vaccine  status: Up to date  Covid-19 vaccine status: Completed vaccines  Qualifies for Shingles Vaccine? Yes   Zostavax completed Yes   Shingrix Completed?: Yes  Screening Tests Health Maintenance  Topic Date Due   Medicare Annual Wellness (AWV)  Never done   OPHTHALMOLOGY EXAM  12/20/2018   Diabetic kidney evaluation - Urine ACR  02/26/2020   MAMMOGRAM  04/11/2020   Pneumonia Vaccine 61+ Years old (2 of 2 - PCV) 03/03/2022   COVID-19 Vaccine (3 - 2023-24 season) 05/14/2022   INFLUENZA VACCINE  04/14/2023   FOOT EXAM  06/22/2023   HEMOGLOBIN A1C  06/30/2023   Diabetic kidney evaluation - eGFR measurement  12/29/2023   DTaP/Tdap/Td (2 - Td or Tdap) 03/13/2024   Fecal DNA (Cologuard)  11/19/2024   DEXA SCAN  Completed   Hepatitis C Screening  Completed   Zoster Vaccines- Shingrix  Completed   HPV VACCINES  Aged Out   Colonoscopy  Discontinued    Health Maintenance  Health Maintenance Due  Topic Date Due   Medicare Annual Wellness (AWV)  Never done   OPHTHALMOLOGY EXAM  12/20/2018   Diabetic kidney evaluation - Urine ACR  02/26/2020   MAMMOGRAM  04/11/2020   Pneumonia Vaccine 46+ Years old (2 of 2 - PCV) 03/03/2022   COVID-19 Vaccine (3 - 2023-24 season) 05/14/2022    Colorectal cancer screening: Type of screening: Cologuard. Completed 11/19/2021. Repeat every 3 years  Mammogram status: Ordered today. Pt provided with contact info and advised to call to schedule appt.   Bone Density status: Completed 07/21/2016.   Lung Cancer Screening: (Low Dose CT Chest recommended if Age 85-80 years, 30 pack-year currently smoking OR have quit w/in 15years.) does not qualify.   Lung Cancer Screening Referral: no  Additional Screening:  Hepatitis C Screening: does qualify; Completed 07/19/2016  Vision Screening: Recommended annual ophthalmology exams for early detection of glaucoma and other disorders of the eye. Is the patient up to date with their annual eye exam?  No  Who is the  provider or what is the name of the office in which the patient attends annual eye exams? [facility] If pt is not established with a provider, would they like to be referred to a provider to establish care? No .   Dental Screening: Recommended annual dental exams for proper oral hygiene  Community Resource Referral / Chronic Care Management: CRR required this visit?  No   CCM required this visit?  No      Plan:     I have personally reviewed and noted the following in the patient's chart:   Medical and social history Use of alcohol, tobacco or illicit drugs  Current medications and supplements including opioid prescriptions. Patient is not currently taking opioid prescriptions. Functional ability and status Nutritional status Physical activity Advanced directives List of other physicians Hospitalizations, surgeries, and ER visits in previous 12 months Vitals Screenings to include cognitive, depression, and falls Referrals and appointments  In addition, I have  reviewed and discussed with patient certain preventive protocols, quality metrics, and best practice recommendations. A written personalized care plan for preventive services as well as general preventive health recommendations were provided to patient.     Barb Merino, LPN   [id number]   Nurse Notes: none  Due to this being a virtual visit, the after visit summary with patients personalized plan was offered to patient via mail or my-chart.  to pick up at office at next visit

## 2023-02-08 NOTE — Patient Instructions (Signed)
Kathy [name] , Thank you for taking time to come for your Medicare Wellness Visit. I appreciate your ongoing commitment to your health goals. Please review the following plan we discussed and let me know if I can assist you in the future.   These are the goals we discussed:  Goals      Patient Stated     02/08/2023, no goals        This is a list of the screening recommended for you and due dates:  Health Maintenance  Topic Date Due   Eye exam for diabetics  12/20/2018   Yearly kidney health urinalysis for diabetes  02/26/2020   Mammogram  04/11/2020   Pneumonia Vaccine (2 of 2 - PCV) 03/03/2022   COVID-19 Vaccine (3 - 2023-24 season) 05/14/2022   Flu Shot  04/14/2023   Complete foot exam   06/22/2023   Hemoglobin A1C  06/30/2023   Yearly kidney function blood test for diabetes  12/29/2023   Medicare Annual Wellness Visit  02/08/2024   DTaP/Tdap/Td vaccine (2 - Td or Tdap) 03/13/2024   Cologuard (Stool DNA test)  11/19/2024   DEXA scan (bone density measurement)  Completed   Hepatitis C Screening  Completed   Zoster (Shingles) Vaccine  Completed   HPV Vaccine  Aged Out   Colon Cancer Screening  Discontinued    Advanced directives: Advance directive discussed with you today.   Conditions/risks identified: none  Next appointment: Follow up in one year for your annual wellness visit    Preventive Care 65 Years and Older, Female Preventive care refers to lifestyle choices and visits with your health care provider that can promote health and wellness. What does preventive care include? A yearly physical exam. This is also called an annual well check. Dental exams once or twice a year. Routine eye exams. Ask your health care provider how often you should have your eyes checked. Personal lifestyle choices, including: Daily care of your teeth and gums. Regular physical activity. Eating a healthy diet. Avoiding tobacco and drug use. Limiting alcohol use. Practicing safe  sex. Taking low-dose aspirin every day. Taking vitamin and mineral supplements as recommended by your health care provider. What happens during an annual well check? The services and screenings done by your health care provider during your annual well check will depend on your age, overall health, lifestyle risk factors, and family history of disease. Counseling  Your health care provider may ask you questions about your: Alcohol use. Tobacco use. Drug use. Emotional well-being. Home and relationship well-being. Sexual activity. Eating habits. History of falls. Memory and ability to understand (cognition). Work and work Astronomer. Reproductive health. Screening  You may have the following tests or measurements: Height, weight, and BMI. Blood pressure. Lipid and cholesterol levels. These may be checked every 5 years, or more frequently if you are over 57 years old. Skin check. Lung cancer screening. You may have this screening every year starting at age 60 if you have a 30-pack-year history of smoking and currently smoke or have quit within the past 15 years. Fecal occult blood test (FOBT) of the stool. You may have this test every year starting at age 32. Flexible sigmoidoscopy or colonoscopy. You may have a sigmoidoscopy every 5 years or a colonoscopy every 10 years starting at age 30. Hepatitis C blood test. Hepatitis B blood test. Sexually transmitted disease (STD) testing. Diabetes screening. This is done by checking your blood sugar (glucose) after you have not eaten for a while (  fasting). You may have this done every 1-3 years. Bone density scan. This is done to screen for osteoporosis. You may have this done starting at age 57. Mammogram. This may be done every 1-2 years. Talk to your health care provider about how often you should have regular mammograms. Talk with your health care provider about your test results, treatment options, and if necessary, the need for more  tests. Vaccines  Your health care provider may recommend certain vaccines, such as: Influenza vaccine. This is recommended every year. Tetanus, diphtheria, and acellular pertussis (Tdap, Td) vaccine. You may need a Td booster every 10 years. Zoster vaccine. You may need this after age 68. Pneumococcal 13-valent conjugate (PCV13) vaccine. One dose is recommended after age 52. Pneumococcal polysaccharide (PPSV23) vaccine. One dose is recommended after age 35. Talk to your health care provider about which screenings and vaccines you need and how often you need them. This information is not intended to replace advice given to you by your health care provider. Make sure you discuss any questions you have with your health care provider. Document Released: 09/26/2015 Document Revised: 05/19/2016 Document Reviewed: 07/01/2015 Elsevier Interactive Patient Education  2017 [employer].  Fall Prevention in the Home Falls can cause injuries. They can happen to people of all ages. There are many things you can do to make your home safe and to help prevent falls. What can I do on the outside of my home? Regularly fix the edges of walkways and driveways and fix any cracks. Remove anything that might make you trip as you walk through a door, such as a raised step or threshold. Trim any bushes or trees on the path to your home. Use bright outdoor lighting. Clear any walking paths of anything that might make someone trip, such as rocks or tools. Regularly check to see if handrails are loose or broken. Make sure that both sides of any steps have handrails. Any raised decks and porches should have guardrails on the edges. Have any leaves, snow, or ice cleared regularly. Use sand or salt on walking paths during winter. Clean up any spills in your garage right away. This includes oil or grease spills. What can I do in the bathroom? Use night lights. Install grab bars by the toilet and in the tub and shower.  Do not use towel bars as grab bars. Use non-skid mats or decals in the tub or shower. If you need to sit down in the shower, use a plastic, non-slip stool. Keep the floor dry. Clean up any water that spills on the floor as soon as it happens. Remove soap buildup in the tub or shower regularly. Attach bath mats securely with double-sided non-slip rug tape. Do not have throw rugs and other things on the floor that can make you trip. What can I do in the bedroom? Use night lights. Make sure that you have a light by your bed that is easy to reach. Do not use any sheets or blankets that are too big for your bed. They should not hang down onto the floor. Have a firm chair that has side arms. You can use this for support while you get dressed. Do not have throw rugs and other things on the floor that can make you trip. What can I do in the kitchen? Clean up any spills right away. Avoid walking on wet floors. Keep items that you use a lot in easy-to-reach places. If you need to reach something above you, use  a strong step stool that has a grab bar. Keep electrical cords out of the way. Do not use floor polish or wax that makes floors slippery. If you must use wax, use non-skid floor wax. Do not have throw rugs and other things on the floor that can make you trip. What can I do with my stairs? Do not leave any items on the stairs. Make sure that there are handrails on both sides of the stairs and use them. Fix handrails that are broken or loose. Make sure that handrails are as long as the stairways. Check any carpeting to make sure that it is firmly attached to the stairs. Fix any carpet that is loose or worn. Avoid having throw rugs at the top or bottom of the stairs. If you do have throw rugs, attach them to the floor with carpet tape. Make sure that you have a light switch at the top of the stairs and the bottom of the stairs. If you do not have them, ask someone to add them for you. What else  can I do to help prevent falls? Wear shoes that: Do not have high heels. Have rubber bottoms. Are comfortable and fit you well. Are closed at the toe. Do not wear sandals. If you use a stepladder: Make sure that it is fully opened. Do not climb a closed stepladder. Make sure that both sides of the stepladder are locked into place. Ask someone to hold it for you, if possible. Clearly mark and make sure that you can see: Any grab bars or handrails. First and last steps. Where the edge of each step is. Use tools that help you move around (mobility aids) if they are needed. These include: Canes. Walkers. Scooters. Crutches. Turn on the lights when you go into a dark area. Replace any light bulbs as soon as they burn out. Set up your furniture so you have a clear path. Avoid moving your furniture around. If any of your floors are uneven, fix them. If there are any pets around you, be aware of where they are. Review your medicines with your doctor. Some medicines can make you feel dizzy. This can increase your chance of falling. Ask your doctor what other things that you can do to help prevent falls. This information is not intended to replace advice given to you by your health care provider. Make sure you discuss any questions you have with your health care provider. Document Released: 06/26/2009 Document Revised: 02/05/2016 Document Reviewed: 10/04/2014 Elsevier Interactive Patient Education  2017 [employer].

## 2023-02-15 ENCOUNTER — Encounter: Payer: Self-pay | Admitting: Medical

## 2023-02-15 ENCOUNTER — Ambulatory Visit (INDEPENDENT_AMBULATORY_CARE_PROVIDER_SITE_OTHER): Payer: Medicare Other | Admitting: Medical

## 2023-02-15 VITALS — BP 128/78 | HR 64 | Temp 97.6°F | Ht 68.0 in | Wt 242.4 lb

## 2023-02-15 DIAGNOSIS — Z23 Encounter for immunization: Secondary | ICD-10-CM

## 2023-02-15 DIAGNOSIS — E785 Hyperlipidemia, unspecified: Secondary | ICD-10-CM

## 2023-02-15 DIAGNOSIS — N1832 Chronic kidney disease, stage 3b: Secondary | ICD-10-CM | POA: Diagnosis not present

## 2023-02-15 DIAGNOSIS — N644 Mastodynia: Secondary | ICD-10-CM | POA: Diagnosis not present

## 2023-02-15 DIAGNOSIS — E1169 Type 2 diabetes mellitus with other specified complication: Secondary | ICD-10-CM

## 2023-02-15 DIAGNOSIS — R2230 Localized swelling, mass and lump, unspecified upper limb: Secondary | ICD-10-CM | POA: Diagnosis not present

## 2023-02-15 DIAGNOSIS — E790 Hyperuricemia without signs of inflammatory arthritis and tophaceous disease: Secondary | ICD-10-CM | POA: Diagnosis not present

## 2023-02-15 DIAGNOSIS — I10 Essential (primary) hypertension: Secondary | ICD-10-CM

## 2023-02-15 DIAGNOSIS — N184 Chronic kidney disease, stage 4 (severe): Secondary | ICD-10-CM | POA: Insufficient documentation

## 2023-02-15 DIAGNOSIS — E118 Type 2 diabetes mellitus with unspecified complications: Secondary | ICD-10-CM

## 2023-02-15 DIAGNOSIS — I480 Paroxysmal atrial fibrillation: Secondary | ICD-10-CM

## 2023-02-15 LAB — CBC WITH DIFFERENTIAL/PLATELET
Basophils Absolute: 0.1 10*3/uL (ref 0.0–0.2)
EOS (ABSOLUTE): 0.3 10*3/uL (ref 0.0–0.4)
Hematocrit: 40.8 % (ref 34.0–46.6)
Hemoglobin: 13.4 g/dL (ref 11.1–15.9)
Immature Granulocytes: 0 %
Lymphocytes Absolute: 2.2 10*3/uL (ref 0.7–3.1)
MCHC: 32.8 g/dL (ref 31.5–35.7)
Monocytes Absolute: 0.6 10*3/uL (ref 0.1–0.9)
Neutrophils Absolute: 4.6 10*3/uL (ref 1.4–7.0)

## 2023-02-15 NOTE — Assessment & Plan Note (Addendum)
I reviewed recent cardiology notes from 12/2022.  Continued on Atenolol 50mg  BID for rate-control, has occasional palpitations.  Continue Eliquis 5mg  BID for anticoagulation.

## 2023-02-15 NOTE — Patient Instructions (Signed)
Expect a phone call about scheduling mammogram

## 2023-02-15 NOTE — Progress Notes (Signed)
Subjective:  Kathy [name] is a 67 y.o. female who presents for Chief Complaint  Patient presents with   Follow-up    Patient states she is having pain and swelling up under her left arm/breast.      Patient Care Team: Detron Carras, Kermit Balo, PA-C as PCP - General (Family Medicine) Christell Constant, MD as PCP - Cardiology (Cardiology) [facility], Konrad Dolores, MD as Attending Physician (Endocrinology) Arita Miss, MD as Attending Physician (Nephrology) [place] Ouch Comer Locket as Physician Assistant (Cardiology) Jonelle Sidle, MD as Consulting Physician (Cardiology) [facility]   Concerns: Here for chronic disease follow up.  HTN - compliant with medications, sees cardiology , just saw them in 12/2022  CKD4 - recent visit in 12/2022, compliant with medication  Diabetes - Sees endo, no particular issues today  She notes issues with left armpit.  Has been having sweating and itching in left armpit.  Feels swelling in that area.  At times painful, radiates across upper chest or lateral chest.  Checks breasts monthly, no new lumps.  No shoulder pain, no injury or fall.    Has had mammogram in a while  Hx/o gout and elevated uric acid - takes allopurinol but wonders if this medication causes her pain in the armpit  No other aggravating or relieving factors.    No other c/o.  Past Medical History:  Diagnosis Date   Atrial fibrillation (HCC) 05/2016   Diabetes mellitus without complication (HCC)    type II   History of Helicobacter pylori infection    Hyperlipidemia    Hypertension    Microalbuminuria 2016   Obesity    Current Outpatient Medications on File Prior to Visit  Medication Sig Dispense Refill   allopurinol (ZYLOPRIM) 100 MG tablet Take 2 tablets (200 mg total) by mouth daily. 180 tablet 3   amLODipine-olmesartan (AZOR) 10-40 MG tablet Take 1 tablet by mouth daily. 90 tablet 3   atenolol (TENORMIN) 50 MG tablet TAKE 1 TABLET(50 MG) BY MOUTH  TWICE DAILY 180 tablet 2   blood glucose meter kit and supplies KIT Dispense based on patient and insurance preference. Use up to four times daily as directed. (FOR ICD-9 250.00, 250.01). 1 each 0   ELIQUIS 5 MG TABS tablet TAKE 1 TABLET(5 MG) BY MOUTH TWICE DAILY 60 tablet 6   FARXIGA 10 MG TABS tablet Take 10 mg by mouth daily.     ferrous sulfate 325 (65 FE) MG EC tablet Take 65 mg by mouth 3 (three) times daily with meals.     glucose blood test strip Contour Next test strips. Use as instructed 100 each 6   hydrALAZINE (APRESOLINE) 10 MG tablet TAKE 1 TABLET BY MOUTH TWICE DAILY 180 tablet 3   Insulin Lispro Prot & Lispro (HUMALOG MIX 75/25 KWIKPEN) (75-25) 100 UNIT/ML Kwikpen Inject 14 Units into the skin daily with breakfast AND 16 Units daily with supper. (Patient taking differently: Inject 14 Units into the skin daily with breakfast AND 18 Units daily with supper.) 30 mL 3   Insulin Pen Needle 32G X 4 MM MISC 1 Device by Does not apply route in the morning and at bedtime. 200 each 3   Multiple Vitamin (MULTIVITAMIN WITH MINERALS) TABS tablet Take 1 tablet by mouth daily.     rosuvastatin (CRESTOR) 20 MG tablet Take 1 tablet (20 mg total) by mouth daily. 90 tablet 3   vitamin C (ASCORBIC ACID) 250 MG tablet Take 250 mg by mouth daily.  No current facility-administered medications on file prior to visit.     The following portions of the patient's history were reviewed and updated as appropriate: allergies, current medications, past family history, past medical history, past social history, past surgical history and problem list.  ROS Otherwise as in subjective above   Objective: BP 128/78   Pulse 64   Temp 97.6 F (36.4 C) (Tympanic)   Ht 5\' 8"  (1.727 m)   Wt 242 lb 6.4 oz (110 kg)   SpO2 98%   BMI 36.86 kg/m   General appearance: alert, no distress, well developed, well nourished Fullness of bilat axilla, but no erytehma or warmth or lymphadnepanty Breast with fullness  mid breast on left at 1 oclock, othewise no other lesions noted, exam chaperoned by nurse Neck: supple, no lymphadenopathy, no thyromegaly, no masses Heart: 2/6 holosystolic murmur, otherwise RRR, normal S1, S2, Lungs: CTA bilaterally, no wheezes, rhonchi, or rales Abdomen: +bs, soft, non tender, non distended, no masses, no hepatomegaly, no splenomegaly Pulses: 2+ radial pulses, 2+ pedal pulses, normal cap refill Ext: 1+ nonpitting bilat ankle edema  Diabetic Foot Exam - Simple   Simple Foot Form Diabetic Foot exam was performed with the following findings: Yes 02/15/2023 10:52 AM  Visual Inspection See comments: Yes Sensation Testing Intact to touch and monofilament testing bilaterally: Yes Pulse Check Posterior Tibialis and Dorsalis pulse intact bilaterally: Yes Comments Callous of right volar great toe      Assessment: Encounter Diagnoses  Name Primary?   Essential hypertension, benign Yes   Type 2 diabetes mellitus with complication, without long-term current use of insulin (HCC)    PAF (paroxysmal atrial fibrillation) (HCC)    Stage 3b chronic kidney disease (HCC)    CKD (chronic kidney disease) stage 4, GFR 15-29 ml/min (HCC)    Hyperlipidemia associated with type 2 diabetes mellitus (HCC)    Elevated blood uric acid level    Axillary fullness    Breast tenderness    Need for pneumococcal 20-valent conjugate vaccination      Plan: Problem List Items Addressed This Visit     Type 2 diabetes mellitus with complication, without long-term current use of insulin (HCC)    I reviewed recent endocrinology notes.  Unfortunately she continues to have worsening glycemic control.  - Declined GLP-1 agonists as she attributes dehydration and low GFR to them in the past but at that visit she agreed to trying mounjaro , cautioned against GI side effects.  Declines CGM technology.  She is on farxiga through nephrology.  They started Mounjaro 2.5 mg weekly and increased Humalog mix to  14 units with Breakfast and 18 units with Supper           Essential hypertension, benign - Primary    Reviewed 12/2022 cardiology notes.  She was continued on Amlodipine-Olmesartan 10-40mg  daily, Atenolol 50mg  BID and Hydralazine 10mg  BID. They deferred continuation of Olmesartan to Nephrology.       PAF (paroxysmal atrial fibrillation) (HCC)     I reviewed recent cardiology notes from 12/2022.  Continued on Atenolol 50mg  BID for rate-control, has occasional palpitations.  Continue Eliquis 5mg  BID for anticoagulation.       Elevated blood uric acid level   Relevant Orders   Uric acid   Hyperlipidemia associated with type 2 diabetes mellitus (HCC)    Continue Crestor 20mg  daily       Relevant Orders   Lipid panel   Hepatic function panel   CKD (chronic kidney disease)  stage 4, GFR 15-29 ml/min (HCC)    I reviewed 12/2022 office note from [facility].  CKD secondary to diabetes, has f/u every 3 months due to severity of renal function.   5 year risk showing 27% and 64% respectivly to progression to dialysis and transplant.  C/t every 3 month follow up      Relevant Orders   CBC with Differential/Platelet   Need for pneumococcal 20-valent conjugate vaccination    Counseled on the pneumococcal vaccine.  Vaccine information sheet given.  Pneumococcal vaccine Prevnar 20 given after consent obtained.       Relevant Orders   Pneumococcal conjugate vaccine 20-valent (Prevnar 20) (Completed)   Breast tenderness    Diagnostic mammogram ordered      Relevant Orders   MM 3D DIAGNOSTIC MAMMOGRAM BILATERAL BREAST   Axillary fullness    Diagnostic mammogram ordered      Relevant Orders   MM 3D DIAGNOSTIC MAMMOGRAM BILATERAL BREAST   RESOLVED: Chronic kidney disease (CKD), stage III (moderate) (HCC)   Relevant Orders   CBC with Differential/Platelet     Follow up: pending labs, mammogram

## 2023-02-15 NOTE — Assessment & Plan Note (Signed)
Reviewed 12/2022 cardiology notes.  She was continued on Amlodipine-Olmesartan 10-40mg  daily, Atenolol 50mg  BID and Hydralazine 10mg  BID. They deferred continuation of Olmesartan to Nephrology.

## 2023-02-15 NOTE — Assessment & Plan Note (Signed)
Diagnostic mammogram ordered.  

## 2023-02-15 NOTE — Assessment & Plan Note (Signed)
I reviewed 12/2022 office note from [facility].  CKD secondary to diabetes, has f/u every 3 months due to severity of renal function.   5 year risk showing 27% and 64% respectivly to progression to dialysis and transplant.  C/t every 3 month follow up

## 2023-02-15 NOTE — Assessment & Plan Note (Signed)
Continue Crestor 20 mg daily. 

## 2023-02-15 NOTE — Assessment & Plan Note (Signed)
I reviewed recent endocrinology notes.  Unfortunately she continues to have worsening glycemic control.  - Declined GLP-1 agonists as she attributes dehydration and low GFR to them in the past but at that visit she agreed to trying mounjaro , cautioned against GI side effects.  Declines CGM technology.  She is on farxiga through nephrology.  They started Mounjaro 2.5 mg weekly and increased Humalog mix to 14 units with Breakfast and 18 units with Supper

## 2023-02-15 NOTE — Assessment & Plan Note (Signed)
Counseled on the pneumococcal vaccine.  Vaccine information sheet given.  Pneumococcal vaccine Prevnar 20 given after consent obtained. ? ?

## 2023-02-16 ENCOUNTER — Other Ambulatory Visit: Payer: Self-pay | Admitting: Medical

## 2023-02-16 LAB — CBC WITH DIFFERENTIAL/PLATELET
Basos: 1 %
Eos: 4 %
Immature Grans (Abs): 0 10*3/uL (ref 0.0–0.1)
Lymphs: 29 %
MCH: 26.1 pg — ABNORMAL LOW (ref 26.6–33.0)
MCV: 80 fL (ref 79–97)
Monocytes: 7 %
Neutrophils: 59 %
Platelets: 295 10*3/uL (ref 150–450)
RBC: 5.13 x10E6/uL (ref 3.77–5.28)
RDW: 14.6 % (ref 11.7–15.4)
WBC: 7.7 10*3/uL (ref 3.4–10.8)

## 2023-02-16 LAB — LIPID PANEL
Chol/HDL Ratio: 1.9 ratio (ref 0.0–4.4)
Cholesterol, Total: 146 mg/dL (ref 100–199)
HDL: 77 mg/dL (ref >39)
LDL Chol Calc (NIH): 54 mg/dL (ref 0–99)
Triglycerides: 81 mg/dL (ref 0–149)
VLDL Cholesterol Cal: 15 mg/dL (ref 5–40)

## 2023-02-16 LAB — HEPATIC FUNCTION PANEL
ALT: 15 IU/L (ref 0–32)
AST: 14 IU/L (ref 0–40)
Albumin: 4.4 g/dL (ref 3.9–4.9)
Alkaline Phosphatase: 102 IU/L (ref 44–121)
Bilirubin Total: 0.3 mg/dL (ref 0.0–1.2)
Bilirubin, Direct: 0.1 mg/dL (ref 0.00–0.40)
Total Protein: 7.7 g/dL (ref 6.0–8.5)

## 2023-02-16 LAB — URIC ACID: Uric Acid: 6 mg/dL (ref 3.0–7.2)

## 2023-02-16 MED ORDER — ROSUVASTATIN CALCIUM 20 MG PO TABS: 20.0000 mg | ORAL_TABLET | Freq: Every day | ORAL | 3 refills | Status: DC

## 2023-02-16 MED ORDER — ALLOPURINOL 100 MG PO TABS: 200.0000 mg | ORAL_TABLET | Freq: Every day | ORAL | 3 refills | Status: DC

## 2023-02-16 MED ORDER — AMLODIPINE-OLMESARTAN 10-40 MG PO TABS: 1.0000 | ORAL_TABLET | Freq: Every day | ORAL | 3 refills | Status: DC

## 2023-02-16 NOTE — Progress Notes (Signed)
Sabrina-I have put in a diagnostic mammogram yesterday.  Who orders this, do they call her or do we call and set this up or does the referral coordinator set this up?  Results: Labs show normal blood counts, cholesterol looks good, liver test are okay, uric acid level still okay.  If you have not heard back about a phone call to schedule mammogram within the next week let me know.  Continue current medications

## 2023-02-17 ENCOUNTER — Other Ambulatory Visit: Payer: Self-pay | Admitting: Medical

## 2023-02-17 DIAGNOSIS — R2230 Localized swelling, mass and lump, unspecified upper limb: Secondary | ICD-10-CM

## 2023-02-17 DIAGNOSIS — N644 Mastodynia: Secondary | ICD-10-CM

## 2023-02-19 ENCOUNTER — Other Ambulatory Visit: Payer: Self-pay | Admitting: Medical

## 2023-02-28 ENCOUNTER — Ambulatory Visit
Admission: RE | Admit: 2023-02-28 | Discharge: 2023-02-28 | Disposition: A | Discharge status: Home / Self Care | Payer: Medicare Other | Source: Ambulatory Visit | Attending: Medical | Admitting: Medical

## 2023-02-28 DIAGNOSIS — N644 Mastodynia: Secondary | ICD-10-CM | POA: Diagnosis not present

## 2023-02-28 DIAGNOSIS — R2230 Localized swelling, mass and lump, unspecified upper limb: Secondary | ICD-10-CM

## 2023-02-28 DIAGNOSIS — R590 Localized enlarged lymph nodes: Secondary | ICD-10-CM | POA: Diagnosis not present

## 2023-04-01 ENCOUNTER — Other Ambulatory Visit: Payer: Self-pay | Admitting: Medical

## 2023-04-01 DIAGNOSIS — R599 Enlarged lymph nodes, unspecified: Secondary | ICD-10-CM

## 2023-04-04 DIAGNOSIS — N184 Chronic kidney disease, stage 4 (severe): Secondary | ICD-10-CM | POA: Diagnosis not present

## 2023-04-04 DIAGNOSIS — I129 Hypertensive chronic kidney disease with stage 1 through stage 4 chronic kidney disease, or unspecified chronic kidney disease: Secondary | ICD-10-CM | POA: Diagnosis not present

## 2023-04-04 DIAGNOSIS — R809 Proteinuria, unspecified: Secondary | ICD-10-CM | POA: Diagnosis not present

## 2023-04-04 DIAGNOSIS — E1122 Type 2 diabetes mellitus with diabetic chronic kidney disease: Secondary | ICD-10-CM | POA: Diagnosis not present

## 2023-04-04 DIAGNOSIS — D631 Anemia in chronic kidney disease: Secondary | ICD-10-CM | POA: Diagnosis not present

## 2023-04-04 DIAGNOSIS — N2581 Secondary hyperparathyroidism of renal origin: Secondary | ICD-10-CM | POA: Diagnosis not present

## 2023-04-05 ENCOUNTER — Other Ambulatory Visit: Payer: Self-pay | Admitting: Student

## 2023-04-05 LAB — BASIC METABOLIC PANEL WITH GFR
BUN: 52 — AB (ref 4–21)
Chloride: 104 (ref 99–108)
Creatinine: 2.5 — AB (ref 0.5–1.1)
Glucose: 152
Potassium: 4.7 meq/L (ref 3.5–5.1)
Sodium: 137 (ref 137–147)

## 2023-04-05 LAB — COMPREHENSIVE METABOLIC PANEL
Albumin: 4.3 (ref 3.5–5.0)
Calcium: 10.5 (ref 8.7–10.7)
eGFR: 21

## 2023-04-11 ENCOUNTER — Encounter: Payer: Self-pay | Admitting: Internal Medicine

## 2023-04-12 ENCOUNTER — Telehealth: Payer: Self-pay | Admitting: Internal Medicine

## 2023-04-12 NOTE — Telephone Encounter (Signed)
Its your green folder

## 2023-04-12 NOTE — Telephone Encounter (Signed)
Pt has a form that needs to be completed for insurance verification

## 2023-04-14 NOTE — Telephone Encounter (Signed)
Form was completed and faxed and mailed a copy

## 2023-06-01 ENCOUNTER — Ambulatory Visit
Admission: RE | Admit: 2023-06-01 | Discharge: 2023-06-01 | Disposition: A | Discharge status: Home / Self Care | Payer: Medicare HMO | Source: Ambulatory Visit | Attending: Medical | Admitting: Medical

## 2023-06-01 DIAGNOSIS — R590 Localized enlarged lymph nodes: Secondary | ICD-10-CM | POA: Diagnosis not present

## 2023-06-01 DIAGNOSIS — R599 Enlarged lymph nodes, unspecified: Secondary | ICD-10-CM

## 2023-06-01 NOTE — Progress Notes (Signed)
The findings from the ultrasound a few months ago seems to have improved.  Radiologist recommends routine annual screening mammography

## 2023-06-06 ENCOUNTER — Encounter: Payer: Self-pay | Admitting: Internal Medicine

## 2023-06-17 ENCOUNTER — Ambulatory Visit: Payer: Medicare HMO | Attending: Student | Admitting: Student

## 2023-06-17 ENCOUNTER — Encounter: Payer: Self-pay | Admitting: Student

## 2023-06-17 VITALS — BP 138/70 | HR 62 | Ht 68.0 in | Wt 239.0 lb

## 2023-06-17 DIAGNOSIS — N184 Chronic kidney disease, stage 4 (severe): Secondary | ICD-10-CM

## 2023-06-17 DIAGNOSIS — Z7901 Long term (current) use of anticoagulants: Secondary | ICD-10-CM | POA: Diagnosis not present

## 2023-06-17 DIAGNOSIS — I480 Paroxysmal atrial fibrillation: Secondary | ICD-10-CM | POA: Diagnosis not present

## 2023-06-17 DIAGNOSIS — I10 Essential (primary) hypertension: Secondary | ICD-10-CM

## 2023-06-17 DIAGNOSIS — E785 Hyperlipidemia, unspecified: Secondary | ICD-10-CM

## 2023-06-17 MED ORDER — APIXABAN 5 MG PO TABS: 5.0000 mg | ORAL_TABLET | Freq: Two times a day (BID) | ORAL | 6 refills | Status: DC

## 2023-06-17 MED ORDER — ATENOLOL 50 MG PO TABS: 50.0000 mg | ORAL_TABLET | Freq: Two times a day (BID) | ORAL | 3 refills | Status: DC

## 2023-06-17 NOTE — Patient Instructions (Signed)
Medication Instructions:  Your physician recommends that you continue on your current medications as directed. Please refer to the Current Medication list given to you today.  *If you need a refill on your cardiac medications before your next appointment, please call your pharmacy*   Lab Work: None If you have labs (blood work) drawn today and your tests are completely normal, you will receive your results only by: MyChart Message (if you have MyChart) OR A paper copy in the mail If you have any lab test that is abnormal or we need to change your treatment, we will call you to review the results.   Testing/Procedures: None   Follow-Up: At [facility], you and your health needs are our priority.  As part of our continuing mission to provide you with exceptional heart care, we have created designated Provider Care Teams.  These Care Teams include your primary Cardiologist (physician) and Advanced Practice Providers (APPs -  Physician Assistants and Nurse Practitioners) who all work together to provide you with the care you need, when you need it.  We recommend signing up for the patient portal called "MyChart".  Sign up information is provided on this After Visit Summary.  MyChart is used to connect with patients for Virtual Visits (Telemedicine).  Patients are able to view lab/test results, encounter notes, upcoming appointments, etc.  Non-urgent messages can be sent to your provider as well.   To learn more about what you can do with MyChart, go to [email or web address].    Your next appointment:   6 month(s)  Provider:   You may see Christell Constant, MD or one of the following Advanced Practice Providers on your designated Care Team:   [place], PA-C  Jacolyn Reedy, [place]     Other Instructions

## 2023-06-17 NOTE — Progress Notes (Signed)
Cardiology Office Note    Date:  06/17/2023  ID:  Kathy [name], Kathy [name] May 03, 1956, MRN [id number] Cardiologist: Christell Constant, MD    History of Present Illness:    Kathy [name] is a 67 y.o. female with past medical history of paroxysmal atrial fibrillation, HTN, HLD, Type II DM, adrenal adenoma and Stage IV CKD who presents to the office today for 61-month follow-up.  She was examined by myself in 12/2022 and denied any recent anginal symptoms at that time. Did report occasional palpitations but no persistent symptoms. She had been restarted on Eliquis for anticoagulation and denied any evidence of active bleeding. She was continued on her current medical therapy at that time including Atenolol 50 mg twice daily, Hydralazine 10 mg twice daily, Olmesartan-Amlodipine (deferred to Nephrology) and Eliquis for anticoagulation.  In talking the patient today, she reports overall doing well since her last office visit. Her biggest complaint is knee pain and she thinks this might be secondary to arthritis as it worsens with activity and has been present for quite some time. Has associated swelling along her knee joints as well. She denies any recent chest pain or dyspnea on exertion. No recent palpitations, orthopnea, PND or pitting edema. She does report difficulty with affording Eliquis as this is now costing her over $130 a month as she is currently in the donut hole. She is also on [place].  Studies Reviewed:   EKG: EKG is not ordered today.  Echocardiogram: 05/2016 Study Conclusions   - Left ventricle: The cavity size was normal. There was mild    concentric hypertrophy. Systolic function was normal. The    estimated ejection fraction was in the range of 55% to 60%. Wall    motion was normal; there were no regional wall motion    abnormalities. Left ventricular diastolic function parameters    were normal.  - Aortic valve: Trileaflet; normal thickness leaflets.  - Aortic  root: The aortic root was normal in size.  - Mitral valve: Structurally normal valve. There was no    regurgitation.  - Left atrium: The atrium was mildly dilated.  - Right ventricle: The cavity size was normal. Wall thickness was    normal. Systolic function was normal.  - Right atrium: The atrium was normal in size.  - Tricuspid valve: There was no regurgitation.  - Pulmonic valve: There was no regurgitation.  - Pulmonary arteries: Systolic pressure was within the normal    range.  - Inferior vena cava: The vessel was normal in size.  - Pericardium, extracardiac: There was no pericardial effusion.    Event Monitor: 06/2020 Patient had a min HR of 50 bpm, max HR of 92 bpm, and avg HR of 67 bpm. Predominant underlying rhythm was sinus rhythm. 1st degree HB is present. Isolated PACs were rare (<1.0%) with rare couplets. Isolated PVCs were rare (<1.0%). No atrial fibrillation burden.   No malignant arrhythmias.  Risk Assessment/Calculations:    CHA2DS2-VASc Score = 4   This indicates a 4.8% annual risk of stroke. The patient's score is based upon: CHF History: 0 HTN History: 1 Diabetes History: 1 Stroke History: 0 Vascular Disease History: 0 Age Score: 1 Gender Score: 1    Physical Exam:   VS:  BP 138/70 (BP Location: Left Arm, Patient Position: Sitting, Cuff Size: Normal)   Pulse 62   Ht 5\' 8"  (1.727 m)   Wt 239 lb (108.4 kg)   SpO2 97%   BMI 36.34 kg/m  Wt Readings from Last 3 Encounters:  06/17/23 239 lb (108.4 kg)  02/15/23 242 lb 6.4 oz (110 kg)  02/08/23 230 lb (104.3 kg)     GEN: Well nourished, well developed female appearing in no acute distress NECK: No JVD; No carotid bruits CARDIAC: RRR, no murmurs, rubs, gallops RESPIRATORY:  Clear to auscultation without rales, wheezing or rhonchi  ABDOMEN: Appears non-distended. No obvious abdominal masses. EXTREMITIES: No clubbing or cyanosis. No pitting edema.  Distal pedal pulses are 2+  bilaterally.   Assessment and Plan:   1. Paroxysmal Atrial Fibrillation/Use of Long-term Anticoagulation - She denies any recent palpitations and is in normal sinus rhythm by examination today. Continue Atenolol 50 mg twice daily for rate-control. - She is on Eliquis 5 mg twice daily for anticoagulation which is the appropriate dose at this time given her age, weight and renal function. Labs in 02/2023 showed her hemoglobin was stable at 13.4 with platelets at 295K. She was provided with patient assistance information for Eliquis today. We could switch to Xarelto in the future if this is covered better by her insurance. She prefers to avoid Coumadin.   2. HTN - BP is at 138/70 during today's visit. Continue Amlodipine-Olmesartan 10-40 mg daily, Hydralazine 10 mg twice daily and Atenolol 50 mg twice daily. Continuation of Olmesartan has been deferred to Nephrology in the setting of her Stage IV CKD.  3. HLD - Labs in 02/2023 showed total cholesterol 146, triglycerides 81, HDL 77 and LDL 54. Continue current medical therapy with Crestor 20 mg daily.  4. Stage 4 CKD - Creatinine was at 2.5 in 03/2023 which is close to her known baseline. She is on Farxiga 10 mg daily for this. Followed by [facility].   Signed, Ellsworth Lennox, PA-C

## 2023-06-27 ENCOUNTER — Encounter: Payer: Self-pay | Admitting: Internal Medicine

## 2023-06-27 ENCOUNTER — Ambulatory Visit (INDEPENDENT_AMBULATORY_CARE_PROVIDER_SITE_OTHER): Payer: Medicare HMO | Admitting: Internal Medicine

## 2023-06-27 VITALS — BP 128/80 | HR 60 | Ht 68.0 in | Wt 240.0 lb

## 2023-06-27 DIAGNOSIS — D3502 Benign neoplasm of left adrenal gland: Secondary | ICD-10-CM | POA: Diagnosis not present

## 2023-06-27 DIAGNOSIS — E1165 Type 2 diabetes mellitus with hyperglycemia: Secondary | ICD-10-CM

## 2023-06-27 DIAGNOSIS — N1832 Chronic kidney disease, stage 3b: Secondary | ICD-10-CM

## 2023-06-27 DIAGNOSIS — E1122 Type 2 diabetes mellitus with diabetic chronic kidney disease: Secondary | ICD-10-CM

## 2023-06-27 DIAGNOSIS — Z7985 Long-term (current) use of injectable non-insulin antidiabetic drugs: Secondary | ICD-10-CM

## 2023-06-27 LAB — POCT GLYCOSYLATED HEMOGLOBIN (HGB A1C): Hemoglobin A1C: 7.8 % — AB (ref 4.0–5.6)

## 2023-06-27 MED ORDER — SEMAGLUTIDE(0.25 OR 0.5MG/DOS) 2 MG/3ML ~~LOC~~ SOPN: 0.5000 mg | PEN_INJECTOR | SUBCUTANEOUS | Status: DC

## 2023-06-27 MED ORDER — INSULIN LISPRO PROT & LISPRO (75-25 MIX) 100 UNIT/ML KWIKPEN: PEN_INJECTOR | SUBCUTANEOUS | 3 refills | Status: DC

## 2023-06-27 MED ORDER — INSULIN PEN NEEDLE 32G X 4 MM MISC: 1.0000 | Freq: Two times a day (BID) | 3 refills | Status: DC

## 2023-06-27 NOTE — Progress Notes (Signed)
Name: Kathy [name]  Age/ Sex: 67 y.o., female   MRN/ DOB: [id number], 11/20/1955     PCP: Genia Del   Reason for Endocrinology Evaluation: Type 2 Diabetes Mellitus  Initial Endocrine Consultative Visit: 05/31/2019    PATIENT IDENTIFIER: [name] is a 67 y.o. female with a past medical history of T2Dm, HTN, A.Fib. The patient has followed with Endocrinology clinic since 05/31/2019 for consultative assistance with management of her diabetes.  DIABETIC HISTORY:  [name] was diagnosed with T2DM in 2001. Kathy [name], Glipizide (Stopped 02/2019), tradjenta, metfoRMIN . Rybelsus started 04/2019 but did not help so she stopped it and restarted metformin, in 05/2019 metformin was stopped again and was advised not to take it due to low GFR. Her hemoglobin A1c has ranged from 6.7% in 2015, peaking at 10.4% in 2020.  Ozempic started 01/2020 but she discontinued due to symptoms of dehydration and the patient attributed low GFR to it  We stopped glipizide and started her on Humalog mix by June 2023 with an A1c of 10.3% Farxiga by nephrology 2023   I have attempted to prescribe [facility] 12/2022  but this was cost prohibitive.  Ozempic restarted 06/2023  ADRENAL HISTORY: Pt was noted to have an incidental finding of a left adrenal adenoma 1.4 cm on MRI from 05/2018 during evaluation of a renal cysts. A CT scan of adrenal ( no contrast due to CKD and pt declined MRI 03/05/2020) showed an indeterminate left adrenal adenoma at 1 cm.    Labs were normal to include aldo, cortisol and metanephrines.   SUBJECTIVE:   During the last visit (12/29/2022): A1c 8.7 %     Today (06/27/2023): Kathy [name] is here for a follow up on diabetes management.  She checks her blood sugars 2  times daily.The patient has  had hypoglycemic episodes since the last clinic visit.  She is symptomatic with these episodes   She continues to follow-up with cardiology for paroxysmal atrial  fibrillation She was seen by Dr. Arrie Aran 10/04/2022  Denies nausea, vomiting  Has chronic constipation but  diarrhea  Denies  palpitations Denies LE edema   She has stiffness  of the knees with associated with soreness   HOME DIABETES REGIMEN:  Humalog mix 14 units QAM  18 units QPM  Mounjaro 2.5 mg weekly- not taking  Farxiga 10 mg daily through nephrology       GLUCOSE Meter: unable to download 67 - 163  mg/dL    DIABETIC COMPLICATIONS: Microvascular complications:  CKDIII Denies: retinopathy , neuropathy  Last eye exam: scheduled 12/2022   Macrovascular complications:    Denies: CAD, PVD, CVA       HISTORY:  Past Medical History:  Past Medical History:  Diagnosis Date   Atrial fibrillation (HCC) 05/2016   Diabetes mellitus without complication (HCC)    type II   History of Helicobacter pylori infection    Hyperlipidemia    Hypertension    Microalbuminuria 2016   Obesity    Past Surgical History:  Past Surgical History:  Procedure Laterality Date   COLONOSCOPY     age 27yo   Social History:  reports that she has never smoked. She has never used smokeless tobacco. She reports that she does not currently use alcohol. She reports that she does not use drugs. Family History:  Family History  Problem Relation Age of Onset   Stroke Mother    Cancer Father        throat  Diabetes Sister    Diabetes Sister    Diabetes Sister    Cancer Sister        breast   Breast cancer Sister    Heart disease Neg Hx      HOME MEDICATIONS: Allergies as of 06/27/2023       Reactions   Xarelto [rivaroxaban]    Vaginal Bleeding        Medication List        Accurate as of June 27, 2023  8:09 AM. If you have any questions, ask your nurse or doctor.          allopurinol 100 MG tablet Commonly known as: ZYLOPRIM Take 2 tablets (200 mg total) by mouth daily.   amLODipine-olmesartan 10-40 MG tablet Commonly known as: AZOR Take 1 tablet by mouth  daily.   apixaban 5 MG Tabs tablet Commonly known as: Eliquis Take 1 tablet (5 mg total) by mouth 2 (two) times daily.   atenolol 50 MG tablet Commonly known as: TENORMIN Take 1 tablet (50 mg total) by mouth 2 (two) times daily.   blood glucose meter kit and supplies Kit Dispense based on patient and insurance preference. Use up to four times daily as directed. (FOR ICD-9 250.00, 250.01).   Farxiga 10 MG Tabs tablet Generic drug: dapagliflozin propanediol Take 10 mg by mouth daily.   ferrous sulfate 325 (65 FE) MG EC tablet Take 65 mg by mouth 3 (three) times daily with meals.   glucose blood test strip Contour Next test strips. Use as instructed   hydrALAZINE 10 MG tablet Commonly known as: APRESOLINE TAKE 1 TABLET BY MOUTH TWICE DAILY   hydrALAZINE 25 MG tablet Commonly known as: APRESOLINE Take 25 mg by mouth 2 (two) times daily.   Insulin Lispro Prot & Lispro (75-25) 100 UNIT/ML Kwikpen Commonly known as: HumaLOG Mix 75/25 KwikPen Inject 14 Units into the skin daily with breakfast AND 16 Units daily with supper. What changed: See the new instructions.   Insulin Pen Needle 32G X 4 MM Misc 1 Device by Does not apply route in the morning and at bedtime.   multivitamin with minerals Tabs tablet Take 1 tablet by mouth daily.   rosuvastatin 20 MG tablet Commonly known as: CRESTOR Take 1 tablet (20 mg total) by mouth daily.   vitamin C 250 MG tablet Commonly known as: ASCORBIC ACID Take 250 mg by mouth daily.         OBJECTIVE:   Vital Signs:BP 128/80 (BP Location: Left Arm, Patient Position: Sitting, Cuff Size: Large)   Pulse 60   Ht 5\' 8"  (1.727 m)   Wt 240 lb (108.9 kg)   SpO2 96%   BMI 36.49 kg/m   Wt Readings from Last 3 Encounters:  06/27/23 240 lb (108.9 kg)  06/17/23 239 lb (108.4 kg)  02/15/23 242 lb 6.4 oz (110 kg)     Exam: General: Pt appears well and is in NAD  Lungs: Clear with good BS bilat with no rales, rhonchi, or wheezes   Heart: RRR   Extremities: Trace pretibial edema.  Neuro: MS is good with appropriate affect, pt is alert and Ox3      DM foot exam: 06/27/2023 The skin of the feet is intact without sores or ulcerations. The pedal pulses are 2+ on right and 2+ on left. The sensation is intact to a screening 5.07, 10 gram monofilament bilaterally    DATA REVIEWED:  Lab Results  Component Value Date   HGBA1C  7.8 (A) 06/27/2023   HGBA1C 8.7 (A) 12/29/2022   HGBA1C 7.6 (A) 06/21/2022    Latest Reference Range & Units 04/05/23 00:00  Sodium 137 - 147  137 (E)  Potassium 3.5 - 5.1 mEq/L 4.7 (E)  Chloride 99 - 108  104 (E)  Glucose  152 (E)  BUN 4 - 21  52 ! (E)  Creatinine 0.5 - 1.1  2.5 ! (E)  Calcium 8.7 - 10.7  10.5 (E)  eGFR  21 (E)  Albumin 3.5 - 5.0  4.3 (E)    Latest Reference Range & Units 12/29/22 08:49  ALDOSTERONE 0.0 - 30.0 ng/dL <0.8  ALDOS/RENIN RATIO 0.0 - 30.0  <.3    CT scan 09/25/2021  Adrenals/Urinary Tract: Stable 1.2 cm left adrenal nodule. No right adrenal nodule is seen.   ASSESSMENT / PLAN / RECOMMENDATIONS:   1) Type 2 Diabetes Mellitus, Sub-optimally  controlled, With CKD IV complications - Most recent A1c of 7.8 Goal A1c < 7.0 %.    -A1c has trended down, but continues to be above goal - Declines CGM technology  - She is on farxiga through nephrology  -I have attempted to prescribe [facility] 12/2022 but this was cost prohibitive, she agreed to try Ozempic again, she is to be on it in the past and discontinued due to dehydration, caution against GI side effects, encouraged hydration -She was provided with a #1 sample pen and patient assistance forms for Ozempic -Will decrease insulin to prevent hypoglycemia, as below   MEDICATIONS: - Start Ozempic 0.25  mg weekly for 6 weeks, then increase to 0.5 mg weekly -change  Humalog mix 12 units with Breakfast and 16 units with Supper        EDUCATION / INSTRUCTIONS: BG monitoring instructions: Patient is  instructed to check her blood sugars 3 times a day, before meals  Call [place] [facility] if: BG persistently < 70 I reviewed the Rule of 15 for the treatment of hypoglycemia in detail with the patient. Literature supplied.  2. Left Adrenal Adenoma :   - Stable imaging (09/2021) from 2021 - No clinical or biochemical evidence of extra hormonal excretion ( labs 12/2022) -  F/U in 6 months    Signed electronically by: Lyndle Herrlich, MD  [facility] Endocrinology  [facility] [address] [place] [place], [place] [address] Phone: [phone] FAX: [phone]   CC: Jac Canavan, PA-C [address] [place] [address] Phone: [phone]  Fax: [phone]  Return to Endocrinology clinic as below: Future Appointments  Date Time Provider Department Center  06/27/2023  8:10 AM Katrena Stehlin, Konrad Dolores, MD [facility] None  02/14/2024 11:45 AM [facility]-ANNUAL WELLNESS VISIT [facility]

## 2023-06-27 NOTE — Patient Instructions (Addendum)
Start Ozempic 0.25 mg once weekly for 6 weeks, than increase to 0.5 mg weekly  Change  Humalog MIX 12 units with Breakfast and 16 units with Supper    HOW TO TREAT LOW BLOOD SUGARS (Blood sugar LESS THAN 70 MG/DL) Please follow the RULE OF 15 for the treatment of hypoglycemia treatment (when your (blood sugars are less than 70 mg/dL)   STEP 1: Take 15 grams of carbohydrates when your blood sugar is low, which includes:  3-4 GLUCOSE TABS  OR 3-4 OZ OF JUICE OR REGULAR SODA OR ONE TUBE OF GLUCOSE GEL    STEP 2: RECHECK blood sugar in 15 MINUTES STEP 3: If your blood sugar is still low at the 15 minute recheck --> then, go back to STEP 1 and treat AGAIN with another 15 grams of carbohydrates.

## 2023-07-04 DIAGNOSIS — N2581 Secondary hyperparathyroidism of renal origin: Secondary | ICD-10-CM | POA: Diagnosis not present

## 2023-07-04 DIAGNOSIS — N184 Chronic kidney disease, stage 4 (severe): Secondary | ICD-10-CM | POA: Diagnosis not present

## 2023-07-04 DIAGNOSIS — E1122 Type 2 diabetes mellitus with diabetic chronic kidney disease: Secondary | ICD-10-CM | POA: Diagnosis not present

## 2023-07-04 DIAGNOSIS — N189 Chronic kidney disease, unspecified: Secondary | ICD-10-CM | POA: Diagnosis not present

## 2023-07-04 DIAGNOSIS — D631 Anemia in chronic kidney disease: Secondary | ICD-10-CM | POA: Diagnosis not present

## 2023-07-04 DIAGNOSIS — I129 Hypertensive chronic kidney disease with stage 1 through stage 4 chronic kidney disease, or unspecified chronic kidney disease: Secondary | ICD-10-CM | POA: Diagnosis not present

## 2023-07-04 DIAGNOSIS — R809 Proteinuria, unspecified: Secondary | ICD-10-CM | POA: Diagnosis not present

## 2023-07-13 ENCOUNTER — Encounter: Payer: Self-pay | Admitting: Nephrology

## 2023-07-22 DIAGNOSIS — N184 Chronic kidney disease, stage 4 (severe): Secondary | ICD-10-CM | POA: Diagnosis not present

## 2023-08-02 ENCOUNTER — Telehealth: Payer: Self-pay

## 2023-08-02 ENCOUNTER — Telehealth: Payer: Self-pay | Admitting: Medical

## 2023-08-02 MED ORDER — SEMAGLUTIDE(0.25 OR 0.5MG/DOS) 2 MG/3ML ~~LOC~~ SOPN: 0.5000 mg | PEN_INJECTOR | SUBCUTANEOUS | 3 refills | Status: DC

## 2023-08-02 NOTE — Telephone Encounter (Signed)
Ozempic sent to pharmacy.

## 2023-08-02 NOTE — Telephone Encounter (Signed)
Kathy [name], see the form from [place] [employer].  I have never seen a form like this.  The insurance company is requesting verification of some of her health conditions.  They should have access to that they already by pulling claims records.  Did she actually signed for this with an electronic signature which could easily be just typed and since is not an actual signature  I just want to make sure we are doing the right thing

## 2023-08-10 ENCOUNTER — Other Ambulatory Visit: Payer: Self-pay | Admitting: Internal Medicine

## 2023-10-03 DIAGNOSIS — N184 Chronic kidney disease, stage 4 (severe): Secondary | ICD-10-CM | POA: Diagnosis not present

## 2023-10-10 DIAGNOSIS — I129 Hypertensive chronic kidney disease with stage 1 through stage 4 chronic kidney disease, or unspecified chronic kidney disease: Secondary | ICD-10-CM | POA: Diagnosis not present

## 2023-10-10 DIAGNOSIS — E1122 Type 2 diabetes mellitus with diabetic chronic kidney disease: Secondary | ICD-10-CM | POA: Diagnosis not present

## 2023-10-10 DIAGNOSIS — N2581 Secondary hyperparathyroidism of renal origin: Secondary | ICD-10-CM | POA: Diagnosis not present

## 2023-10-10 DIAGNOSIS — N184 Chronic kidney disease, stage 4 (severe): Secondary | ICD-10-CM | POA: Diagnosis not present

## 2023-10-10 DIAGNOSIS — N189 Chronic kidney disease, unspecified: Secondary | ICD-10-CM | POA: Diagnosis not present

## 2023-10-10 DIAGNOSIS — D631 Anemia in chronic kidney disease: Secondary | ICD-10-CM | POA: Diagnosis not present

## 2023-11-17 ENCOUNTER — Other Ambulatory Visit: Payer: Self-pay | Admitting: Internal Medicine

## 2023-11-21 ENCOUNTER — Other Ambulatory Visit: Payer: Self-pay | Admitting: Internal Medicine

## 2023-11-28 DIAGNOSIS — N184 Chronic kidney disease, stage 4 (severe): Secondary | ICD-10-CM | POA: Diagnosis not present

## 2023-12-05 DIAGNOSIS — E1129 Type 2 diabetes mellitus with other diabetic kidney complication: Secondary | ICD-10-CM | POA: Diagnosis not present

## 2023-12-05 DIAGNOSIS — N184 Chronic kidney disease, stage 4 (severe): Secondary | ICD-10-CM | POA: Diagnosis not present

## 2023-12-05 DIAGNOSIS — R809 Proteinuria, unspecified: Secondary | ICD-10-CM | POA: Diagnosis not present

## 2023-12-05 DIAGNOSIS — D631 Anemia in chronic kidney disease: Secondary | ICD-10-CM | POA: Diagnosis not present

## 2023-12-05 DIAGNOSIS — N2581 Secondary hyperparathyroidism of renal origin: Secondary | ICD-10-CM | POA: Diagnosis not present

## 2023-12-05 DIAGNOSIS — N189 Chronic kidney disease, unspecified: Secondary | ICD-10-CM | POA: Diagnosis not present

## 2023-12-05 DIAGNOSIS — I129 Hypertensive chronic kidney disease with stage 1 through stage 4 chronic kidney disease, or unspecified chronic kidney disease: Secondary | ICD-10-CM | POA: Diagnosis not present

## 2023-12-05 DIAGNOSIS — E1122 Type 2 diabetes mellitus with diabetic chronic kidney disease: Secondary | ICD-10-CM | POA: Diagnosis not present

## 2023-12-26 ENCOUNTER — Ambulatory Visit (INDEPENDENT_AMBULATORY_CARE_PROVIDER_SITE_OTHER): Payer: Medicare HMO | Admitting: Internal Medicine

## 2023-12-26 VITALS — BP 100/80 | HR 75 | Ht 68.0 in | Wt 235.0 lb

## 2023-12-26 DIAGNOSIS — D3502 Benign neoplasm of left adrenal gland: Secondary | ICD-10-CM

## 2023-12-26 DIAGNOSIS — E1122 Type 2 diabetes mellitus with diabetic chronic kidney disease: Secondary | ICD-10-CM

## 2023-12-26 DIAGNOSIS — N184 Chronic kidney disease, stage 4 (severe): Secondary | ICD-10-CM

## 2023-12-26 DIAGNOSIS — Z794 Long term (current) use of insulin: Secondary | ICD-10-CM

## 2023-12-26 LAB — POCT GLYCOSYLATED HEMOGLOBIN (HGB A1C): Hemoglobin A1C: 6.3 % — AB (ref 4.0–5.6)

## 2023-12-26 MED ORDER — INSULIN PEN NEEDLE 32G X 4 MM MISC: 1.0000 | Freq: Two times a day (BID) | 3 refills | Status: DC

## 2023-12-26 MED ORDER — INSULIN LISPRO PROT & LISPRO (75-25 MIX) 100 UNIT/ML KWIKPEN: PEN_INJECTOR | SUBCUTANEOUS | 3 refills | Status: DC

## 2023-12-26 MED ORDER — SEMAGLUTIDE(0.25 OR 0.5MG/DOS) 2 MG/3ML ~~LOC~~ SOPN
0.5000 mg | PEN_INJECTOR | SUBCUTANEOUS | 3 refills | Status: DC
Start: 2023-12-26 — End: 2024-04-17

## 2023-12-26 NOTE — Patient Instructions (Addendum)
 Continue Ozempic  0.5 mg weekly  Change  Humalog MIX 10 units with Breakfast and 14 units with Supper    HOW TO TREAT LOW BLOOD SUGARS (Blood sugar LESS THAN 70 MG/DL) Please follow the RULE OF 15 for the treatment of hypoglycemia treatment (when your (blood sugars are less than 70 mg/dL)   STEP 1: Take 15 grams of carbohydrates when your blood sugar is low, which includes:  3-4 GLUCOSE TABS  OR 3-4 OZ OF JUICE OR REGULAR SODA OR ONE TUBE OF GLUCOSE GEL    STEP 2: RECHECK blood sugar in 15 MINUTES STEP 3: If your blood sugar is still low at the 15 minute recheck --> then, go back to STEP 1 and treat AGAIN with another 15 grams of carbohydrates.

## 2023-12-26 NOTE — Progress Notes (Signed)
 Name: Kathy [name]  Age/ Sex: 68 y.o., female   MRN/ DOB: [id number], Nov 21, 1955     PCP: Genia Del   Reason for Endocrinology Evaluation: Type 2 Diabetes Mellitus  Initial Endocrine Consultative Visit: 05/31/2019    PATIENT IDENTIFIER: Kathy [name] is a 68 y.o. female with a past medical history of T2Dm, HTN, A.Fib. The patient has followed with Endocrinology clinic since 05/31/2019 for consultative assistance with management of her diabetes.  DIABETIC HISTORY:  Kathy [name] was diagnosed with T2DM in 2001. Kathy [name], Glipizide (Stopped 02/2019), tradjenta, metfoRMIN . Rybelsus started 04/2019 but did not help so she stopped it and restarted metformin, in 05/2019 metformin was stopped again and was advised not to take it due to low GFR. Her hemoglobin A1c has ranged from 6.7% in 2015, peaking at 10.4% in 2020.  Ozempic started 01/2020 but she discontinued due to symptoms of dehydration and the patient attributed low GFR to it  We stopped glipizide and started her on Humalog mix by June 2023 with an A1c of 10.3% Farxiga by nephrology 2023   I have attempted to prescribe [facility] 12/2022  but this was cost prohibitive.  Ozempic restarted 06/2023  ADRENAL HISTORY: Pt was noted to have an incidental finding of a left adrenal adenoma 1.4 cm on MRI from 05/2018 during evaluation of a renal cysts. A CT scan of adrenal ( no contrast due to CKD and pt declined MRI 03/05/2020) showed an indeterminate left adrenal adenoma at 1 cm.    Labs were normal to include aldo, cortisol and metanephrines.   SUBJECTIVE:   During the last visit (06/27/2023): A1c 7.8 %     Today (12/26/2023): Kathy [name] is here for a follow up on diabetes management.  She checks her blood sugars 2  times daily.The patient has had hypoglycemic episodes since the last clinic visit.  She is symptomatic with these episodes   She continues to follow-up with cardiology for paroxysmal atrial  fibrillation She was seen by Dr. Arrie Aran  11/2023   Patient has been noted weight loss since her last visit here Denies nausea, vomiting  Continues with chronic constipation but no  diarrhea  Has occasional palpitations that she attributes to A.Fib     HOME DIABETES REGIMEN:  Humalog mix 12 units QAM  16 units QPM  Ozempic 0.5 mg weekly     GLUCOSE Meter: unable to download 63 - 134  mg/dL    DIABETIC COMPLICATIONS: Microvascular complications:  CKDIII Denies: retinopathy , neuropathy  Last eye exam: scheduled 12/2022   Macrovascular complications:    Denies: CAD, PVD, CVA       HISTORY:  Past Medical History:  Past Medical History:  Diagnosis Date   Atrial fibrillation (HCC) 05/2016   Diabetes mellitus without complication (HCC)    type II   History of Helicobacter pylori infection    Hyperlipidemia    Hypertension    Microalbuminuria 2016   Obesity    Past Surgical History:  Past Surgical History:  Procedure Laterality Date   COLONOSCOPY     age 4yo   Social History:  reports that she has never smoked. She has never used smokeless tobacco. She reports that she does not currently use alcohol. She reports that she does not use drugs. Family History:  Family History  Problem Relation Age of Onset   Stroke Mother    Cancer Father        throat   Diabetes Sister  Diabetes Sister    Diabetes Sister    Cancer Sister        breast   Breast cancer Sister    Heart disease Neg Hx      HOME MEDICATIONS: Allergies as of 12/26/2023       Reactions   Xarelto [rivaroxaban]    Vaginal Bleeding        Medication List        Accurate as of December 26, 2023  8:32 AM. If you have any questions, ask your nurse or doctor.          allopurinol 100 MG tablet Commonly known as: ZYLOPRIM Take 2 tablets (200 mg total) by mouth daily.   amLODipine-olmesartan 10-40 MG tablet Commonly known as: AZOR Take 1 tablet by mouth daily.   apixaban 5 MG  Tabs tablet Commonly known as: Eliquis Take 1 tablet (5 mg total) by mouth 2 (two) times daily.   atenolol 50 MG tablet Commonly known as: TENORMIN Take 1 tablet (50 mg total) by mouth 2 (two) times daily.   blood glucose meter kit and supplies Kit Dispense based on patient and insurance preference. Use up to four times daily as directed. (FOR ICD-9 250.00, 250.01).   Farxiga 10 MG Tabs tablet Generic drug: dapagliflozin propanediol Take 10 mg by mouth daily.   ferrous sulfate 325 (65 FE) MG EC tablet Take 65 mg by mouth 3 (three) times daily with meals.   glucose blood test strip Contour Next test strips. Use as instructed   HumaLOG Mix 75/25 KwikPen (75-25) 100 UNIT/ML KwikPen Generic drug: Insulin Lispro Prot & Lispro INJECT 12 UNITS UNDER THE SKIN DAILY WITH BREAKFAST AND 16 UNITS DAILY WITH SUPPER   hydrALAZINE 10 MG tablet Commonly known as: APRESOLINE TAKE 1 TABLET BY MOUTH TWICE DAILY   hydrALAZINE 25 MG tablet Commonly known as: APRESOLINE Take 25 mg by mouth 2 (two) times daily.   Insulin Pen Needle 32G X 4 MM Misc 1 Device by Does not apply route in the morning and at bedtime.   multivitamin with minerals Tabs tablet Take 1 tablet by mouth daily.   rosuvastatin 20 MG tablet Commonly known as: CRESTOR Take 1 tablet (20 mg total) by mouth daily.   Semaglutide(0.25 or 0.5MG /DOS) 2 MG/3ML Sopn Inject 0.5 mg into the skin once a week.   Ozempic (0.25 or 0.5 MG/DOSE) 2 MG/3ML Sopn Generic drug: Semaglutide(0.25 or 0.5MG /DOS) INJECT 0.5 MG UNDER THE SKIN ONCE A WEEK   vitamin C 250 MG tablet Commonly known as: ASCORBIC ACID Take 250 mg by mouth daily.         OBJECTIVE:   Vital Signs:BP 100/80   Pulse 75   Ht 5\' 8"  (1.727 m)   Wt 235 lb (106.6 kg)   SpO2 99%   BMI 35.73 kg/m   Wt Readings from Last 3 Encounters:  12/26/23 235 lb (106.6 kg)  06/27/23 240 lb (108.9 kg)  06/17/23 239 lb (108.4 kg)     Exam: General: Pt appears well and is  in NAD  Lungs: Clear with good BS bilat with no rales  Heart: RRR   Extremities: Trace pretibial edema.  Neuro: MS is good with appropriate affect, pt is alert and Ox3      DM foot exam: 06/27/2023 The skin of the feet is intact without sores or ulcerations. The pedal pulses are 2+ on right and 2+ on left. The sensation is intact to a screening 5.07, 10 gram monofilament bilaterally  DATA REVIEWED:  Lab Results  Component Value Date   HGBA1C 7.8 (A) 06/27/2023   HGBA1C 8.7 (A) 12/29/2022   HGBA1C 7.6 (A) 06/21/2022   11/28/2023 through nephrology BUN 45 Creatinine 3.37 GFR 14 Calcium 10.7 Albumin 4.2 Albumin/creatinine ratio 2927    Latest Reference Range & Units 12/29/22 08:49  ALDOSTERONE 0.0 - 30.0 ng/dL <0.7  ALDOS/RENIN RATIO 0.0 - 30.0  <.3    CT scan 09/25/2021  Adrenals/Urinary Tract: Stable 1.2 cm left adrenal nodule. No right adrenal nodule is seen.   ASSESSMENT / PLAN / RECOMMENDATIONS:   1) Type 2 Diabetes Mellitus, Optimally  controlled, With CKD IV complications - Most recent A1c of 6.3 Goal A1c < 7.0 %.    -A1c has trended down, but continues to be above goal - Declines CGM technology  - She was on farxiga through nephrology but this was discontinued by nephrology  -I have attempted to prescribe Mounjaro 12/2022 but this was cost prohibitive -She is tolerating Ozempic, I have recommended increasing the dose, but she is skeptical about this at this time - Will decrease insulin due to hypoglycemia as below  MEDICATIONS: - Continue Ozempic  0.5 mg weekly - Change  Humalog mix 10 units with Breakfast and 14 units with Supper    EDUCATION / INSTRUCTIONS: BG monitoring instructions: Patient is instructed to check her blood sugars 3 times a day, before meals  Call [place] [facility] if: BG persistently < 70 I reviewed the Rule of 15 for the treatment of hypoglycemia in detail with the patient. Literature supplied.  2. Left Adrenal Adenoma  :   - Stable imaging (09/2021) from 2021 - No clinical or biochemical evidence of extra hormonal excretion ( labs 12/2022)   F/U in 6 months    Signed electronically by: Natale Bail, MD  [facility] Endocrinology  [facility] [address] [place] [place], [place] [address] Phone: [phone] FAX: [phone]   CC: Claudene Crystal, PA-C [address] [place] [address] Phone: [phone]  Fax: [phone]  Return to Endocrinology clinic as below: Future Appointments  Date Time Provider Department Center  01/20/2024 10:00 AM [facility] 6 [facility]  01/20/2024 10:30 AM [facility] 6 [facility]  01/20/2024 11:00 AM Philipp Brawn, MD [facility]  02/14/2024 11:30 AM P[facility]-ANNUAL WELLNESS VISIT [facility]

## 2024-01-02 DIAGNOSIS — E875 Hyperkalemia: Secondary | ICD-10-CM | POA: Diagnosis not present

## 2024-01-02 DIAGNOSIS — N185 Chronic kidney disease, stage 5: Secondary | ICD-10-CM | POA: Diagnosis not present

## 2024-01-02 DIAGNOSIS — I120 Hypertensive chronic kidney disease with stage 5 chronic kidney disease or end stage renal disease: Secondary | ICD-10-CM | POA: Diagnosis not present

## 2024-01-02 DIAGNOSIS — D631 Anemia in chronic kidney disease: Secondary | ICD-10-CM | POA: Diagnosis not present

## 2024-01-02 DIAGNOSIS — E1122 Type 2 diabetes mellitus with diabetic chronic kidney disease: Secondary | ICD-10-CM | POA: Diagnosis not present

## 2024-01-02 DIAGNOSIS — N2581 Secondary hyperparathyroidism of renal origin: Secondary | ICD-10-CM | POA: Diagnosis not present

## 2024-01-16 ENCOUNTER — Other Ambulatory Visit: Payer: Self-pay

## 2024-01-16 DIAGNOSIS — N184 Chronic kidney disease, stage 4 (severe): Secondary | ICD-10-CM

## 2024-01-18 NOTE — Progress Notes (Addendum)
 Patient ID: Kathy [name], female   DOB: Aug 14, 1956, 68 y.o.   MRN: [id number]  Reason for Consult: New Patient (Initial Visit)   Referred by Cristi Donalds, MD  Subjective:     HPI  Kathy [name] is a 68 y.o. female who presents for evaluation HD access creation.  Past Medical History: 05/2016: Atrial fibrillation (HCC) No date: Diabetes mellitus without complication (HCC)     Comment:  type II No date: History of Helicobacter pylori infection No date: Hyperlipidemia No date: Hypertension 2016: Microalbuminuria No date: Obesity  Family History  Problem Relation Age of Onset   Stroke Mother    Cancer Father        throat   Diabetes Sister    Diabetes Sister    Diabetes Sister    Cancer Sister        breast   Breast cancer Sister    Heart disease Neg Hx    Past Surgical History:  Procedure Laterality Date   COLONOSCOPY     age 44yo    Short Social History:  Social History   Tobacco Use   Smoking status: Never   Smokeless tobacco: Never  Substance Use Topics   Alcohol use: Not Currently    Comment: rarely    Allergies  Allergen Reactions   Xarelto  [Rivaroxaban ]     Vaginal Bleeding    Current Outpatient Medications  Medication Sig Dispense Refill   allopurinol  (ZYLOPRIM ) 100 MG tablet Take 2 tablets (200 mg total) by mouth daily. 180 tablet 3   amLODipine -olmesartan  (AZOR ) 10-40 MG tablet Take 1 tablet by mouth daily. 90 tablet 3   apixaban  (ELIQUIS ) 5 MG TABS tablet Take 1 tablet (5 mg total) by mouth 2 (two) times daily. 60 tablet 6   atenolol  (TENORMIN ) 50 MG tablet Take 1 tablet (50 mg total) by mouth 2 (two) times daily. 180 tablet 3   blood glucose meter kit and supplies KIT Dispense based on patient and insurance preference. Use up to four times daily as directed. (FOR ICD-9 250.00, 250.01). 1 each 0   FARXIGA  10 MG TABS tablet Take 10 mg by mouth daily.     ferrous sulfate 325 (65 FE) MG EC tablet Take 65 mg by mouth 3 (three) times  daily with meals.     glucose blood test strip Contour Next test strips. Use as instructed 100 each 6   hydrALAZINE  (APRESOLINE ) 10 MG tablet TAKE 1 TABLET BY MOUTH TWICE DAILY 180 tablet 3   Insulin  Lispro Prot & Lispro (HUMALOG  MIX 75/25 KWIKPEN) (75-25) 100 UNIT/ML Kwikpen Inject 10 Units into the skin daily before breakfast AND 14 Units daily before supper. 30 mL 3   Insulin  Pen Needle 32G X 4 MM MISC 1 Device by Does not apply route in the morning and at bedtime. 200 each 3   LOKELMA 10 g PACK packet Take 1 packet by mouth daily.     Multiple Vitamin (MULTIVITAMIN WITH MINERALS) TABS tablet Take 1 tablet by mouth daily.     OZEMPIC , 0.25 OR 0.5 MG/DOSE, 2 MG/3ML SOPN INJECT 0.5 MG UNDER THE SKIN ONCE A WEEK 3 mL 3   rosuvastatin  (CRESTOR ) 20 MG tablet Take 1 tablet (20 mg total) by mouth daily. 90 tablet 3   Semaglutide ,0.25 or 0.5MG /DOS, 2 MG/3ML SOPN Inject 0.5 mg into the skin once a week. 9 mL 3   vitamin C (ASCORBIC ACID) 250 MG tablet Take 250 mg by mouth daily.  hydrALAZINE  (APRESOLINE ) 25 MG tablet Take 25 mg by mouth 2 (two) times daily. (Patient not taking: Reported on 01/20/2024)     No current facility-administered medications for this visit.    REVIEW OF SYSTEMS  All other systems were reviewed and are negative     Objective:  Objective   Vitals:   01/20/24 1041  BP: (!) 150/75  Pulse: 72  Resp: 20  Temp: 97.9 F (36.6 C)  TempSrc: Temporal  SpO2: 98%  Weight: 238 lb 8 oz (108.2 kg)  Height: 5\' 8"  (1.727 m)   Body mass index is 36.26 kg/m.  Physical Exam General: no acute distress Cardiac: hemodynamically stable Pulm: normal work of breathing Neuro: alert, no focal deficit Extremities: no edema, cyanosis or wounds Vascular:   Right: palpable brachial, radial  Left: palpable brachial, radial   Data: UE arterial duplex Right Pre-Dialysis Findings:  +-----------------------+----------+--------------------+---------+--------  +  Location               PSV (cm/s)Intralum. Diam. (cm)Waveform  Comments  +-----------------------+----------+--------------------+---------+--------  +  Brachial Antecub. [id number]       0.49                triphasic           +-----------------------+----------+--------------------+---------+--------  +  Radial Art at Wrist    134       0.17                triphasic           +-----------------------+----------+--------------------+---------+--------  +  Ulnar Art at Wrist     121       0.15                triphasic           +-----------------------+----------+--------------------+---------+--------  +     Left Pre-Dialysis Findings:  +-----------------------+----------+--------------------+---------+--------  +  Location              PSV (cm/s)Intralum. Diam. (cm)Waveform  Comments  +-----------------------+----------+--------------------+---------+--------  +  Brachial Antecub. [id number]        0.44                triphasic           +-----------------------+----------+--------------------+---------+--------  +  Radial Art at Wrist    88        0.17                triphasic           +-----------------------+----------+--------------------+---------+--------  +  Ulnar Art at Wrist     73        0.14                triphasic           +-----------------------+----------+--------------------+---------+--------    Vein mapping +-----------------+-------------+----------+--------+  Right Cephalic   Diameter (cm)Depth (cm)Findings  +-----------------+-------------+----------+--------+  Shoulder            0.33        1.56             +-----------------+-------------+----------+--------+  Prox upper arm       0.28        0.91             +-----------------+-------------+----------+--------+  Mid upper arm        0.31        0.97             +-----------------+-------------+----------+--------+  Dist upper arm  0.42         0.86             +-----------------+-------------+----------+--------+  Antecubital fossa    0.65        0.43             +-----------------+-------------+----------+--------+  Prox forearm         0.35        1.03             +-----------------+-------------+----------+--------+  Mid forearm          0.38        0.96             +-----------------+-------------+----------+--------+  Dist forearm         0.27        0.50             +-----------------+-------------+----------+--------+   +-----------------+-------------+----------+--------------+  Right Basilic    Diameter (cm)Depth (cm)   Findings     +-----------------+-------------+----------+--------------+  Shoulder            0.61        1.84     branching     +-----------------+-------------+----------+--------------+  Prox upper arm       0.34        2.09                   +-----------------+-------------+----------+--------------+  Mid upper arm        0.30        2.46                   +-----------------+-------------+----------+--------------+  Dist upper arm       0.33        2.73                   +-----------------+-------------+----------+--------------+  Antecubital fossa    0.26        1.68     branching     +-----------------+-------------+----------+--------------+  Prox forearm         0.19        0.70                   +-----------------+-------------+----------+--------------+  Mid forearm                             not visualized  +-----------------+-------------+----------+--------------+  Distal forearm                          not visualized  +-----------------+-------------+----------+--------------+   +-----------------+-------------+----------+---------+  Left Cephalic    Diameter (cm)Depth (cm)Findings   +-----------------+-------------+----------+---------+  Shoulder            0.22        1.10               +-----------------+-------------+----------+---------+  Prox upper arm       0.17        1.14              +-----------------+-------------+----------+---------+  Mid upper arm        0.20        1.12              +-----------------+-------------+----------+---------+  Dist upper arm       0.20        0.88              +-----------------+-------------+----------+---------+  Antecubital fossa    0.70        0.20              +-----------------+-------------+----------+---------+  Prox forearm         0.17        1.00   branching  +-----------------+-------------+----------+---------+  Mid forearm          0.30        0.86              +-----------------+-------------+----------+---------+  Dist forearm         0.29        0.36              +-----------------+-------------+----------+---------+   +-----------------+-------------+----------+--------------+  Left Basilic     Diameter (cm)Depth (cm)   Findings     +-----------------+-------------+----------+--------------+  Shoulder            0.33        2.75                   +-----------------+-------------+----------+--------------+  Prox upper arm       0.28        2.63                   +-----------------+-------------+----------+--------------+  Mid upper arm        0.29        2.41                   +-----------------+-------------+----------+--------------+  Dist upper arm       0.28        2.08                   +-----------------+-------------+----------+--------------+  Antecubital fossa    0.22        1.60     branching     +-----------------+-------------+----------+--------------+  Prox forearm         0.18        0.34                   +-----------------+-------------+----------+--------------+  Mid forearm                             not visualized  +-----------------+-------------+----------+--------------+  Distal forearm                           not visualized  +-----------------+-------------+----------+--------------+   Note from nephrologist reviewed AVF now, wait on AVG      Assessment/Plan:     Rianna Dicke is a 68 y.o. female with CKD stage V who presents to discuss permanent access creation.  Dominant hand: right Previous access surgeries: None Previous catheters: none Other arm surgeries/injuries: none  The vein mapping suggests there is a suitable left basilic and right cephalic and basilic and we discussed that they are a candidate for left brachiobasilic aVF  The risks an benefits including of access creation were reviewed including: need for additional procedures, need for additional creations, steal, ischemia monomelic neuropathy, failure of access, and bleeding. The patient expressed understand and is willing to proceed.  I explained that I will perform intraoperative vein mapping to confirm the above findings and will determine the most appropriate access to create but with a preoperative plan for left arm brachiobasilic aVF.  She is on Eliquis  for A-fib which will have to be paused for  2 days     Philipp Brawn MD [facility]

## 2024-01-18 NOTE — H&P (View-Only) (Signed)
 Patient ID: Kathy [name], female   DOB: Aug 14, 1956, 68 y.o.   MRN: [id number]  Reason for Consult: New Patient (Initial Visit)   Referred by Cristi Donalds, MD  Subjective:     HPI  Kathy [name] is a 68 y.o. female who presents for evaluation HD access creation.  Past Medical History: 05/2016: Atrial fibrillation (HCC) No date: Diabetes mellitus without complication (HCC)     Comment:  type II No date: History of Helicobacter pylori infection No date: Hyperlipidemia No date: Hypertension 2016: Microalbuminuria No date: Obesity  Family History  Problem Relation Age of Onset   Stroke Mother    Cancer Father        throat   Diabetes Sister    Diabetes Sister    Diabetes Sister    Cancer Sister        breast   Breast cancer Sister    Heart disease Neg Hx    Past Surgical History:  Procedure Laterality Date   COLONOSCOPY     age 44yo    Short Social History:  Social History   Tobacco Use   Smoking status: Never   Smokeless tobacco: Never  Substance Use Topics   Alcohol use: Not Currently    Comment: rarely    Allergies  Allergen Reactions   Xarelto  [Rivaroxaban ]     Vaginal Bleeding    Current Outpatient Medications  Medication Sig Dispense Refill   allopurinol  (ZYLOPRIM ) 100 MG tablet Take 2 tablets (200 mg total) by mouth daily. 180 tablet 3   amLODipine -olmesartan  (AZOR ) 10-40 MG tablet Take 1 tablet by mouth daily. 90 tablet 3   apixaban  (ELIQUIS ) 5 MG TABS tablet Take 1 tablet (5 mg total) by mouth 2 (two) times daily. 60 tablet 6   atenolol  (TENORMIN ) 50 MG tablet Take 1 tablet (50 mg total) by mouth 2 (two) times daily. 180 tablet 3   blood glucose meter kit and supplies KIT Dispense based on patient and insurance preference. Use up to four times daily as directed. (FOR ICD-9 250.00, 250.01). 1 each 0   FARXIGA  10 MG TABS tablet Take 10 mg by mouth daily.     ferrous sulfate 325 (65 FE) MG EC tablet Take 65 mg by mouth 3 (three) times  daily with meals.     glucose blood test strip Contour Next test strips. Use as instructed 100 each 6   hydrALAZINE  (APRESOLINE ) 10 MG tablet TAKE 1 TABLET BY MOUTH TWICE DAILY 180 tablet 3   Insulin  Lispro Prot & Lispro (HUMALOG  MIX 75/25 KWIKPEN) (75-25) 100 UNIT/ML Kwikpen Inject 10 Units into the skin daily before breakfast AND 14 Units daily before supper. 30 mL 3   Insulin  Pen Needle 32G X 4 MM MISC 1 Device by Does not apply route in the morning and at bedtime. 200 each 3   LOKELMA 10 g PACK packet Take 1 packet by mouth daily.     Multiple Vitamin (MULTIVITAMIN WITH MINERALS) TABS tablet Take 1 tablet by mouth daily.     OZEMPIC , 0.25 OR 0.5 MG/DOSE, 2 MG/3ML SOPN INJECT 0.5 MG UNDER THE SKIN ONCE A WEEK 3 mL 3   rosuvastatin  (CRESTOR ) 20 MG tablet Take 1 tablet (20 mg total) by mouth daily. 90 tablet 3   Semaglutide ,0.25 or 0.5MG /DOS, 2 MG/3ML SOPN Inject 0.5 mg into the skin once a week. 9 mL 3   vitamin C (ASCORBIC ACID) 250 MG tablet Take 250 mg by mouth daily.  hydrALAZINE  (APRESOLINE ) 25 MG tablet Take 25 mg by mouth 2 (two) times daily. (Patient not taking: Reported on 01/20/2024)     No current facility-administered medications for this visit.    REVIEW OF SYSTEMS  All other systems were reviewed and are negative     Objective:  Objective   Vitals:   01/20/24 1041  BP: (!) 150/75  Pulse: 72  Resp: 20  Temp: 97.9 F (36.6 C)  TempSrc: Temporal  SpO2: 98%  Weight: 238 lb 8 oz (108.2 kg)  Height: 5\' 8"  (1.727 m)   Body mass index is 36.26 kg/m.  Physical Exam General: no acute distress Cardiac: hemodynamically stable Pulm: normal work of breathing Neuro: alert, no focal deficit Extremities: no edema, cyanosis or wounds Vascular:   Right: palpable brachial, radial  Left: palpable brachial, radial   Data: UE arterial duplex Right Pre-Dialysis Findings:  +-----------------------+----------+--------------------+---------+--------  +  Location               PSV (cm/s)Intralum. Diam. (cm)Waveform  Comments  +-----------------------+----------+--------------------+---------+--------  +  Brachial Antecub. [id number]       0.49                triphasic           +-----------------------+----------+--------------------+---------+--------  +  Radial Art at Wrist    134       0.17                triphasic           +-----------------------+----------+--------------------+---------+--------  +  Ulnar Art at Wrist     121       0.15                triphasic           +-----------------------+----------+--------------------+---------+--------  +     Left Pre-Dialysis Findings:  +-----------------------+----------+--------------------+---------+--------  +  Location              PSV (cm/s)Intralum. Diam. (cm)Waveform  Comments  +-----------------------+----------+--------------------+---------+--------  +  Brachial Antecub. fossa89        0.44                triphasic           +-----------------------+----------+--------------------+---------+--------  +  Radial Art at Wrist    88        0.17                triphasic           +-----------------------+----------+--------------------+---------+--------  +  Ulnar Art at Wrist     73        0.14                triphasic           +-----------------------+----------+--------------------+---------+--------    Vein mapping +-----------------+-------------+----------+--------+  Right Cephalic   Diameter (cm)Depth (cm)Findings  +-----------------+-------------+----------+--------+  Shoulder            0.33        1.56             +-----------------+-------------+----------+--------+  Prox upper arm       0.28        0.91             +-----------------+-------------+----------+--------+  Mid upper arm        0.31        0.97             +-----------------+-------------+----------+--------+  Dist upper arm  0.42         0.86             +-----------------+-------------+----------+--------+  Antecubital fossa    0.65        0.43             +-----------------+-------------+----------+--------+  Prox forearm         0.35        1.03             +-----------------+-------------+----------+--------+  Mid forearm          0.38        0.96             +-----------------+-------------+----------+--------+  Dist forearm         0.27        0.50             +-----------------+-------------+----------+--------+   +-----------------+-------------+----------+--------------+  Right Basilic    Diameter (cm)Depth (cm)   Findings     +-----------------+-------------+----------+--------------+  Shoulder            0.61        1.84     branching     +-----------------+-------------+----------+--------------+  Prox upper arm       0.34        2.09                   +-----------------+-------------+----------+--------------+  Mid upper arm        0.30        2.46                   +-----------------+-------------+----------+--------------+  Dist upper arm       0.33        2.73                   +-----------------+-------------+----------+--------------+  Antecubital fossa    0.26        1.68     branching     +-----------------+-------------+----------+--------------+  Prox forearm         0.19        0.70                   +-----------------+-------------+----------+--------------+  Mid forearm                             not visualized  +-----------------+-------------+----------+--------------+  Distal forearm                          not visualized  +-----------------+-------------+----------+--------------+   +-----------------+-------------+----------+---------+  Left Cephalic    Diameter (cm)Depth (cm)Findings   +-----------------+-------------+----------+---------+  Shoulder            0.22        1.10               +-----------------+-------------+----------+---------+  Prox upper arm       0.17        1.14              +-----------------+-------------+----------+---------+  Mid upper arm        0.20        1.12              +-----------------+-------------+----------+---------+  Dist upper arm       0.20        0.88              +-----------------+-------------+----------+---------+  Antecubital fossa    0.70        0.20              +-----------------+-------------+----------+---------+  Prox forearm         0.17        1.00   branching  +-----------------+-------------+----------+---------+  Mid forearm          0.30        0.86              +-----------------+-------------+----------+---------+  Dist forearm         0.29        0.36              +-----------------+-------------+----------+---------+   +-----------------+-------------+----------+--------------+  Left Basilic     Diameter (cm)Depth (cm)   Findings     +-----------------+-------------+----------+--------------+  Shoulder            0.33        2.75                   +-----------------+-------------+----------+--------------+  Prox upper arm       0.28        2.63                   +-----------------+-------------+----------+--------------+  Mid upper arm        0.29        2.41                   +-----------------+-------------+----------+--------------+  Dist upper arm       0.28        2.08                   +-----------------+-------------+----------+--------------+  Antecubital fossa    0.22        1.60     branching     +-----------------+-------------+----------+--------------+  Prox forearm         0.18        0.34                   +-----------------+-------------+----------+--------------+  Mid forearm                             not visualized  +-----------------+-------------+----------+--------------+  Distal forearm                           not visualized  +-----------------+-------------+----------+--------------+   Note from nephrologist reviewed AVF now, wait on AVG      Assessment/Plan:     Rianna Dicke is a 68 y.o. female with CKD stage V who presents to discuss permanent access creation.  Dominant hand: right Previous access surgeries: None Previous catheters: none Other arm surgeries/injuries: none  The vein mapping suggests there is a suitable left basilic and right cephalic and basilic and we discussed that they are a candidate for left brachiobasilic aVF  The risks an benefits including of access creation were reviewed including: need for additional procedures, need for additional creations, steal, ischemia monomelic neuropathy, failure of access, and bleeding. The patient expressed understand and is willing to proceed.  I explained that I will perform intraoperative vein mapping to confirm the above findings and will determine the most appropriate access to create but with a preoperative plan for left arm brachiobasilic aVF.  She is on Eliquis  for A-fib which will have to be paused for  2 days     Philipp Brawn MD [facility]

## 2024-01-20 ENCOUNTER — Other Ambulatory Visit: Payer: Self-pay

## 2024-01-20 ENCOUNTER — Encounter: Payer: Self-pay | Admitting: Vascular Surgery

## 2024-01-20 ENCOUNTER — Ambulatory Visit: Attending: Vascular Surgery | Admitting: Vascular Surgery

## 2024-01-20 ENCOUNTER — Ambulatory Visit (HOSPITAL_COMMUNITY)
Admission: RE | Admit: 2024-01-20 | Discharge: 2024-01-20 | Disposition: A | Discharge status: Home / Self Care | Source: Ambulatory Visit | Attending: Vascular Surgery | Admitting: Vascular Surgery

## 2024-01-20 ENCOUNTER — Ambulatory Visit (HOSPITAL_BASED_OUTPATIENT_CLINIC_OR_DEPARTMENT_OTHER)
Admission: RE | Admit: 2024-01-20 | Discharge: 2024-01-20 | Disposition: A | Discharge status: Home / Self Care | Source: Ambulatory Visit | Attending: Vascular Surgery | Admitting: Vascular Surgery

## 2024-01-20 VITALS — BP 150/75 | HR 72 | Temp 97.9°F | Resp 20 | Ht 68.0 in | Wt 238.5 lb

## 2024-01-20 DIAGNOSIS — N184 Chronic kidney disease, stage 4 (severe): Secondary | ICD-10-CM | POA: Diagnosis not present

## 2024-01-20 DIAGNOSIS — N185 Chronic kidney disease, stage 5: Secondary | ICD-10-CM

## 2024-01-20 DIAGNOSIS — N186 End stage renal disease: Secondary | ICD-10-CM

## 2024-01-31 ENCOUNTER — Encounter (HOSPITAL_COMMUNITY): Payer: Self-pay | Admitting: Vascular Surgery

## 2024-01-31 ENCOUNTER — Other Ambulatory Visit: Payer: Self-pay

## 2024-01-31 NOTE — Progress Notes (Addendum)
 SDW CALL  Patient was given pre-op  instructions over the phone. The opportunity was given for the patient to ask questions. No further questions asked. Patient verbalized understanding of instructions given.   PCP - Nelda Balsam PA-C Cardiologist - Mahesh Chandrasekhar,MD  PPM/ICD - denies Device Orders -  Rep Notified -   Chest x-ray - na EKG - DOS Stress Test - denies ECHO - 05/31/16 Cardiac Cath - denies  Sleep Study - denies CPAP -   Fasting Blood Sugar - 90-100 Checks Blood Sugar 2 times a day  Blood Thinner Instructions: per Dr. Susi Eric- take last dose of Eliquis  5/18. Pt stated she last took Eliquis  5/18. Aspirin  Instructions:na  ERAS Protcol -no PRE-SURGERY Ensure or G2-   COVID TEST- na   Anesthesia review: yes. -last took GLP-1 agonist 5/16. Hx afib. LOV with cardiologist was 06/17/23.  Patient denies shortness of breath, fever, cough and chest pain over the phone call  WHAT DO I DO ABOUT MY DIABETES MEDICATION?  Hold Ozempic  7 days prior to surgery. Pt  states she last took Ozempic  5/16. She said she did not get her letter from Dr. Fulton Job until after she took her Ozempic  on Friday.  THE NIGHT BEFORE SURGERY, take 10 units (70% of your normal dose)of Lispro(Humalog  Mix 75/25) insulin .    THE MORNING OF SURGERY, DO NOT take Insulin  Lispro Prot & Lispro (HUMALOG  MIX 75/25 KWIKPEN) insulin .  HOW TO MANAGE YOUR DIABETES BEFORE AND AFTER SURGERY  Why is it important to control my blood sugar before and after surgery? Improving blood sugar levels before and after surgery helps healing and can limit problems. A way of improving blood sugar control is eating a healthy diet by:  Eating less sugar and carbohydrates  Increasing activity/exercise  Talking with your doctor about reaching your blood sugar goals High blood sugars (greater than 180 mg/dL) can raise your risk of infections and slow your   Check your blood sugar the morning of your surgery when you wake  up and every 2 hours until you get to the Short Stay unit.  If your blood sugar is less than 70 mg/dL, you will need to treat for low blood sugar: Do not take insulin . Treat a low blood sugar (less than 70 mg/dL) with  cup of clear juice (cranberry or apple), 4 glucose tablets, OR glucose gel. Recheck blood sugar in 15 minutes after treatment (to make sure it is greater than 70 mg/dL). If your blood sugar is not greater than 70 mg/dL on recheck, call [phone] for further instructions. Report your blood sugar to the short stay nurse when you get to Short Stay.   Oral Hygiene is also important to reduce your risk of infection.  Remember - BRUSH YOUR TEETH THE MORNING OF SURGERY WITH YOUR REGULAR TOOTHPASTE

## 2024-01-31 NOTE — Anesthesia Preprocedure Evaluation (Addendum)
 Anesthesia Evaluation  Patient identified by MRN, date of birth, ID band Patient awake    Reviewed: Allergy & Precautions, NPO status , Patient's Chart, lab work & pertinent test results  Airway Mallampati: II  TM Distance: >3 FB Neck ROM: Full    Dental  (+) Dental Advisory Given   Pulmonary neg pulmonary ROS   breath sounds clear to auscultation       Cardiovascular hypertension, Pt. on medications and Pt. on home beta blockers + CAD   Rhythm:Regular Rate:Normal     Neuro/Psych negative neurological ROS     GI/Hepatic Neg liver ROS,GERD  ,,  Endo/Other  diabetes, Type 2    Renal/GU Renal disease     Musculoskeletal   Abdominal   Peds  Hematology  (+) Blood dyscrasia, anemia   Anesthesia Other Findings   Reproductive/Obstetrics                             Anesthesia Physical Anesthesia Plan  ASA: 3  Anesthesia Plan: General   Post-op Pain Management: Ofirmev IV (intra-op)*   Induction: Intravenous and Rapid sequence  PONV Risk Score and Plan: 3 and Dexamethasone, Ondansetron  and Treatment may vary due to age or medical condition  Airway Management Planned: Oral ETT  Additional Equipment:   Intra-op Plan:   Post-operative Plan: Extubation in OR  Informed Consent: I have reviewed the patients History and Physical, chart, labs and discussed the procedure including the risks, benefits and alternatives for the proposed anesthesia with the patient or authorized representative who has indicated his/her understanding and acceptance.     Dental advisory given  Plan Discussed with: CRNA  Anesthesia Plan Comments: (PAT note by Rudy Costain, PA-C: 68 year old female follows with cardiology for history ofparoxysmal atrial fibrillation on Eliquis , HTN, HLD.  Echo 2017 showed EF 55 to 60%, normal RV function, no significant valvular abnormalities.  Event monitor 2021 showed  predominant underlying rhythm to be sinus, rare PACs and PVCs, no atrial fibrillation burden.  Last seen by Woodfin Hays, PA-C on 06/17/2023, stable at that time, no changes to management.  Follows with endocrinology for history of IDDM 2 and left adrenal adenoma (stable, no clinical or biochemical evidence of extra, no excretion).  A1c 6.3 on 12/26/2023.  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  Patient reports last dose of Eliquis  01/29/2024.  Last dose GLP-1 agonist 01/27/2024.  She will need day of surgery labs and evaluation.  EKG 12/16/2022: Sinus bradycardia.  Rate 57.  Event monitor 06/2020:  Patient had a min HR of 50 bpm, max HR of 92 bpm, and avg HR of 67 bpm.  Predominant underlying rhythm was sinus rhythm.  1st degree HB is present.  Isolated PACs were rare (<1.0%) with rare couplets.  Isolated PVCs were rare (<1.0%).  No atrial fibrillation burden.   No malignant arrhythmias.  TTE 05/31/2016: - Left ventricle: The cavity size was normal. There was mild  concentric hypertrophy. Systolic function was normal. The  estimated ejection fraction was in the range of 55% to 60%. Wall  motion was normal; there were no regional wall motion  abnormalities. Left ventricular diastolic function parameters  were normal.  - Aortic valve: Trileaflet; normal thickness leaflets.  - Aortic root: The aortic root was normal in size.  - Mitral valve: Structurally normal valve. There was no  regurgitation.  - Left atrium: The atrium was mildly dilated.  - Right ventricle: The cavity size was normal.  Wall thickness was  normal. Systolic function was normal.  - Right atrium: The atrium was normal in size.  - Tricuspid valve: There was no regurgitation.  - Pulmonic valve: There was no regurgitation.  - Pulmonary arteries: Systolic pressure was within the normal  range.  - Inferior vena cava: The vessel was normal in size.  - Pericardium, extracardiac: There was no pericardial effusion.    )        Anesthesia Quick Evaluation

## 2024-01-31 NOTE — Progress Notes (Signed)
 Anesthesia Chart Review: Same day workup  68 year old female follows with cardiology for history of paroxysmal atrial fibrillation on Eliquis , HTN, HLD.  Echo 2017 showed EF 55 to 60%, normal RV function, no significant valvular abnormalities.  Event monitor 2021 showed predominant underlying rhythm to be sinus, rare PACs and PVCs, no atrial fibrillation burden.  Last seen by Woodfin Hays, PA-C on 06/17/2023, stable at that time, no changes to management.  Follows with endocrinology for history of IDDM 2 and left adrenal adenoma (stable, no clinical or biochemical evidence of extra, no excretion).  A1c 6.3 on 12/26/2023.  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  Patient reports last dose of Eliquis  01/29/2024.  Last dose GLP-1 agonist 01/27/2024.  She will need day of surgery labs and evaluation.  EKG 12/16/2022: Sinus bradycardia.  Rate 57.  Event monitor 06/2020: Patient had a min HR of 50 bpm, max HR of 92 bpm, and avg HR of 67 bpm. Predominant underlying rhythm was sinus rhythm. 1st degree HB is present. Isolated PACs were rare (<1.0%) with rare couplets. Isolated PVCs were rare (<1.0%). No atrial fibrillation burden.   No malignant arrhythmias.  TTE 05/31/2016: - Left ventricle: The cavity size was normal. There was mild    concentric hypertrophy. Systolic function was normal. The    estimated ejection fraction was in the range of 55% to 60%. Wall    motion was normal; there were no regional wall motion    abnormalities. Left ventricular diastolic function parameters    were normal.  - Aortic valve: Trileaflet; normal thickness leaflets.  - Aortic root: The aortic root was normal in size.  - Mitral valve: Structurally normal valve. There was no    regurgitation.  - Left atrium: The atrium was mildly dilated.  - Right ventricle: The cavity size was normal. Wall thickness was    normal. Systolic function was normal.  - Right atrium: The atrium was normal in size.  - Tricuspid valve: There was  no regurgitation.  - Pulmonic valve: There was no regurgitation.  - Pulmonary arteries: Systolic pressure was within the normal    range.  - Inferior vena cava: The vessel was normal in size.  - Pericardium, extracardiac: There was no pericardial effusion.     Edilia Gordon [facility]/Anesthesiology Phone [phone] 01/31/2024 2:59 PM

## 2024-02-01 ENCOUNTER — Encounter (HOSPITAL_COMMUNITY): Payer: Self-pay | Admitting: Vascular Surgery

## 2024-02-01 ENCOUNTER — Other Ambulatory Visit: Payer: Self-pay

## 2024-02-01 ENCOUNTER — Other Ambulatory Visit (HOSPITAL_COMMUNITY): Payer: Self-pay

## 2024-02-01 ENCOUNTER — Ambulatory Visit (HOSPITAL_COMMUNITY): Admitting: Physician Assistant

## 2024-02-01 ENCOUNTER — Ambulatory Visit (HOSPITAL_COMMUNITY)
Admission: RE | Admit: 2024-02-01 | Discharge: 2024-02-01 | Disposition: A | Discharge status: Home / Self Care | Attending: Vascular Surgery | Admitting: Vascular Surgery

## 2024-02-01 ENCOUNTER — Ambulatory Visit (HOSPITAL_BASED_OUTPATIENT_CLINIC_OR_DEPARTMENT_OTHER): Admitting: Physician Assistant

## 2024-02-01 ENCOUNTER — Encounter (HOSPITAL_COMMUNITY)
Admission: RE | Disposition: A | Discharge status: Home / Self Care | Payer: Self-pay | Source: Home / Self Care | Attending: Vascular Surgery

## 2024-02-01 DIAGNOSIS — K219 Gastro-esophageal reflux disease without esophagitis: Secondary | ICD-10-CM | POA: Diagnosis not present

## 2024-02-01 DIAGNOSIS — I120 Hypertensive chronic kidney disease with stage 5 chronic kidney disease or end stage renal disease: Secondary | ICD-10-CM

## 2024-02-01 DIAGNOSIS — E669 Obesity, unspecified: Secondary | ICD-10-CM | POA: Diagnosis not present

## 2024-02-01 DIAGNOSIS — Z794 Long term (current) use of insulin: Secondary | ICD-10-CM | POA: Insufficient documentation

## 2024-02-01 DIAGNOSIS — N186 End stage renal disease: Secondary | ICD-10-CM

## 2024-02-01 DIAGNOSIS — I2510 Atherosclerotic heart disease of native coronary artery without angina pectoris: Secondary | ICD-10-CM | POA: Insufficient documentation

## 2024-02-01 DIAGNOSIS — Z7984 Long term (current) use of oral hypoglycemic drugs: Secondary | ICD-10-CM | POA: Diagnosis not present

## 2024-02-01 DIAGNOSIS — I480 Paroxysmal atrial fibrillation: Secondary | ICD-10-CM | POA: Insufficient documentation

## 2024-02-01 DIAGNOSIS — Z6836 Body mass index (BMI) 36.0-36.9, adult: Secondary | ICD-10-CM | POA: Insufficient documentation

## 2024-02-01 DIAGNOSIS — N185 Chronic kidney disease, stage 5: Secondary | ICD-10-CM | POA: Insufficient documentation

## 2024-02-01 DIAGNOSIS — Z7901 Long term (current) use of anticoagulants: Secondary | ICD-10-CM | POA: Insufficient documentation

## 2024-02-01 DIAGNOSIS — Z7985 Long-term (current) use of injectable non-insulin antidiabetic drugs: Secondary | ICD-10-CM | POA: Diagnosis not present

## 2024-02-01 DIAGNOSIS — E1122 Type 2 diabetes mellitus with diabetic chronic kidney disease: Secondary | ICD-10-CM

## 2024-02-01 DIAGNOSIS — D631 Anemia in chronic kidney disease: Secondary | ICD-10-CM | POA: Diagnosis not present

## 2024-02-01 HISTORY — PX: AV FISTULA PLACEMENT: SHX1204

## 2024-02-01 HISTORY — DX: Chronic kidney disease, unspecified: N18.9

## 2024-02-01 LAB — POCT I-STAT, CHEM 8
BUN: 46 mg/dL — ABNORMAL HIGH (ref 8–23)
Calcium, Ion: 1.14 mmol/L — ABNORMAL LOW (ref 1.15–1.40)
Chloride: 107 mmol/L (ref 98–111)
Creatinine, Ser: 3.5 mg/dL — ABNORMAL HIGH (ref 0.44–1.00)
Glucose, Bld: 162 mg/dL — ABNORMAL HIGH (ref 70–99)
HCT: 33 % — ABNORMAL LOW (ref 36.0–46.0)
Hemoglobin: 11.2 g/dL — ABNORMAL LOW (ref 12.0–15.0)
Potassium: 4.2 mmol/L (ref 3.5–5.1)
Sodium: 137 mmol/L (ref 135–145)
TCO2: 22 mmol/L (ref 22–32)

## 2024-02-01 LAB — GLUCOSE, CAPILLARY
Glucose-Capillary: 171 mg/dL — ABNORMAL HIGH (ref 70–99)
Glucose-Capillary: 150 mg/dL — ABNORMAL HIGH (ref 70–99)
Glucose-Capillary: 151 mg/dL — ABNORMAL HIGH (ref 70–99)

## 2024-02-01 SURGERY — ARTERIOVENOUS (AV) FISTULA CREATION
Anesthesia: General | Laterality: Left

## 2024-02-01 MED ORDER — PHENYLEPHRINE HCL-NACL 20-0.9 MG/250ML-% IV SOLN
INTRAVENOUS | Status: DC | PRN
  Administered 2024-02-01: 50 ug/min via INTRAVENOUS

## 2024-02-01 MED ORDER — HEPARIN 6000 UNIT IRRIGATION SOLUTION
Status: DC | PRN
Start: 2024-02-01 — End: 2024-02-01
  Administered 2024-02-01: 1

## 2024-02-01 MED ORDER — HEPARIN 6000 UNIT IRRIGATION SOLUTION
Status: AC
  Filled 2024-02-01: qty 500

## 2024-02-01 MED ORDER — PROPOFOL 10 MG/ML IV BOLUS
INTRAVENOUS | Status: AC
  Filled 2024-02-01: qty 20

## 2024-02-01 MED ORDER — FENTANYL CITRATE (PF) 250 MCG/5ML IJ SOLN
INTRAMUSCULAR | Status: DC | PRN
  Administered 2024-02-01 (×2): 50 ug via INTRAVENOUS

## 2024-02-01 MED ORDER — PROPOFOL 10 MG/ML IV BOLUS
INTRAVENOUS | Status: DC | PRN
  Administered 2024-02-01: 150 mg via INTRAVENOUS

## 2024-02-01 MED ORDER — ONDANSETRON HCL 4 MG/2ML IJ SOLN
INTRAMUSCULAR | Status: AC
  Filled 2024-02-01: qty 2

## 2024-02-01 MED ORDER — FENTANYL CITRATE (PF) 250 MCG/5ML IJ SOLN
INTRAMUSCULAR | Status: AC
  Filled 2024-02-01: qty 5

## 2024-02-01 MED ORDER — DEXAMETHASONE SODIUM PHOSPHATE 10 MG/ML IJ SOLN
INTRAMUSCULAR | Status: AC
  Filled 2024-02-01: qty 1

## 2024-02-01 MED ORDER — 0.9 % SODIUM CHLORIDE (POUR BTL) OPTIME
TOPICAL | Status: DC | PRN
  Administered 2024-02-01: 1000 mL

## 2024-02-01 MED ORDER — SODIUM CHLORIDE 0.9 % IV SOLN: INTRAVENOUS | Status: DC

## 2024-02-01 MED ORDER — HEPARIN SODIUM (PORCINE) 1000 UNIT/ML IJ SOLN
INTRAMUSCULAR | Status: DC | PRN
  Administered 2024-02-01: 3000 [IU] via INTRAVENOUS

## 2024-02-01 MED ORDER — LIDOCAINE 2% (20 MG/ML) 5 ML SYRINGE
INTRAMUSCULAR | Status: AC
  Filled 2024-02-01: qty 5

## 2024-02-01 MED ORDER — OXYCODONE-ACETAMINOPHEN 5-325 MG PO TABS
1.0000 | ORAL_TABLET | Freq: Four times a day (QID) | ORAL | 0 refills | Status: DC | PRN
  Filled 2024-02-01: qty 8, 2d supply, fill #0

## 2024-02-01 MED ORDER — MIDAZOLAM HCL 2 MG/2ML IJ SOLN
INTRAMUSCULAR | Status: DC | PRN
  Administered 2024-02-01: 1 mg via INTRAVENOUS

## 2024-02-01 MED ORDER — INSULIN ASPART 100 UNIT/ML IJ SOLN
0.0000 [IU] | INTRAMUSCULAR | Status: DC | PRN
Start: 2024-02-01 — End: 2024-02-01

## 2024-02-01 MED ORDER — MIDAZOLAM HCL 2 MG/2ML IJ SOLN
INTRAMUSCULAR | Status: AC
  Filled 2024-02-01: qty 2

## 2024-02-01 MED ORDER — CHLORHEXIDINE GLUCONATE 4 % EX SOLN: 60.0000 mL | Freq: Once | CUTANEOUS | Status: DC

## 2024-02-01 MED ORDER — LIDOCAINE 2% (20 MG/ML) 5 ML SYRINGE
INTRAMUSCULAR | Status: DC | PRN
  Administered 2024-02-01: 100 mg via INTRAVENOUS

## 2024-02-01 MED ORDER — SODIUM CHLORIDE 0.9% FLUSH: 3.0000 mL | INTRAVENOUS | Status: DC | PRN

## 2024-02-01 MED ORDER — LIDOCAINE-EPINEPHRINE (PF) 1 %-1:200000 IJ SOLN
INTRAMUSCULAR | Status: AC
  Filled 2024-02-01: qty 30

## 2024-02-01 MED ORDER — CHLORHEXIDINE GLUCONATE 0.12 % MT SOLN
15.0000 mL | Freq: Once | OROMUCOSAL | Status: AC
  Administered 2024-02-01: 15 mL via OROMUCOSAL
  Filled 2024-02-01: qty 15

## 2024-02-01 MED ORDER — SUCCINYLCHOLINE CHLORIDE 200 MG/10ML IV SOSY
PREFILLED_SYRINGE | INTRAVENOUS | Status: DC | PRN
  Administered 2024-02-01: 100 mg via INTRAVENOUS

## 2024-02-01 MED ORDER — EPHEDRINE SULFATE-NACL 50-0.9 MG/10ML-% IV SOSY
PREFILLED_SYRINGE | INTRAVENOUS | Status: DC | PRN
  Administered 2024-02-01: 5 mg via INTRAVENOUS

## 2024-02-01 MED ORDER — LIDOCAINE-EPINEPHRINE (PF) 1 %-1:200000 IJ SOLN
INTRAMUSCULAR | Status: DC | PRN
  Administered 2024-02-01: 6 mL

## 2024-02-01 MED ORDER — ROCURONIUM BROMIDE 10 MG/ML (PF) SYRINGE
PREFILLED_SYRINGE | INTRAVENOUS | Status: AC
  Filled 2024-02-01: qty 10

## 2024-02-01 MED ORDER — DEXAMETHASONE SODIUM PHOSPHATE 10 MG/ML IJ SOLN
INTRAMUSCULAR | Status: DC | PRN
  Administered 2024-02-01: 4 mg via INTRAVENOUS

## 2024-02-01 MED ORDER — CEFAZOLIN SODIUM-DEXTROSE 2-4 GM/100ML-% IV SOLN
2.0000 g | INTRAVENOUS | Status: AC
  Administered 2024-02-01: 2 g via INTRAVENOUS
  Filled 2024-02-01: qty 100

## 2024-02-01 MED ORDER — ONDANSETRON HCL 4 MG/2ML IJ SOLN
INTRAMUSCULAR | Status: DC | PRN
  Administered 2024-02-01: 4 mg via INTRAVENOUS

## 2024-02-01 MED ORDER — PAPAVERINE HCL 30 MG/ML IJ SOLN
INTRAMUSCULAR | Status: AC
  Filled 2024-02-01: qty 2

## 2024-02-01 MED ORDER — HEMOSTATIC AGENTS (NO CHARGE) OPTIME
TOPICAL | Status: DC | PRN
Start: 2024-02-01 — End: 2024-02-01
  Administered 2024-02-01: 1 via TOPICAL

## 2024-02-01 MED ORDER — ORAL CARE MOUTH RINSE: 15.0000 mL | Freq: Once | OROMUCOSAL | Status: AC

## 2024-02-01 SURGICAL SUPPLY — 28 items
ARMBAND PINK RESTRICT EXTREMIT (MISCELLANEOUS) ×1 IMPLANT
BAG COUNTER SPONGE SURGICOUNT (BAG) ×1 IMPLANT
CANISTER SUCTION 3000ML PPV (SUCTIONS) ×1 IMPLANT
CLIP TI MEDIUM 6 (CLIP) ×1 IMPLANT
CLIP TI WIDE RED SMALL 6 (CLIP) ×1 IMPLANT
COVER PROBE W GEL 5X96 (DRAPES) IMPLANT
DERMABOND ADVANCED .7 DNX12 (GAUZE/BANDAGES/DRESSINGS) ×1 IMPLANT
ELECTRODE REM PT RTRN 9FT ADLT (ELECTROSURGICAL) ×1 IMPLANT
GLOVE BIOGEL PI IND STRL 6.5 (GLOVE) IMPLANT
GLOVE BIOGEL PI IND STRL 7.0 (GLOVE) ×1 IMPLANT
GOWN STRL REUS W/ TWL LRG LVL3 (GOWN DISPOSABLE) ×2 IMPLANT
GOWN STRL REUS W/ TWL XL LVL3 (GOWN DISPOSABLE) ×1 IMPLANT
HEMOSTAT SNOW SURGICEL 2X4 (HEMOSTASIS) IMPLANT
INSERT FOGARTY SM (MISCELLANEOUS) IMPLANT
KIT BASIN OR (CUSTOM PROCEDURE TRAY) ×1 IMPLANT
KIT TURNOVER KIT B (KITS) ×1 IMPLANT
NS IRRIG 1000ML POUR BTL (IV SOLUTION) ×1 IMPLANT
PACK CV ACCESS (CUSTOM PROCEDURE TRAY) ×1 IMPLANT
PAD ARMBOARD POSITIONER FOAM (MISCELLANEOUS) ×2 IMPLANT
POWDER SURGICEL 3.0 GRAM (HEMOSTASIS) IMPLANT
SLING ARM FOAM STRAP LRG (SOFTGOODS) IMPLANT
SUT MNCRL AB 4-0 PS2 18 (SUTURE) ×1 IMPLANT
SUT PROLENE 6 0 BV (SUTURE) ×1 IMPLANT
SUT VIC AB 3-0 SH 27X BRD (SUTURE) ×1 IMPLANT
TOWEL GREEN STERILE (TOWEL DISPOSABLE) ×1 IMPLANT
UNDERPAD 30X36 HEAVY ABSORB (UNDERPADS AND DIAPERS) ×1 IMPLANT
VASCULAR TIE MINI RED 18IN STL (MISCELLANEOUS) IMPLANT
WATER STERILE IRR 1000ML POUR (IV SOLUTION) ×1 IMPLANT

## 2024-02-01 NOTE — Anesthesia Procedure Notes (Signed)
 Procedure Name: Intubation Date/Time: 02/01/2024 10:57 AM  Performed by: Fayetta Hoose, CRNAPre-anesthesia Checklist: Patient identified, Emergency Drugs available, Suction available and Patient being monitored Patient Re-evaluated:Patient Re-evaluated prior to induction Oxygen Delivery Method: Circle System Utilized Preoxygenation: Pre-oxygenation with 100% oxygen Induction Type: IV induction Ventilation: Mask ventilation without difficulty Laryngoscope Size: Mac and 3 Grade View: Grade II Tube type: Oral Tube size: 7.5 mm Number of attempts: 1 Airway Equipment and Method: Stylet and Oral airway Placement Confirmation: ETT inserted through vocal cords under direct vision, positive ETCO2 and breath sounds checked- equal and bilateral Secured at: 22 cm Tube secured with: Tape Dental Injury: Teeth and Oropharynx as per pre-operative assessment

## 2024-02-01 NOTE — Op Note (Signed)
    OPERATIVE NOTE  PROCEDURE:   Intraoperative left arm vein mapping left arm brachiocephalic AVF creation  PRE-OPERATIVE DIAGNOSIS: CKD stage V  POST-OPERATIVE DIAGNOSIS: same as above   SURGEON: Philipp Brawn MD  ASSISTANT(S): Maryanna Smart, PA  Given the complexity of the case,  the assistant was necessary in order to expedient the procedure and safely perform the technical aspects of the operation.  The assistant provided traction and countertraction to assist with exposure of the artery and vein.  They also assisted with suture ligation of multiple venous branches.  They played a critical role in the anastomosis. These skills, especially following the Prolene suture for the anastomosis, could not have been adequately performed by a scrub tech assistant.   ANESTHESIA: general  ESTIMATED BLOOD LOSS: 10 cc  FINDING(S): Sufficiently sized left arm cephalic vein throughout its course Sufficiently sized left brachial artery Palpable and doppler thrill in AVF with multiphasic radial artery on completion  SPECIMEN(S):  none  INDICATIONS:   Kathy [name] is a 68 y.o. female with CKD stage V. The patient is not currently on dialysis. They were seen in the office for evaluation of hemodialysis access. The risks an benefits including of access creation were reviewed including: need for additional procedures, need for additional creations, steal, ischemia monomelic neuropathy, failure of access, and bleeding. The patient expressed understand and is willing to proceed.    DESCRIPTION: The patient was brought to the operating room positioned supine on the operating table.  The left arm was prepped and draped in usual sterile fashion.  Timeout was performed and preoperative antibiotics were administered.  The case began with ultrasound mapping of the brachial artery and cephalic vein, which demonstrated sufficient size at the antecubital fossa for arteriovenous fistula.  A  transverse incision was made 1 finger breadth below the elbow creese in the antecubital fossa. The  cephalic vein was isolated for 3 cm in length. Next the aponeurosis was partially released and the brachial artery secured with a vessel loop. The patient was heparinized. The cephalic vein was transected and ligated distally with a 2-0 silk and vascular clip. The vein was dilated and flushed with heparin saline. Vascular clamps were placed proximally and distally on the brachial artery and an approximate 6 mm arteriotomy was created on the brachial artery. This was flushed with heparin saline.  An anastomosis was created in end to side fashion on the brachial artery using running 6-0 Prolene suture. Prior to completing the anastomosis, the vessels were flushed and the suture line was tied down. There was an excellent thrill in the cephalic vein from the anastomosis to the mid upper arm. The patient had a multiphasic radial signal and had an excellent doppler signal in the fistula. The incision was irrigated and hemostasis achieved with cautery and suture. The deeper tissue was closed with 3-0 Vicryl and the skin closed with 4-0 Monocryl. Dermabond was applied to the incisions. The patient was transferred to PACU in stable condition.  COMPLICATIONS: none  CONDITION: stable  Philipp Brawn MD [facility] Phone Number: [phone] 02/01/2024 12:16 PM

## 2024-02-01 NOTE — Transfer of Care (Signed)
 Immediate Anesthesia Transfer of Care Note  Patient: Kathy [name]  Procedure(s) Performed: LEFT ARTERIOVENOUS (AV) FISTULA CREATION (Left)  Patient Location: PACU  Anesthesia Type:General  Level of Consciousness: sedated  Airway & Oxygen Therapy: Patient Spontanous Breathing and Patient connected to nasal cannula oxygen  Post-op Assessment: Report given to RN and Post -op Vital signs reviewed and stable  Post vital signs: Reviewed and stable  Last Vitals:  Vitals Value Taken Time  BP 159/72 02/01/24 1212  Temp 36.6 C 02/01/24 1212  Pulse 62 02/01/24 1214  Resp 22 02/01/24 1215  SpO2 93 % 02/01/24 1214  Vitals shown include unfiled device data.  Last Pain:  Vitals:   02/01/24 0910  TempSrc:   PainSc: 0-No pain         Complications: No notable events documented.

## 2024-02-01 NOTE — Anesthesia Postprocedure Evaluation (Signed)
 Anesthesia Post Note  Patient: Kathy [name]  Procedure(s) Performed: LEFT ARTERIOVENOUS (AV) FISTULA CREATION (Left)     Patient location during evaluation: PACU Anesthesia Type: General Level of consciousness: awake and alert Pain management: pain level controlled Vital Signs Assessment: post-procedure vital signs reviewed and stable Respiratory status: spontaneous breathing, nonlabored ventilation, respiratory function stable and patient connected to nasal cannula oxygen Cardiovascular status: blood pressure returned to baseline and stable Postop Assessment: no apparent nausea or vomiting Anesthetic complications: no  No notable events documented.  Last Vitals:  Vitals:   02/01/24 1245 02/01/24 1300  BP: (!) 158/72 (!) 157/77  Pulse: 62 61  Resp: 20 (!) 22  Temp:  36.5 C  SpO2: 91% 92%    Last Pain:  Vitals:   02/01/24 1300  TempSrc:   PainSc: 0-No pain                 Melvenia Stabs

## 2024-02-01 NOTE — Discharge Instructions (Signed)
   [facility]  Discharge Instructions  AV Fistula or Graft Surgery for Dialysis Access  Please refer to the following instructions for your post-procedure care. Your surgeon or physician assistant will discuss any changes with you.  Activity  You may drive the day following your surgery, if you are comfortable and no longer taking prescription pain medication. Resume full activity as the soreness in your incision resolves.  Bathing/Showering  You may shower after you go home. Keep your incision dry for 48 hours. Do not soak in a bathtub, hot tub, or swim until the incision heals completely. You may not shower if you have a hemodialysis catheter.  Incision Care  Clean your incision with mild soap and water after 48 hours. Pat the area dry with a clean towel. You do not need a bandage unless otherwise instructed. Do not apply any ointments or creams to your incision. You may have skin glue on your incision. Do not peel it off. It will come off on its own in about one week. Your arm may swell a bit after surgery. To reduce swelling use pillows to elevate your arm so it is above your heart. Your doctor will tell you if you need to lightly wrap your arm with an ACE bandage.  Diet  Resume your normal diet. There are not special food restrictions following this procedure. In order to heal from your surgery, it is CRITICAL to get adequate nutrition. Your body requires vitamins, minerals, and protein. Vegetables are the best source of vitamins and minerals. Vegetables also provide the perfect balance of protein. Processed food has little nutritional value, so try to avoid this.  Medications  Resume taking all of your medications. If your incision is causing pain, you may take over-the counter pain relievers such as acetaminophen (Tylenol). If you were prescribed a stronger pain medication, please be aware these medications can cause nausea and constipation. Prevent  nausea by taking the medication with a snack or meal. Avoid constipation by drinking plenty of fluids and eating foods with high amount of fiber, such as fruits, vegetables, and grains.  Do not take Tylenol if you are taking prescription pain medications.  Follow up Your surgeon may want to see you in the office following your access surgery. If so, this will be arranged at the time of your surgery.  Please call us  immediately for any of the following conditions:  Increased pain, redness, drainage (pus) from your incision site Fever of 101 degrees or higher Severe or worsening pain at your incision site Hand pain or numbness.  Reduce your risk of vascular disease:  Stop smoking. If you would like help, call [facility] at 1-800-QUIT-NOW (860-748-7735) or [place] at (787)705-5629  Manage your cholesterol Maintain a desired weight Control your diabetes Keep your blood pressure down  Dialysis  It will take several weeks to several months for your new dialysis access to be ready for use. Your surgeon will determine when it is okay to use it. Your nephrologist will continue to direct your dialysis. You can continue to use your Permcath until your new access is ready for use.   02/01/2024 Kathy [name] [id number] 16-Mar-1956  Surgeon(s): Philipp Brawn, MD  Procedure(s): Creation left brachiocephalic AV fistula  x Do not stick fistula for 12 weeks    If you have any questions, please call the office at [phone].

## 2024-02-01 NOTE — Interval H&P Note (Signed)
 History and Physical Interval Note:  02/01/2024 9:01 AM  Kathy [name]  has presented today for surgery, with the diagnosis of ESRD.  The various methods of treatment have been discussed with the patient and family. After consideration of risks, benefits and other options for treatment, the patient has consented to  Procedure(s): ARTERIOVENOUS (AV) FISTULA CREATION (Left) as a surgical intervention.  The patient's history has been reviewed, patient examined, no change in status, stable for surgery.  I have reviewed the patient's chart and labs.  Questions were answered to the patient's satisfaction.     Philipp Brawn

## 2024-02-02 ENCOUNTER — Encounter (HOSPITAL_COMMUNITY): Payer: Self-pay | Admitting: Vascular Surgery

## 2024-02-14 ENCOUNTER — Other Ambulatory Visit: Payer: Self-pay | Admitting: Medical

## 2024-02-14 NOTE — Telephone Encounter (Signed)
 Patient is overdue for a visit. Please schedule before I can refill medication

## 2024-02-14 NOTE — Telephone Encounter (Signed)
Called left message for pt to schedule an appt.

## 2024-02-14 NOTE — Progress Notes (Unsigned)
 Cardiology Office Note    Date:  02/16/2024  ID:  Karis, Rilling [birth year], MRN [id number] Cardiologist: Jann Melody, MD    History of Present Illness:    Kathy [name] is a 68 y.o. female with past medical history of paroxysmal atrial fibrillation, HTN, HLD, Type II DM, adrenal adenoma and Stage IV CKD who presents to the office today for 50-month follow-up.   She was last examined by myself in 06/2023 and her biggest issue at that time had been knee pain due to arthritis. She was provided with patient assistance information for Eliquis  as this was unaffordable at that time. She was continued on Amlodipine -Olmesartan  10-40 mg daily, Eliquis  5 mg twice daily, Atenolol  50 mg twice daily, Farxiga  10 mg daily, Hydralazine  10 mg twice daily and Crestor  20 mg daily. Continuation of her ARB was deferred to Nephrology due to her Stage IV CKD. In the interim, she did undergo left arm brachiocephalic fistula creation in 01/2024.  In talking with the patient today, she reports overall doing well since her last office visit. She is disappointed that she had to undergo fistula placement but is hopeful she will not have to go on dialysis for a long time. She consumes at least 6-7 bottles of water a day and tries to limit her soda intake. Reports her breathing has been stable and denies any dyspnea on exertion, orthopnea or PND. Does have occasional lower extremity edema which is typically most prominent along her right ankle. Wearing compression stockings today and utilizes these routinely when away from home. She does elevate her legs at home. Denies any recent chest pain. Reports occasional palpitations which feel like a fluttering sensation but they only last for a few seconds and then resolve. Remains on Eliquis  for anticoagulation with no reports of melena, hematochezia or hematuria.  Studies Reviewed:   EKG: EKG is not ordered today.  Echocardiogram: 05/2016 Study Conclusions    - Left ventricle: The cavity size was normal. There was mild    concentric hypertrophy. Systolic function was normal. The    estimated ejection fraction was in the range of 55% to 60%. Wall    motion was normal; there were no regional wall motion    abnormalities. Left ventricular diastolic function parameters    were normal.  - Aortic valve: Trileaflet; normal thickness leaflets.  - Aortic root: The aortic root was normal in size.  - Mitral valve: Structurally normal valve. There was no    regurgitation.  - Left atrium: The atrium was mildly dilated.  - Right ventricle: The cavity size was normal. Wall thickness was    normal. Systolic function was normal.  - Right atrium: The atrium was normal in size.  - Tricuspid valve: There was no regurgitation.  - Pulmonic valve: There was no regurgitation.  - Pulmonary arteries: Systolic pressure was within the normal    range.  - Inferior vena cava: The vessel was normal in size.  - Pericardium, extracardiac: There was no pericardial effusion.   Event Monitor: 06/2020 Patient had a min HR of 50 bpm, max HR of 92 bpm, and avg HR of 67 bpm. Predominant underlying rhythm was sinus rhythm. 1st degree HB is present. Isolated PACs were rare (<1.0%) with rare couplets. Isolated PVCs were rare (<1.0%). No atrial fibrillation burden.   No malignant arrhythmias.  Risk Assessment/Calculations:     HYPERTENSION CONTROL Vitals:   02/16/24 0925 02/16/24 0943  BP: (!) 164/82 (!) 154/80  The patient's blood pressure is elevated above target today. In order to address the patient's elevated BP: Did not adjust as patient had not yet taken her morning medications.       Physical Exam:   VS:  BP (!) 154/80   Pulse 77   Ht 5\' 8"  (1.727 m)   Wt 238 lb 6.4 oz (108.1 kg)   SpO2 96%   BMI 36.25 kg/m    Wt Readings from Last 3 Encounters:  02/16/24 238 lb 6.4 oz (108.1 kg)  02/01/24 238 lb 5.1 oz (108.1 kg)  01/20/24 238 lb 8 oz (108.2  kg)     GEN: Well nourished, well developed female appearing in no acute distress NECK: No JVD; No carotid bruits CARDIAC: RRR, no murmurs, rubs, gallops RESPIRATORY:  Clear to auscultation without rales, wheezing or rhonchi  ABDOMEN: Appears non-distended. No obvious abdominal masses. EXTREMITIES: No clubbing or cyanosis. No pitting edema.  Distal pedal pulses are 2+ bilaterally.   Assessment and Plan:   1. Paroxysmal Atrial Fibrillation - She reports occasional, brief palpitations but no persistent symptoms. Prior monitor showed occasional PAC's and PVC's as well which could be contributing. She is in normal sinus rhythm by examination today. Continue with Atenolol  50 mg twice daily for rate-control. Given the timeframe since her last echocardiogram and due to intermittent lower extremity edema, will recheck an echocardiogram to assess for any structural abnormalities. - Continue Eliquis  5mg  BID for anticoagulation which is the appropriate dose as her only indication for reduced dosing is renal function. No reports of active bleeding.  2. HTN - BP was initially recorded at 164/82, rechecked and at 154/80. She has not yet taken her medications for today. Says that BP is typically well-controlled when checked at other office visits. For now, continue current medical therapy with Amlodipine -Olmesartan  10-40 mg daily (deferred to Nephrology given her CKD), Atenolol  50 mg twice daily and Hydralazine  50 mg twice daily. If BP remains above goal, could further titrate Hydralazine .  3. HLD - LDL was at 54 when checked in 02/2023. Continue current medical therapy with Crestor  20 mg daily.  4. Stage 4-5 CKD - Creatinine was at 3.50 when checked in 01/2024. Followed by [facility] and recently underwent fistula placement.   Signed, Dorma Gash, PA-C

## 2024-02-16 ENCOUNTER — Encounter: Payer: Self-pay | Admitting: Student

## 2024-02-16 ENCOUNTER — Ambulatory Visit: Attending: Student | Admitting: Student

## 2024-02-16 VITALS — BP 154/80 | HR 77 | Ht 68.0 in | Wt 238.4 lb

## 2024-02-16 DIAGNOSIS — I480 Paroxysmal atrial fibrillation: Secondary | ICD-10-CM | POA: Diagnosis not present

## 2024-02-16 DIAGNOSIS — N184 Chronic kidney disease, stage 4 (severe): Secondary | ICD-10-CM | POA: Diagnosis not present

## 2024-02-16 DIAGNOSIS — E785 Hyperlipidemia, unspecified: Secondary | ICD-10-CM | POA: Diagnosis not present

## 2024-02-16 DIAGNOSIS — I10 Essential (primary) hypertension: Secondary | ICD-10-CM | POA: Diagnosis not present

## 2024-02-16 DIAGNOSIS — R600 Localized edema: Secondary | ICD-10-CM

## 2024-02-16 MED ORDER — APIXABAN 5 MG PO TABS: 5.0000 mg | ORAL_TABLET | Freq: Two times a day (BID) | ORAL | 11 refills | Status: DC

## 2024-02-16 NOTE — Patient Instructions (Signed)
 Medication Instructions:  Your physician recommends that you continue on your current medications as directed. Please refer to the Current Medication list given to you today.   *If you need a refill on your cardiac medications before your next appointment, please call your pharmacy*  Lab Work: NONE   If you have labs (blood work) drawn today and your tests are completely normal, you will receive your results only by: MyChart Message (if you have MyChart) OR A paper copy in the mail If you have any lab test that is abnormal or we need to change your treatment, we will call you to review the results.  Testing/Procedures: Your physician has requested that you have an echocardiogram. Echocardiography is a painless test that uses sound waves to create images of your heart. It provides your doctor with information about the size and shape of your heart and how well your heart's chambers and valves are working. This procedure takes approximately one hour. There are no restrictions for this procedure. Please do NOT wear cologne, perfume, aftershave, or lotions (deodorant is allowed). Please arrive 15 minutes prior to your appointment time.  Please note: We ask at that you not bring children with you during ultrasound (echo/ vascular) testing. Due to room size and safety concerns, children are not allowed in the ultrasound rooms during exams. Our front office staff cannot provide observation of children in our lobby area while testing is being conducted. An adult accompanying a patient to their appointment will only be allowed in the ultrasound room at the discretion of the ultrasound technician under special circumstances. We apologize for any inconvenience.   Follow-Up: At [facility], you and your health needs are our priority.  As part of our continuing mission to provide you with exceptional heart care, our providers are all part of one team.  This team includes your primary Cardiologist  (physician) and Advanced Practice Providers or APPs (Physician Assistants and Nurse Practitioners) who all work together to provide you with the care you need, when you need it.  Your next appointment:   6 month(s)  Provider:   You may see Jann Melody, MD or one of the following Advanced Practice Providers on your designated Care Team:   [place], PA-C  Scotesia Othello, [place] Theotis Flake, [place]     We recommend signing up for the patient portal called "MyChart".  Sign up information is provided on this After Visit Summary.  MyChart is used to connect with patients for Virtual Visits (Telemedicine).  Patients are able to view lab/test results, encounter notes, upcoming appointments, etc.  Non-urgent messages can be sent to your provider as well.   To learn more about what you can do with MyChart, go to [email or web address].   Other Instructions Thank you for choosing [facility]!

## 2024-02-20 DIAGNOSIS — N185 Chronic kidney disease, stage 5: Secondary | ICD-10-CM | POA: Diagnosis not present

## 2024-02-27 DIAGNOSIS — N2581 Secondary hyperparathyroidism of renal origin: Secondary | ICD-10-CM | POA: Diagnosis not present

## 2024-02-27 DIAGNOSIS — D631 Anemia in chronic kidney disease: Secondary | ICD-10-CM | POA: Diagnosis not present

## 2024-02-27 DIAGNOSIS — E1122 Type 2 diabetes mellitus with diabetic chronic kidney disease: Secondary | ICD-10-CM | POA: Diagnosis not present

## 2024-02-27 DIAGNOSIS — I129 Hypertensive chronic kidney disease with stage 1 through stage 4 chronic kidney disease, or unspecified chronic kidney disease: Secondary | ICD-10-CM | POA: Diagnosis not present

## 2024-02-27 DIAGNOSIS — N184 Chronic kidney disease, stage 4 (severe): Secondary | ICD-10-CM | POA: Diagnosis not present

## 2024-03-05 ENCOUNTER — Encounter: Payer: Self-pay | Admitting: Medical

## 2024-03-05 ENCOUNTER — Ambulatory Visit: Admitting: Medical

## 2024-03-05 VITALS — BP 134/70 | HR 74 | Ht 68.0 in | Wt 236.4 lb

## 2024-03-05 DIAGNOSIS — Z7185 Encounter for immunization safety counseling: Secondary | ICD-10-CM | POA: Diagnosis not present

## 2024-03-05 DIAGNOSIS — I480 Paroxysmal atrial fibrillation: Secondary | ICD-10-CM | POA: Diagnosis not present

## 2024-03-05 DIAGNOSIS — N185 Chronic kidney disease, stage 5: Secondary | ICD-10-CM

## 2024-03-05 DIAGNOSIS — I10 Essential (primary) hypertension: Secondary | ICD-10-CM

## 2024-03-05 DIAGNOSIS — E790 Hyperuricemia without signs of inflammatory arthritis and tophaceous disease: Secondary | ICD-10-CM | POA: Diagnosis not present

## 2024-03-05 DIAGNOSIS — E1122 Type 2 diabetes mellitus with diabetic chronic kidney disease: Secondary | ICD-10-CM

## 2024-03-05 DIAGNOSIS — R609 Edema, unspecified: Secondary | ICD-10-CM

## 2024-03-05 DIAGNOSIS — E1129 Type 2 diabetes mellitus with other diabetic kidney complication: Secondary | ICD-10-CM | POA: Insufficient documentation

## 2024-03-05 DIAGNOSIS — E1169 Type 2 diabetes mellitus with other specified complication: Secondary | ICD-10-CM | POA: Diagnosis not present

## 2024-03-05 DIAGNOSIS — E785 Hyperlipidemia, unspecified: Secondary | ICD-10-CM

## 2024-03-05 DIAGNOSIS — Z794 Long term (current) use of insulin: Secondary | ICD-10-CM | POA: Diagnosis not present

## 2024-03-05 LAB — LIPID PANEL

## 2024-03-05 MED ORDER — ROSUVASTATIN CALCIUM 10 MG PO TABS: 10.0000 mg | ORAL_TABLET | Freq: Every day | ORAL | 2 refills | Status: DC

## 2024-03-05 NOTE — Patient Instructions (Signed)
 Get an updated Tdap tetanus booster at your pharmacy and ask them to send notification to us   Schedule your yearly diabetic eye exam/eye doctor appointment  We are changing your Crestor  Rosuvastatin  medication from 20mg  down to 10mg  today.  I sent a new prescription for 10mg  dosing to your pharmacy.  This change is due to your decreased kidney function  Continue rest of medications as usual  Wear compression hose daily.   Walk or do some type of exercise daily

## 2024-03-05 NOTE — Progress Notes (Signed)
 Subjective: Chief Complaint  Patient presents with   Medication Management    No issues-   Here for med check.  Patient Care Team: Mearl Olver, Alm RAMAN, PA-C as PCP - General (Family Medicine) Santo Stanly LABOR, MD as PCP - Cardiology (Cardiology) [facility], Donell Cardinal, MD as Attending Physician (Endocrinology) Marlee Bernardino NOVAK, MD as Attending Physician (Nephrology) Johnson Laymon CHRISTELLA RIGGERS as Physician Assistant (Cardiology) Debera Jayson MATSU, MD as Consulting Physician (Cardiology) [facility]  HTN - checks BP every now and then.   Typically SBP 130-140s at home.    Diabetes - sees endocrinology , most recent hgba1c under 7%.  On the 0.5mg  ozempic  currently, insulin  mix BID.   Checking sugars at home, lately 90-120s fasting.    Hx/o afib - on eliquis  BID, no issues, no bleeding, no bruising.    Elevated potassium - on Lokelma since April 2025.    In the recent months, she saw vascular and vein regarding access for dialysis.    CKD 5 - complication with BP medications per nephrology.      Past Medical History:  Diagnosis Date   Atrial fibrillation (HCC) 05/2016   Chronic kidney disease    Diabetes mellitus without complication (HCC)    type II   History of Helicobacter pylori infection    Hyperlipidemia    Hypertension    Microalbuminuria 2016   Obesity    Current Outpatient Medications on File Prior to Visit  Medication Sig Dispense Refill   allopurinol  (ZYLOPRIM ) 100 MG tablet TAKE 2 TABLETS(200 MG) BY MOUTH DAILY 60 tablet 0   amLODipine -olmesartan  (AZOR ) 10-40 MG tablet Take 1 tablet by mouth daily. 90 tablet 3   apixaban  (ELIQUIS ) 5 MG TABS tablet Take 1 tablet (5 mg total) by mouth 2 (two) times daily. 60 tablet 11   atenolol  (TENORMIN ) 50 MG tablet Take 1 tablet (50 mg total) by mouth 2 (two) times daily. 180 tablet 3   blood glucose meter kit and supplies KIT Dispense based on patient and insurance preference. Use up to four times daily as directed.  (FOR ICD-9 250.00, 250.01). 1 each 0   ferrous sulfate 325 (65 FE) MG EC tablet Take 325 mg by mouth 2 (two) times daily.     hydrALAZINE  (APRESOLINE ) 50 MG tablet Take 50 mg by mouth 2 (two) times daily.     Insulin  Lispro Prot & Lispro (HUMALOG  MIX 75/25 KWIKPEN) (75-25) 100 UNIT/ML Kwikpen Inject 10 Units into the skin daily before breakfast AND 14 Units daily before supper. 30 mL 3   Insulin  Pen Needle 32G X 4 MM MISC 1 Device by Does not apply route in the morning and at bedtime. 200 each 3   LOKELMA 10 g PACK packet Take 1 packet by mouth daily.     Multiple Vitamin (MULTIVITAMIN WITH MINERALS) TABS tablet Take 1 tablet by mouth daily.     OZEMPIC , 0.25 OR 0.5 MG/DOSE, 2 MG/3ML SOPN INJECT 0.5 MG UNDER THE SKIN ONCE A WEEK 3 mL 3   Semaglutide ,0.25 or 0.5MG /DOS, 2 MG/3ML SOPN Inject 0.5 mg into the skin once a week. 9 mL 3   vitamin C (ASCORBIC ACID) 250 MG tablet Take 250 mg by mouth daily.     glucose blood test strip Contour Next test strips. Use as instructed 100 each 6   No current facility-administered medications on file prior to visit.   ROS as in subjective    Objective: BP 134/70   Pulse 74   Ht 5'  8 (1.727 m)   Wt 236 lb 6.4 oz (107.2 kg)   SpO2 97%   BMI 35.94 kg/m   BP Readings from Last 3 Encounters:  03/05/24 134/70  02/16/24 (!) 154/80  02/01/24 (!) 157/77   Wt Readings from Last 3 Encounters:  03/05/24 236 lb 6.4 oz (107.2 kg)  02/16/24 238 lb 6.4 oz (108.1 kg)  02/01/24 238 lb 5.1 oz (108.1 kg)    General appearence: alert, no distress, WD/WN,  Neck: supple, no lymphadenopathy, no thyromegaly, no masses, no JVD Heart: RRR, normal S1, S2, no murmurs Lungs: CTA bilaterally, no wheezes, rhonchi, or rales Pulses: 2+ symmetric, upper and lower extremities, normal cap refill Ext: 1+ pitting LE edema, bilat.      Assessment: Encounter Diagnoses  Name Primary?   CKD (chronic kidney disease) stage 5, GFR less than 15 ml/min (HCC) Yes   PAF  (paroxysmal atrial fibrillation) (HCC)    Type 2 diabetes mellitus with stage 5 chronic kidney disease not on chronic dialysis, with long-term current use of insulin  (HCC)    Elevated blood uric acid level    Essential hypertension, benign    Hyperlipidemia associated with type 2 diabetes mellitus (HCC)    Vaccine counseling    Edema, unspecified type       Plan: Reviewed her nephrology notes from April 2025.  She has stage CKD 5.  Related to diabetes and hypertension, progressive CKD, and as of April 2022 she opted for in center hemodialysis.  She declines transplant evaluation home therapy as of that visit.  She was awaiting appointment with vascular and vein for preemptive access evaluation.  As of that visit she was on GLP-1 but off SGLT2's given acute kidney injury and off ARB's in efforts to preserve eGFR  She was also started by nephrology in April 2025 on Lokelma 10 g daily for high potassium  Hypertension-managed by nephrology.  Continued on amlodipine  10 mg daily, atenolol  50 mg twice daily, hydralazine  50 mg twice daily.  Furosemide will be the next agent added on if not at goal per nephrology  Hyperlipidemia-has been on rosuvastatin  Crestor  20 mg daily, however, given current GFR, reduce down to 10mg  daily rosuvastatin .    Diabetes-managed by endocrinology, currently on Ozempic  0.5 mg weekly, Humalog  75/25 mix insulin  twice daily, 10 units a.m., 14 units p.m.  Hgba1c 6.3% as of 12/2023 endocrinology visit, improved from prior.  History of elevated uric acid-continue allopurinol  100 mg, 2 tablets daily  History of A-fib-continue Eliquis  5 mg twice daily  Ophthalmology - we will request last eye exam.    Advised last Tdap 10 years ago, go to pharmacy for updated Tdap.  Edema - continue compression hose, avoid salt or excess salt in the diet.   Advised regular exercise.   Follow up with nephrology as scheculed.   She underwent AV fistula creation 01/2024 in preparation for  dialysis.   Afiya was seen today for medication management.  Diagnoses and all orders for this visit:  CKD (chronic kidney disease) stage 5, GFR less than 15 ml/min (HCC) -     Comprehensive metabolic panel with GFR  PAF (paroxysmal atrial fibrillation) (HCC)  Type 2 diabetes mellitus with stage 5 chronic kidney disease not on chronic dialysis, with long-term current use of insulin  (HCC) -     Comprehensive metabolic panel with GFR  Elevated blood uric acid level -     Uric acid -     Comprehensive metabolic panel with GFR  Essential  hypertension, benign -     Comprehensive metabolic panel with GFR  Hyperlipidemia associated with type 2 diabetes mellitus (HCC) -     Lipid panel -     Comprehensive metabolic panel with GFR  Vaccine counseling  Edema, unspecified type  Other orders -     rosuvastatin  (CRESTOR ) 10 MG tablet; Take 1 tablet (10 mg total) by mouth daily.  Spent > 45 minutes face to face with patient in discussion of symptoms, evaluation, plan and recommendations.    F/u pending labs, yearly for well visit.

## 2024-03-06 ENCOUNTER — Ambulatory Visit: Payer: Self-pay | Admitting: Medical

## 2024-03-06 ENCOUNTER — Telehealth: Payer: Self-pay | Admitting: *Deleted

## 2024-03-06 LAB — LIPID PANEL
Cholesterol, Total: 141 mg/dL (ref 100–199)
HDL: 75 mg/dL (ref >39)
LDL CALC COMMENT:: 1.9 ratio (ref 0.0–4.4)
LDL Chol Calc (NIH): 52 mg/dL (ref 0–99)
Triglycerides: 72 mg/dL (ref 0–149)
VLDL Cholesterol Cal: 14 mg/dL (ref 5–40)

## 2024-03-06 LAB — COMPREHENSIVE METABOLIC PANEL WITH GFR
ALT: 15 IU/L (ref 0–32)
AST: 15 IU/L (ref 0–40)
Albumin: 4.1 g/dL (ref 3.9–4.9)
Alkaline Phosphatase: 125 IU/L — ABNORMAL HIGH (ref 44–121)
BUN/Creatinine Ratio: 10 — ABNORMAL LOW (ref 12–28)
BUN: 35 mg/dL — ABNORMAL HIGH (ref 8–27)
Bilirubin Total: 0.4 mg/dL (ref 0.0–1.2)
CO2: 16 mmol/L — ABNORMAL LOW (ref 20–29)
Calcium: 9.7 mg/dL (ref 8.7–10.3)
Chloride: 102 mmol/L (ref 96–106)
Creatinine, Ser: 3.34 mg/dL — ABNORMAL HIGH (ref 0.57–1.00)
Globulin, Total: 3 g/dL (ref 1.5–4.5)
Glucose: 169 mg/dL — ABNORMAL HIGH (ref 70–99)
Potassium: 3.9 mmol/L (ref 3.5–5.2)
Sodium: 138 mmol/L (ref 134–144)
Total Protein: 7.1 g/dL (ref 6.0–8.5)
eGFR: 15 mL/min/{1.73_m2} — ABNORMAL LOW (ref >59)

## 2024-03-06 LAB — URIC ACID: Uric Acid: 6 mg/dL (ref 3.0–7.2)

## 2024-03-06 NOTE — Progress Notes (Signed)
 Labs are stable.  Make the changes we discussed yesterday lowering your Crestor  down to 10 mg daily.  Get your Tdap tetanus booster at your pharmacy.  Wear compression hose daily.  Exercise regular.  Follow-up with your specialist as scheduled  Continue other medicines as usual  Note: Please send copy of labs and office note to [facility]

## 2024-03-06 NOTE — Telephone Encounter (Signed)
 Copied from CRM [id number]. Topic: Clinical - Lab/Test Results >> Mar 06, 2024 10:29 AM Graeme ORN wrote: Reason for CRM: Patient returned missed call for labs. Read note as written by provider. Patient understood. No Further questions at this time. Thank You.  Noted.

## 2024-03-13 ENCOUNTER — Other Ambulatory Visit: Payer: Self-pay | Admitting: Internal Medicine

## 2024-03-14 ENCOUNTER — Other Ambulatory Visit: Payer: Self-pay | Admitting: *Deleted

## 2024-03-14 ENCOUNTER — Other Ambulatory Visit: Payer: Self-pay | Admitting: Medical

## 2024-03-14 DIAGNOSIS — N186 End stage renal disease: Secondary | ICD-10-CM

## 2024-03-19 ENCOUNTER — Ambulatory Visit (HOSPITAL_COMMUNITY)
Admission: RE | Admit: 2024-03-19 | Discharge: 2024-03-19 | Disposition: A | Discharge status: Home / Self Care | Source: Ambulatory Visit | Attending: Student | Admitting: Student

## 2024-03-19 DIAGNOSIS — I480 Paroxysmal atrial fibrillation: Secondary | ICD-10-CM | POA: Insufficient documentation

## 2024-03-19 DIAGNOSIS — R600 Localized edema: Secondary | ICD-10-CM | POA: Insufficient documentation

## 2024-03-20 ENCOUNTER — Ambulatory Visit: Payer: Self-pay | Admitting: Student

## 2024-03-20 LAB — ECHOCARDIOGRAM COMPLETE
Est EF: 50
S' Lateral: 3.7 cm

## 2024-03-22 ENCOUNTER — Telehealth: Payer: Self-pay

## 2024-03-22 NOTE — Telephone Encounter (Signed)
 Patient stop taking the Humalog  mix 2 days ago because she was having swelling in abdomen and SOB. Patient states her BS have been about 112 fasting while off the Humalog  mix. Patient would like to know if you want to prescribe another insulin . Patient is okay with getting a callback tomorrow when Dr. Sam returns to office.

## 2024-03-23 ENCOUNTER — Ambulatory Visit: Attending: Vascular Surgery | Admitting: Physician Assistant

## 2024-03-23 ENCOUNTER — Ambulatory Visit (HOSPITAL_COMMUNITY)
Admission: RE | Admit: 2024-03-23 | Discharge: 2024-03-23 | Disposition: A | Discharge status: Home / Self Care | Source: Ambulatory Visit | Attending: Vascular Surgery | Admitting: Vascular Surgery

## 2024-03-23 VITALS — BP 136/77 | HR 72 | Temp 97.6°F | Resp 20 | Ht 68.0 in | Wt 230.1 lb

## 2024-03-23 DIAGNOSIS — N186 End stage renal disease: Secondary | ICD-10-CM | POA: Diagnosis not present

## 2024-03-23 DIAGNOSIS — N185 Chronic kidney disease, stage 5: Secondary | ICD-10-CM

## 2024-03-23 NOTE — Progress Notes (Signed)
 POST OPERATIVE OFFICE NOTE    CC:  F/u for surgery  HPI:  This is a 68 y.o. female who is s/p left BC AVF on 02/01/2024 by Dr. Pearline   Pt states she does not have pain in the left hand.    The pt is not yet on dialysis.  She has appt with nephrology next month.  She does not know of a definitive plan to go on HD.    Allergies  Allergen Reactions   Latex Itching   Xarelto  [Rivaroxaban ]     Vaginal Bleeding    Current Outpatient Medications  Medication Sig Dispense Refill   allopurinol  (ZYLOPRIM ) 100 MG tablet TAKE 2 TABLETS(200 MG) BY MOUTH DAILY 60 tablet 5   amLODipine -olmesartan  (AZOR ) 10-40 MG tablet Take 1 tablet by mouth daily. 90 tablet 3   apixaban  (ELIQUIS ) 5 MG TABS tablet Take 1 tablet (5 mg total) by mouth 2 (two) times daily. 60 tablet 11   atenolol  (TENORMIN ) 50 MG tablet Take 1 tablet (50 mg total) by mouth 2 (two) times daily. 180 tablet 3   blood glucose meter kit and supplies KIT Dispense based on patient and insurance preference. Use up to four times daily as directed. (FOR ICD-9 250.00, 250.01). 1 each 0   ferrous sulfate 325 (65 FE) MG EC tablet Take 325 mg by mouth 2 (two) times daily.     glucose blood test strip Contour Next test strips. Use as instructed 100 each 6   hydrALAZINE  (APRESOLINE ) 50 MG tablet Take 50 mg by mouth 2 (two) times daily.     Insulin  Lispro Prot & Lispro (HUMALOG  MIX 75/25 KWIKPEN) (75-25) 100 UNIT/ML Kwikpen Inject 10 Units into the skin daily before breakfast AND 14 Units daily before supper. 30 mL 3   Insulin  Pen Needle 32G X 4 MM MISC 1 Device by Does not apply route in the morning and at bedtime. 200 each 3   LOKELMA 10 g PACK packet Take 1 packet by mouth daily.     Multiple Vitamin (MULTIVITAMIN WITH MINERALS) TABS tablet Take 1 tablet by mouth daily.     OZEMPIC , 0.25 OR 0.5 MG/DOSE, 2 MG/3ML SOPN INJECT 0.5 MG UNDER THE SKIN ONCE WEEKLY 3 mL 3   rosuvastatin  (CRESTOR ) 10 MG tablet Take 1 tablet (10 mg total) by mouth daily.  90 tablet 2   Semaglutide ,0.25 or 0.5MG /DOS, 2 MG/3ML SOPN Inject 0.5 mg into the skin once a week. 9 mL 3   vitamin C (ASCORBIC ACID) 250 MG tablet Take 250 mg by mouth daily.     No current facility-administered medications for this visit.     ROS:  See HPI  Physical Exam:  Today's Vitals   03/23/24 1357  BP: 136/77  Pulse: 72  Resp: 20  Temp: 97.6 F (36.4 C)  TempSrc: Temporal  SpO2: 99%  Weight: 230 lb 1.6 oz (104.4 kg)  Height: 5' 8 (1.727 m)   Body mass index is 34.99 kg/m.   Incision:  well healed Extremities:   There is a palpable left radial pulse.   Motor and sensory are in tact.   There is a thrill/bruit present.  Access is  easily palpable distally but gets more difficult to feel proximally.    Dialysis Duplex on 03/23/2024: Findings:  +--------------------+----------+-----------------+--------+  AVF                PSV (cm/s)Flow Vol (mL/min)Comments  +--------------------+----------+-----------------+--------+  Native artery inflow   246  2241                 +--------------------+----------+-----------------+--------+  AVF Anastomosis        567                               +--------------------+----------+-----------------+--------+     +------------+----------+-------------+----------+--------+  OUTFLOW VEINPSV (cm/s)Diameter (cm)Depth (cm)Describe  +------------+----------+-------------+----------+--------+  Shoulder      108        0.70        1.39             +------------+----------+-------------+----------+--------+  Prox UA        108        0.68        1.18             +------------+----------+-------------+----------+--------+  Mid UA          89        0.80        0.83             +------------+----------+-------------+----------+--------+  Dist UA        163        0.88        0.83             +------------+----------+-------------+----------+--------+   Summary:  Patent  arteriovenous fistula.    Assessment/Plan:  This is a 69 y.o. female who is s/p:  left BC AVF on 02/01/2024 by Dr. Pearline   -the pt does not have evidence of steal. -pt's fistula has matured nicely, however it is too deep to be used for access.  Discussed with pt and her husband that the fistula would need to be superficialized. Discussed that if we do it in the next 2-3 weeks it would have time to heal and be ready to use should she need to go on HD.  Discussed if it is too deep to use, she would require a TDC if we did not superficialize it.   -if pt has tunneled catheter, this can be removed at the discretion of the dialysis center once the pt's access has been successfully cannulated to their satisfaction.  -discussed with pt that access does not last forever and will need intervention or even new access at some point.  -we will plan for left arm superficialization of her fistula in the next 2-3 weeks with Dr. Pearline when he has time and is good with the pt.  She is on Eliquis .    Lucie Apt, [facility] [phone]  Clinic MD:  Serene on call MD

## 2024-03-23 NOTE — H&P (View-Only) (Signed)
 POST OPERATIVE OFFICE NOTE    CC:  F/u for surgery  HPI:  This is a 68 y.o. female who is s/p left BC AVF on 02/01/2024 by Dr. Pearline   Pt states she does not have pain in the left hand.    The pt is not yet on dialysis.  She has appt with nephrology next month.  She does not know of a definitive plan to go on HD.    Allergies  Allergen Reactions   Latex Itching   Xarelto  [Rivaroxaban ]     Vaginal Bleeding    Current Outpatient Medications  Medication Sig Dispense Refill   allopurinol  (ZYLOPRIM ) 100 MG tablet TAKE 2 TABLETS(200 MG) BY MOUTH DAILY 60 tablet 5   amLODipine -olmesartan  (AZOR ) 10-40 MG tablet Take 1 tablet by mouth daily. 90 tablet 3   apixaban  (ELIQUIS ) 5 MG TABS tablet Take 1 tablet (5 mg total) by mouth 2 (two) times daily. 60 tablet 11   atenolol  (TENORMIN ) 50 MG tablet Take 1 tablet (50 mg total) by mouth 2 (two) times daily. 180 tablet 3   blood glucose meter kit and supplies KIT Dispense based on patient and insurance preference. Use up to four times daily as directed. (FOR ICD-9 250.00, 250.01). 1 each 0   ferrous sulfate 325 (65 FE) MG EC tablet Take 325 mg by mouth 2 (two) times daily.     glucose blood test strip Contour Next test strips. Use as instructed 100 each 6   hydrALAZINE  (APRESOLINE ) 50 MG tablet Take 50 mg by mouth 2 (two) times daily.     Insulin  Lispro Prot & Lispro (HUMALOG  MIX 75/25 KWIKPEN) (75-25) 100 UNIT/ML Kwikpen Inject 10 Units into the skin daily before breakfast AND 14 Units daily before supper. 30 mL 3   Insulin  Pen Needle 32G X 4 MM MISC 1 Device by Does not apply route in the morning and at bedtime. 200 each 3   LOKELMA 10 g PACK packet Take 1 packet by mouth daily.     Multiple Vitamin (MULTIVITAMIN WITH MINERALS) TABS tablet Take 1 tablet by mouth daily.     OZEMPIC , 0.25 OR 0.5 MG/DOSE, 2 MG/3ML SOPN INJECT 0.5 MG UNDER THE SKIN ONCE WEEKLY 3 mL 3   rosuvastatin  (CRESTOR ) 10 MG tablet Take 1 tablet (10 mg total) by mouth daily.  90 tablet 2   Semaglutide ,0.25 or 0.5MG /DOS, 2 MG/3ML SOPN Inject 0.5 mg into the skin once a week. 9 mL 3   vitamin C (ASCORBIC ACID) 250 MG tablet Take 250 mg by mouth daily.     No current facility-administered medications for this visit.     ROS:  See HPI  Physical Exam:  Today's Vitals   03/23/24 1357  BP: 136/77  Pulse: 72  Resp: 20  Temp: 97.6 F (36.4 C)  TempSrc: Temporal  SpO2: 99%  Weight: 230 lb 1.6 oz (104.4 kg)  Height: 5' 8 (1.727 m)   Body mass index is 34.99 kg/m.   Incision:  well healed Extremities:   There is a palpable left radial pulse.   Motor and sensory are in tact.   There is a thrill/bruit present.  Access is  easily palpable distally but gets more difficult to feel proximally.    Dialysis Duplex on 03/23/2024: Findings:  +--------------------+----------+-----------------+--------+  AVF                PSV (cm/s)Flow Vol (mL/min)Comments  +--------------------+----------+-----------------+--------+  Native artery inflow   246  2241                 +--------------------+----------+-----------------+--------+  AVF Anastomosis        567                               +--------------------+----------+-----------------+--------+     +------------+----------+-------------+----------+--------+  OUTFLOW VEINPSV (cm/s)Diameter (cm)Depth (cm)Describe  +------------+----------+-------------+----------+--------+  Shoulder      108        0.70        1.39             +------------+----------+-------------+----------+--------+  Prox UA        108        0.68        1.18             +------------+----------+-------------+----------+--------+  Mid UA          89        0.80        0.83             +------------+----------+-------------+----------+--------+  Dist UA        163        0.88        0.83             +------------+----------+-------------+----------+--------+   Summary:  Patent  arteriovenous fistula.    Assessment/Plan:  This is a 69 y.o. female who is s/p:  left BC AVF on 02/01/2024 by Dr. Pearline   -the pt does not have evidence of steal. -pt's fistula has matured nicely, however it is too deep to be used for access.  Discussed with pt and her husband that the fistula would need to be superficialized. Discussed that if we do it in the next 2-3 weeks it would have time to heal and be ready to use should she need to go on HD.  Discussed if it is too deep to use, she would require a TDC if we did not superficialize it.   -if pt has tunneled catheter, this can be removed at the discretion of the dialysis center once the pt's access has been successfully cannulated to their satisfaction.  -discussed with pt that access does not last forever and will need intervention or even new access at some point.  -we will plan for left arm superficialization of her fistula in the next 2-3 weeks with Dr. Pearline when he has time and is good with the pt.  She is on Eliquis .    Lucie Apt, [facility] [phone]  Clinic MD:  Serene on call MD

## 2024-03-23 NOTE — Telephone Encounter (Signed)
 Patient advised and verbalized understanding

## 2024-03-26 ENCOUNTER — Other Ambulatory Visit: Payer: Self-pay

## 2024-03-26 ENCOUNTER — Telehealth: Payer: Self-pay

## 2024-03-26 DIAGNOSIS — N186 End stage renal disease: Secondary | ICD-10-CM

## 2024-03-26 NOTE — Telephone Encounter (Signed)
 Attempted to call for surgery scheduling. Kathy [name]

## 2024-04-13 ENCOUNTER — Other Ambulatory Visit: Payer: Self-pay | Admitting: Medical

## 2024-04-15 ENCOUNTER — Other Ambulatory Visit: Payer: Self-pay | Admitting: Student

## 2024-04-16 ENCOUNTER — Other Ambulatory Visit: Payer: Self-pay

## 2024-04-16 ENCOUNTER — Encounter (HOSPITAL_COMMUNITY): Payer: Self-pay | Admitting: Vascular Surgery

## 2024-04-16 NOTE — Anesthesia Preprocedure Evaluation (Signed)
 Anesthesia Evaluation  Patient identified by MRN, date of birth, ID band Patient awake    Reviewed: Allergy & Precautions, H&P , NPO status , Patient's Chart, lab work & pertinent test results, reviewed documented beta blocker date and time   Airway Mallampati: III  TM Distance: >3 FB Neck ROM: Full    Dental  (+) Edentulous Upper, Edentulous Lower   Pulmonary sleep apnea (still has not had sleep study, pt denies needing it)    Pulmonary exam normal breath sounds clear to auscultation       Cardiovascular hypertension, Pt. on medications and Pt. on home beta blockers pulmonary hypertension (mod pHTN on echo)+ dysrhythmias (eliquis ) Atrial Fibrillation  Rhythm:Regular Rate:Normal  Echo 03/19/24  1. Left ventricular ejection fraction, by estimation, is 50%. Left  ventricular ejection fraction by 3D volume is 51 %. The left ventricle has  mildly decreased function. The left ventricle demonstrates global  hypokinesis. Left ventricular diastolic  parameters are indeterminate. The average left ventricular global  longitudinal strain is -13.7 %. The global longitudinal strain is  abnormal.   2. Right ventricular systolic function is normal. The right ventricular  size is normal. There is moderately elevated pulmonary artery systolic  pressure. The estimated right ventricular systolic pressure is 51.3 mmHg.   3. Left atrial size was mildly dilated.   4. The mitral valve is normal in structure. Trivial mitral valve  regurgitation. No evidence of mitral stenosis.   5. The aortic valve is tricuspid. Aortic valve regurgitation is not  visualized. No aortic stenosis is present.   6. The inferior vena cava is normal in size with <50% respiratory  variability, suggesting right atrial pressure of 8 mmHg.   7. The patient was in atrial fibrillation.      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  negative genitourinary   Musculoskeletal negative musculoskeletal ROS (+)    Abdominal  (+) + obese  Peds negative pediatric ROS (+)  Hematology  (+) Blood dyscrasia, anemia Hb 11.2   Anesthesia Other Findings Ozempic  LD: 8d  Reproductive/Obstetrics negative OB ROS                              Anesthesia Physical Anesthesia Plan  ASA: 3  Anesthesia Plan: MAC and Regional   Post-op Pain Management: Tylenol  PO (pre-op )*   Induction:   PONV Risk Score and Plan: Ondansetron , Dexamethasone , Midazolam  and Treatment may vary due to age or medical condition  Airway Management Planned: Natural Airway and Simple Face Mask  Additional Equipment: None  Intra-op Plan:   Post-operative Plan:   Informed Consent: I have reviewed the patients History and Physical, chart, labs and discussed the procedure including the risks, benefits and alternatives for the proposed anesthesia with the patient or authorized representative who has indicated his/her understanding and acceptance.       Plan Discussed with: CRNA  Anesthesia Plan Comments: (  )         Anesthesia Quick Evaluation

## 2024-04-16 NOTE — Progress Notes (Signed)
 Anesthesia Chart Review: Same day workup  68 year old female follows with cardiology for history of paroxysmal atrial fibrillation on Eliquis , HTN, HLD.  Event monitor 2021 showed predominant underlying rhythm to be sinus, rare PACs and PVCs, no atrial fibrillation burden.  Echo 03/2024 showed LVEF 50%, RV normal, moderately elevated pulmonary pressure with RVSP 51.3 mmHg, no significant valvular abnormalities.  Given elevated right heart pressures, she was referred for sleep study to rule out sleep apnea.  She has not yet had this.     Follows with endocrinology for history of IDDM 2 and left adrenal adenoma (stable, no clinical or biochemical evidence of extra, no excretion).  A1c 6.3 on 12/26/2023.   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  Creatinine 3.34 on BMP 03/05/2024. She is s/p left BC AVF on 02/01/24.   Patient reports last dose of Eliquis  04/13/24.  Last dose GLP-1 agonist 04/09/24.   She will need day of surgery labs and evaluation.   EKG 02/01/2024: Atrial fibrillation.  Rate 79.  Nonspecific ST and T wave abnormality.   TTE 03/19/2024: 1. Left ventricular ejection fraction, by estimation, is 50%. Left  ventricular ejection fraction by 3D volume is 51 %. The left ventricle has  mildly decreased function. The left ventricle demonstrates global  hypokinesis. Left ventricular diastolic  parameters are indeterminate. The average left ventricular global  longitudinal strain is -13.7 %. The global longitudinal strain is  abnormal.   2. Right ventricular systolic function is normal. The right ventricular  size is normal. There is moderately elevated pulmonary artery systolic  pressure. The estimated right ventricular systolic pressure is 51.3 mmHg.   3. Left atrial size was mildly dilated.   4. The mitral valve is normal in structure. Trivial mitral valve  regurgitation. No evidence of mitral stenosis.   5. The aortic valve is tricuspid. Aortic valve regurgitation is not  visualized. No aortic stenosis  is present.   6. The inferior vena cava is normal in size with <50% respiratory  variability, suggesting right atrial pressure of 8 mmHg.   7. The patient was in atrial fibrillation.   Event monitor 06/2020: Patient had a min HR of 50 bpm, max HR of 92 bpm, and avg HR of 67 bpm. Predominant underlying rhythm was sinus rhythm. 1st degree HB is present. Isolated PACs were rare (<1.0%) with rare couplets. Isolated PVCs were rare (<1.0%). No atrial fibrillation burden.   No malignant arrhythmias.      Lynwood Geofm [facility]IG[facility]/Anesthesiology Phone [phone] 04/16/2024 3:58 PM

## 2024-04-16 NOTE — Progress Notes (Addendum)
 SDW CALL  Patient was given pre-op  instructions over the phone. Patient verbalized understanding of instructions provided.   PCP - Tysinger, Alm RAMAN, PA-C  Cardiologist - Johnson Grate PA-C  Chest x-ray -  EKG - 02/01/2024 Stress Test -  ECHO - 05/31/2016 TTE, ECHO 03/19/24 Cardiac Cath - denies  Sleep Study - denies CPAP -   Fasting Blood Sugar - 100's Checks Blood Sugar 1-2 times a day LD Ozempic  7/28  Blood Thinner Instructions: Eliquis  LD dose 8/1 Aspirin  Instructions:  ERAS Protcol - NPO PRE-SURGERY Ensure or G2-   COVID TEST- n/a   Anesthesia review: heart hx  Patient denies shortness of breath, fever, cough and chest pain over the phone call

## 2024-04-16 NOTE — Telephone Encounter (Signed)
 Prescription refill request for Eliquis  received. Indication: PAF Last office visit: 02/16/24  B Strader PA-C Scr: 3.34 on 03/05/24  Epic Age: 68 Weight: 108.1kg  Based on above findings Eliquis  5mg  twice daily Is the appropriate dose.  Refill approved.

## 2024-04-17 ENCOUNTER — Other Ambulatory Visit: Payer: Self-pay

## 2024-04-17 ENCOUNTER — Other Ambulatory Visit (HOSPITAL_COMMUNITY): Payer: Self-pay

## 2024-04-17 ENCOUNTER — Ambulatory Visit (HOSPITAL_COMMUNITY)
Admission: RE | Admit: 2024-04-17 | Discharge: 2024-04-17 | Disposition: A | Discharge status: Home / Self Care | Attending: Vascular Surgery | Admitting: Vascular Surgery

## 2024-04-17 ENCOUNTER — Encounter (HOSPITAL_COMMUNITY): Payer: Self-pay | Admitting: Vascular Surgery

## 2024-04-17 ENCOUNTER — Encounter (HOSPITAL_COMMUNITY)
Admission: RE | Disposition: A | Discharge status: Home / Self Care | Payer: Self-pay | Source: Home / Self Care | Attending: Vascular Surgery

## 2024-04-17 ENCOUNTER — Ambulatory Visit (HOSPITAL_BASED_OUTPATIENT_CLINIC_OR_DEPARTMENT_OTHER): Admitting: Physician Assistant

## 2024-04-17 ENCOUNTER — Ambulatory Visit (HOSPITAL_COMMUNITY): Admitting: Physician Assistant

## 2024-04-17 DIAGNOSIS — D3502 Benign neoplasm of left adrenal gland: Secondary | ICD-10-CM | POA: Diagnosis not present

## 2024-04-17 DIAGNOSIS — N186 End stage renal disease: Secondary | ICD-10-CM | POA: Diagnosis not present

## 2024-04-17 DIAGNOSIS — E785 Hyperlipidemia, unspecified: Secondary | ICD-10-CM | POA: Diagnosis not present

## 2024-04-17 DIAGNOSIS — Z7901 Long term (current) use of anticoagulants: Secondary | ICD-10-CM | POA: Diagnosis not present

## 2024-04-17 DIAGNOSIS — I480 Paroxysmal atrial fibrillation: Secondary | ICD-10-CM | POA: Diagnosis not present

## 2024-04-17 DIAGNOSIS — I4891 Unspecified atrial fibrillation: Secondary | ICD-10-CM

## 2024-04-17 DIAGNOSIS — T82590A Other mechanical complication of surgically created arteriovenous fistula, initial encounter: Secondary | ICD-10-CM

## 2024-04-17 DIAGNOSIS — N185 Chronic kidney disease, stage 5: Secondary | ICD-10-CM | POA: Insufficient documentation

## 2024-04-17 DIAGNOSIS — I120 Hypertensive chronic kidney disease with stage 5 chronic kidney disease or end stage renal disease: Secondary | ICD-10-CM | POA: Diagnosis not present

## 2024-04-17 DIAGNOSIS — E1022 Type 1 diabetes mellitus with diabetic chronic kidney disease: Secondary | ICD-10-CM | POA: Insufficient documentation

## 2024-04-17 DIAGNOSIS — Z992 Dependence on renal dialysis: Secondary | ICD-10-CM

## 2024-04-17 HISTORY — PX: FISTULA SUPERFICIALIZATION: SHX6341

## 2024-04-17 LAB — POCT I-STAT, CHEM 8
BUN: 35 mg/dL — ABNORMAL HIGH (ref 8–23)
Calcium, Ion: 1.23 mmol/L (ref 1.15–1.40)
Chloride: 108 mmol/L (ref 98–111)
Creatinine, Ser: 3.8 mg/dL — ABNORMAL HIGH (ref 0.44–1.00)
Glucose, Bld: 133 mg/dL — ABNORMAL HIGH (ref 70–99)
HCT: 33 % — ABNORMAL LOW (ref 36.0–46.0)
Hemoglobin: 11.2 g/dL — ABNORMAL LOW (ref 12.0–15.0)
Potassium: 4.4 mmol/L (ref 3.5–5.1)
Sodium: 136 mmol/L (ref 135–145)
TCO2: 19 mmol/L — ABNORMAL LOW (ref 22–32)

## 2024-04-17 LAB — GLUCOSE, CAPILLARY
Glucose-Capillary: 145 mg/dL — ABNORMAL HIGH (ref 70–99)
Glucose-Capillary: 143 mg/dL — ABNORMAL HIGH (ref 70–99)

## 2024-04-17 SURGERY — FISTULA SUPERFICIALIZATION
Anesthesia: Monitor Anesthesia Care | Site: Arm Upper | Laterality: Left

## 2024-04-17 MED ORDER — LIDOCAINE HCL (CARDIAC) PF 100 MG/5ML IV SOSY
PREFILLED_SYRINGE | INTRAVENOUS | Status: DC | PRN
Start: 2024-04-17 — End: 2024-04-17
  Administered 2024-04-17: 40 mg via INTRAVENOUS

## 2024-04-17 MED ORDER — HEPARIN 6000 UNIT IRRIGATION SOLUTION
Status: AC
  Filled 2024-04-17: qty 500

## 2024-04-17 MED ORDER — INSULIN ASPART 100 UNIT/ML IJ SOLN: 0.0000 [IU] | INTRAMUSCULAR | Status: DC | PRN

## 2024-04-17 MED ORDER — CHLORHEXIDINE GLUCONATE 4 % EX SOLN: 60.0000 mL | Freq: Once | CUTANEOUS | Status: DC

## 2024-04-17 MED ORDER — PROPOFOL 10 MG/ML IV BOLUS
INTRAVENOUS | Status: DC | PRN
  Administered 2024-04-17: 30 mg via INTRAVENOUS

## 2024-04-17 MED ORDER — PHENYLEPHRINE HCL-NACL 20-0.9 MG/250ML-% IV SOLN
INTRAVENOUS | Status: DC | PRN
  Administered 2024-04-17: 30 ug/min via INTRAVENOUS

## 2024-04-17 MED ORDER — FENTANYL CITRATE (PF) 250 MCG/5ML IJ SOLN
INTRAMUSCULAR | Status: AC
  Filled 2024-04-17: qty 5

## 2024-04-17 MED ORDER — LIDOCAINE 2% (20 MG/ML) 5 ML SYRINGE
INTRAMUSCULAR | Status: AC
  Filled 2024-04-17: qty 5

## 2024-04-17 MED ORDER — ONDANSETRON HCL 4 MG/2ML IJ SOLN
INTRAMUSCULAR | Status: DC | PRN
  Administered 2024-04-17: 4 mg via INTRAVENOUS

## 2024-04-17 MED ORDER — SODIUM CHLORIDE 0.9 % IV SOLN: INTRAVENOUS | Status: DC

## 2024-04-17 MED ORDER — PROPOFOL 500 MG/50ML IV EMUL
INTRAVENOUS | Status: DC | PRN
  Administered 2024-04-17: 100 ug/kg/min via INTRAVENOUS

## 2024-04-17 MED ORDER — PROPOFOL 10 MG/ML IV BOLUS
INTRAVENOUS | Status: AC
  Filled 2024-04-17: qty 20

## 2024-04-17 MED ORDER — CEFAZOLIN SODIUM-DEXTROSE 2-4 GM/100ML-% IV SOLN
2.0000 g | INTRAVENOUS | Status: AC
  Administered 2024-04-17: 2 g via INTRAVENOUS
  Filled 2024-04-17: qty 100

## 2024-04-17 MED ORDER — ORAL CARE MOUTH RINSE: 15.0000 mL | Freq: Once | OROMUCOSAL | Status: AC

## 2024-04-17 MED ORDER — OXYCODONE HCL 5 MG PO TABS: 5.0000 mg | ORAL_TABLET | Freq: Once | ORAL | Status: DC | PRN

## 2024-04-17 MED ORDER — 0.9 % SODIUM CHLORIDE (POUR BTL) OPTIME
TOPICAL | Status: DC | PRN
  Administered 2024-04-17: 1000 mL

## 2024-04-17 MED ORDER — ROCURONIUM BROMIDE 10 MG/ML (PF) SYRINGE
PREFILLED_SYRINGE | INTRAVENOUS | Status: AC
Start: 2024-04-17 — End: 2024-04-17
  Filled 2024-04-17: qty 10

## 2024-04-17 MED ORDER — MIDAZOLAM HCL 2 MG/2ML IJ SOLN
INTRAMUSCULAR | Status: DC | PRN
  Administered 2024-04-17: 1 mg via INTRAVENOUS

## 2024-04-17 MED ORDER — LIDOCAINE-EPINEPHRINE (PF) 1.5 %-1:200000 IJ SOLN
INTRAMUSCULAR | Status: DC | PRN
  Administered 2024-04-17: 20 mL via PERINEURAL

## 2024-04-17 MED ORDER — DEXAMETHASONE SODIUM PHOSPHATE 10 MG/ML IJ SOLN
INTRAMUSCULAR | Status: AC
  Filled 2024-04-17: qty 1

## 2024-04-17 MED ORDER — ACETAMINOPHEN 500 MG PO TABS
1000.0000 mg | ORAL_TABLET | Freq: Once | ORAL | Status: AC
  Administered 2024-04-17: 1000 mg via ORAL
  Filled 2024-04-17: qty 2

## 2024-04-17 MED ORDER — SODIUM CHLORIDE 0.9% FLUSH: 3.0000 mL | INTRAVENOUS | Status: DC | PRN

## 2024-04-17 MED ORDER — LACTATED RINGERS IV SOLN: INTRAVENOUS | Status: DC | PRN

## 2024-04-17 MED ORDER — ONDANSETRON HCL 4 MG/2ML IJ SOLN: 4.0000 mg | Freq: Once | INTRAMUSCULAR | Status: DC | PRN

## 2024-04-17 MED ORDER — LIDOCAINE HCL (PF) 1 % IJ SOLN
INTRAMUSCULAR | Status: AC
Start: 2024-04-17 — End: 2024-04-17
  Filled 2024-04-17: qty 30

## 2024-04-17 MED ORDER — CHLORHEXIDINE GLUCONATE 0.12 % MT SOLN
15.0000 mL | Freq: Once | OROMUCOSAL | Status: AC
  Administered 2024-04-17: 15 mL via OROMUCOSAL
  Filled 2024-04-17: qty 15

## 2024-04-17 MED ORDER — MIDAZOLAM HCL 2 MG/2ML IJ SOLN
INTRAMUSCULAR | Status: AC
  Filled 2024-04-17: qty 2

## 2024-04-17 MED ORDER — ONDANSETRON HCL 4 MG/2ML IJ SOLN
INTRAMUSCULAR | Status: AC
  Filled 2024-04-17: qty 2

## 2024-04-17 MED ORDER — FENTANYL CITRATE (PF) 100 MCG/2ML IJ SOLN: 25.0000 ug | INTRAMUSCULAR | Status: DC | PRN

## 2024-04-17 MED ORDER — OXYCODONE HCL 5 MG/5ML PO SOLN: 5.0000 mg | Freq: Once | ORAL | Status: DC | PRN

## 2024-04-17 MED ORDER — SUCCINYLCHOLINE CHLORIDE 200 MG/10ML IV SOSY
PREFILLED_SYRINGE | INTRAVENOUS | Status: AC
  Filled 2024-04-17: qty 10

## 2024-04-17 MED ORDER — FENTANYL CITRATE (PF) 250 MCG/5ML IJ SOLN
INTRAMUSCULAR | Status: DC | PRN
  Administered 2024-04-17: 50 ug via INTRAVENOUS

## 2024-04-17 MED ORDER — HEPARIN 6000 UNIT IRRIGATION SOLUTION
Status: DC | PRN
  Administered 2024-04-17: 1

## 2024-04-17 MED ORDER — OXYCODONE-ACETAMINOPHEN 5-325 MG PO TABS
1.0000 | ORAL_TABLET | Freq: Four times a day (QID) | ORAL | 0 refills | Status: DC | PRN
  Filled 2024-04-17: qty 20, 5d supply, fill #0

## 2024-04-17 SURGICAL SUPPLY — 24 items
ARMBAND PINK RESTRICT EXTREMIT (MISCELLANEOUS) ×1 IMPLANT
BAG COUNTER SPONGE SURGICOUNT (BAG) ×1 IMPLANT
CANISTER SUCTION 3000ML PPV (SUCTIONS) ×1 IMPLANT
CLIP TI MEDIUM 6 (CLIP) ×1 IMPLANT
CLIP TI WIDE RED SMALL 6 (CLIP) ×1 IMPLANT
COVER PROBE W GEL 5X96 (DRAPES) ×1 IMPLANT
DERMABOND ADVANCED .7 DNX12 (GAUZE/BANDAGES/DRESSINGS) ×1 IMPLANT
ELECTRODE REM PT RTRN 9FT ADLT (ELECTROSURGICAL) ×1 IMPLANT
GLOVE BIOGEL PI IND STRL 7.0 (GLOVE) ×1 IMPLANT
GOWN STRL REUS W/ TWL LRG LVL3 (GOWN DISPOSABLE) ×2 IMPLANT
GOWN STRL REUS W/ TWL XL LVL3 (GOWN DISPOSABLE) ×1 IMPLANT
KIT BASIN OR (CUSTOM PROCEDURE TRAY) ×1 IMPLANT
KIT TURNOVER KIT B (KITS) ×1 IMPLANT
NS IRRIG 1000ML POUR BTL (IV SOLUTION) ×1 IMPLANT
PACK CV ACCESS (CUSTOM PROCEDURE TRAY) ×1 IMPLANT
PAD ARMBOARD POSITIONER FOAM (MISCELLANEOUS) ×2 IMPLANT
POWDER SURGICEL 3.0 GRAM (HEMOSTASIS) IMPLANT
SLING ARM FOAM STRAP LRG (SOFTGOODS) IMPLANT
SUT MNCRL AB 4-0 PS2 18 (SUTURE) ×1 IMPLANT
SUT PROLENE 6 0 BV (SUTURE) ×1 IMPLANT
SUT VIC AB 3-0 SH 27X BRD (SUTURE) ×1 IMPLANT
TOWEL GREEN STERILE (TOWEL DISPOSABLE) ×1 IMPLANT
UNDERPAD 30X36 HEAVY ABSORB (UNDERPADS AND DIAPERS) ×1 IMPLANT
WATER STERILE IRR 1000ML POUR (IV SOLUTION) ×1 IMPLANT

## 2024-04-17 NOTE — Discharge Instructions (Signed)
   [facility]  Discharge Instructions  AV Fistula or Graft Surgery for Dialysis Access  Please refer to the following instructions for your post-procedure care. Your surgeon or physician assistant will discuss any changes with you.  Activity  You may drive the day following your surgery, if you are comfortable and no longer taking prescription pain medication. Resume full activity as the soreness in your incision resolves.  Bathing/Showering  You may shower after you go home. Keep your incision dry for 48 hours. Do not soak in a bathtub, hot tub, or swim until the incision heals completely. You may not shower if you have a hemodialysis catheter.  Incision Care  Clean your incision with mild soap and water after 48 hours. Pat the area dry with a clean towel. You do not need a bandage unless otherwise instructed. Do not apply any ointments or creams to your incision. You may have skin glue on your incision. Do not peel it off. It will come off on its own in about one week. Your arm may swell a bit after surgery. To reduce swelling use pillows to elevate your arm so it is above your heart. Your doctor will tell you if you need to lightly wrap your arm with an ACE bandage.  Diet  Resume your normal diet. There are not special food restrictions following this procedure. In order to heal from your surgery, it is CRITICAL to get adequate nutrition. Your body requires vitamins, minerals, and protein. Vegetables are the best source of vitamins and minerals. Vegetables also provide the perfect balance of protein. Processed food has little nutritional value, so try to avoid this.  Medications  Resume taking all of your medications. If your incision is causing pain, you may take over-the counter pain relievers such as acetaminophen  (Tylenol ). If you were prescribed a stronger pain medication, please be aware these medications can cause nausea and constipation. Prevent  nausea by taking the medication with a snack or meal. Avoid constipation by drinking plenty of fluids and eating foods with high amount of fiber, such as fruits, vegetables, and grains.  Do not take Tylenol  if you are taking prescription pain medications.  Follow up Your surgeon may want to see you in the office following your access surgery. If so, this will be arranged at the time of your surgery.  Please call us  immediately for any of the following conditions:  Increased pain, redness, drainage (pus) from your incision site Fever of 101 degrees or higher Severe or worsening pain at your incision site Hand pain or numbness.  Reduce your risk of vascular disease:  Stop smoking. If you would like help, call [facility] at 1-800-QUIT-NOW (613-508-5963) or [place] at 9703350540  Manage your cholesterol Maintain a desired weight Control your diabetes Keep your blood pressure down  Dialysis  It will take several weeks to several months for your new dialysis access to be ready for use. Your surgeon will determine when it is okay to use it. Your nephrologist will continue to direct your dialysis. You can continue to use your Permcath until your new access is ready for use.   04/17/2024 Kathy [name] [id number] 12-06-1955  Surgeon(s): Pearline Norman RAMAN, MD  Procedure(s): LEFT ARM ARTERIOVENOUS FISTULA SUPERFICIALIZATION  x Do not stick fistula for 4 weeks    If you have any questions, please call the office at [phone].

## 2024-04-17 NOTE — Op Note (Signed)
    OPERATIVE NOTE  PROCEDURE:   Left brachiocephalic AVF superficialization   PRE-OPERATIVE DIAGNOSIS: CKD V  POST-OPERATIVE DIAGNOSIS: same as above   SURGEON: Norman GORMAN Serve MD  ASSISTANT(S): Lucie Apt, PA  Given the complexity of the case,  the assistant was necessary in order to expedient the procedure and safely perform the technical aspects of the operation.  The assistant provided traction and countertraction to assist with exposure of the AVF.  They assisted with suture ligature of branches.  Their assistance was critical in the performance of the superficialization.These skills, especially following the Prolene suture for the anastomosis, could not have been adequately performed by a scrub tech assistant.  ANESTHESIA: regional  ESTIMATED BLOOD LOSS: minimal  FINDING(S): Appropriately dilated left arm AVF about 1cm deep throughout its course. Superficialized with a palpable thrill on completion   SPECIMEN(S):  none  INDICATIONS:   Kathy [name] is a 68 y.o. female with CKD 5 who recently underwent left arm brachiocephalic fistula creation on 02/01/2024.  She was seen in the office for duplex follow-up and imaging noted adequate dilation of the fistula with adequate flows although the depth was borderline for cannulation.  Risks and benefits of superficialization were reviewed and she elected to proceed.  DESCRIPTION: The patient was brought to the operating room and positioned supine on the operating table.  She underwent a regional block in preop and therefore sedation was administered.  The left arm was prepped and draped in usual sterile fashion.  A timeout was performed and preoperative antibiotics were administered. We started by mapping the left arm AV fistula throughout its entire course of the anastomosis into the shoulder.  2 skip incisions were made overlying the anterior aspect of the AV fistula.  This was carried deeper with electrocautery.  The  entirety of the AV fistula was then dissected free circumferentially.  Branches were ligated and transected between silk ties.  The fistula was noted to be dilated appropriately.  Subdermal flaps were then created in the skip incisions and both wounds were copiously irrigated.  The deeper fatty tissue was closed underneath the AV fistula effectively superficial lysing the vein.  The skin was then closed over top with 4-0 Monocryl.  On completion there she was noted to have palpable thrill. All counts were correct at the end of the procedure, she tolerated procedure well and was brought to recovery in stable condition.    COMPLICATIONS: None apparent  CONDITION: Stable  Norman GORMAN Serve MD [facility] Phone Number: [phone] 04/17/2024 9:47 AM

## 2024-04-17 NOTE — Anesthesia Procedure Notes (Addendum)
 Anesthesia Regional Block: Supraclavicular block   Pre-Anesthetic Checklist: , timeout performed,  Correct Patient, Correct Site, Correct Laterality,  Correct Procedure, Correct Position, site marked,  Risks and benefits discussed,  Surgical consent,  Pre-op  evaluation,  At surgeon's request and post-op pain management  Laterality: Left  Prep: Maximum Sterile Barrier Precautions used, chloraprep       Needles:  Injection technique: Single-shot  Needle Type: Echogenic Stimulator Needle     Needle Length: 9cm  Needle Gauge: 22     Additional Needles:   Procedures:,,,, ultrasound used (permanent image in chart),,    Narrative:  Start time: 04/17/2024 7:15 AM End time: 04/17/2024 7:20 AM Injection made incrementally with aspirations every 5 mL.  Performed by: Personally  Anesthesiologist: Merla Almarie HERO, DO  Additional Notes: Monitors applied. No increased pain on injection. No increased resistance to injection. Injection made in 5cc increments. Good needle visualization. Patient tolerated procedure well.  Additional intercostobrachial nerve field block.

## 2024-04-17 NOTE — Anesthesia Postprocedure Evaluation (Signed)
 Anesthesia Post Note  Patient: Kathy [name]  Procedure(s) Performed: LEFT ARM ARTERIOVENOUS FISTULA SUPERFICIALIZATION (Left: Arm Upper)     Patient location during evaluation: PACU Anesthesia Type: Regional and MAC Level of consciousness: awake and alert Pain management: pain level controlled Vital Signs Assessment: post-procedure vital signs reviewed and stable Respiratory status: spontaneous breathing, nonlabored ventilation and respiratory function stable Cardiovascular status: blood pressure returned to baseline and stable Postop Assessment: no apparent nausea or vomiting Anesthetic complications: no   No notable events documented.  Last Vitals:  Vitals:   04/17/24 0915 04/17/24 0930  BP: 130/65 131/67  Pulse: 68 67  Resp: 20 18  Temp:  36.6 C  SpO2: 93% 93%    Last Pain:  Vitals:   04/17/24 0930  TempSrc:   PainSc: 0-No pain                 [name]

## 2024-04-17 NOTE — Interval H&P Note (Signed)
 History and Physical Interval Note:  04/17/2024 7:20 AM  Kathy [name]  has presented today for surgery, with the diagnosis of ESRD.  The various methods of treatment have been discussed with the patient and family. After consideration of risks, benefits and other options for treatment, the patient has consented to  Procedure(s): FISTULA SUPERFICIALIZATION (Left) as a surgical intervention.  The patient's history has been reviewed, patient examined, no change in status, stable for surgery.  I have reviewed the patient's chart and labs.  Questions were answered to the patient's satisfaction.     Norman GORMAN Serve

## 2024-04-17 NOTE — Transfer of Care (Signed)
 Immediate Anesthesia Transfer of Care Note  Patient: Kathy [name]  Procedure(s) Performed: LEFT ARM ARTERIOVENOUS FISTULA SUPERFICIALIZATION (Left: Arm Upper)  Patient Location: PACU  Anesthesia Type:MAC and Regional  Level of Consciousness: drowsy and responds to stimulation  Airway & Oxygen Therapy: Patient Spontanous Breathing and Patient connected to face mask oxygen  Post-op Assessment: Report given to RN and Post -op Vital signs reviewed and stable  Post vital signs: Reviewed and stable  Last Vitals:  Vitals Value Taken Time  BP 114/66 04/17/24 08:54  Temp    Pulse 75 04/17/24 08:55  Resp 24 04/17/24 08:55  SpO2 96 % 04/17/24 08:55  Vitals shown include unfiled device data.  Last Pain:  Vitals:   04/17/24 0620  TempSrc:   PainSc: 4       Patients Stated Pain Goal: 4 (04/17/24 9379)  Complications: No notable events documented.

## 2024-04-18 ENCOUNTER — Encounter (HOSPITAL_COMMUNITY): Payer: Self-pay | Admitting: Vascular Surgery

## 2024-05-18 ENCOUNTER — Ambulatory Visit: Attending: Vascular Surgery | Admitting: Physician Assistant

## 2024-05-18 VITALS — BP 126/71 | HR 76 | Temp 97.7°F | Wt 217.8 lb

## 2024-05-18 DIAGNOSIS — N186 End stage renal disease: Secondary | ICD-10-CM

## 2024-05-18 NOTE — Progress Notes (Signed)
 POST OPERATIVE OFFICE NOTE    CC:  F/u for surgery  HPI:  This is a 68 y.o. female who is s/p left brachiocephalic fistula superficialization by Dr. Pearline on 04/17/2024.  She denies signs or symptoms of steal syndrome in her left hand.  She believes the incisions have healed.  She is not yet on hemodialysis.  Allergies  Allergen Reactions   Latex Itching   Xarelto  [Rivaroxaban ]     Vaginal Bleeding    Current Outpatient Medications  Medication Sig Dispense Refill   allopurinol  (ZYLOPRIM ) 100 MG tablet TAKE 2 TABLETS(200 MG) BY MOUTH DAILY 60 tablet 5   amLODipine  (NORVASC) 10 MG tablet Take 10 mg by mouth daily.     amLODipine -olmesartan  (AZOR ) 10-40 MG tablet Take 1 tablet by mouth daily. (Patient not taking: Reported on 04/12/2024) 90 tablet 3   apixaban  (ELIQUIS ) 5 MG TABS tablet TAKE 1 TABLET(5 MG) BY MOUTH TWICE DAILY 60 tablet 5   atenolol  (TENORMIN ) 50 MG tablet Take 1 tablet (50 mg total) by mouth 2 (two) times daily. 180 tablet 3   blood glucose meter kit and supplies KIT Dispense based on patient and insurance preference. Use up to four times daily as directed. (FOR ICD-9 250.00, 250.01). 1 each 0   docusate sodium (COLACE) 100 MG capsule Take 100 mg by mouth daily.     ferrous sulfate 325 (65 FE) MG EC tablet Take 325 mg by mouth 2 (two) times daily.     glucose blood test strip Contour Next test strips. Use as instructed 100 each 6   hydrALAZINE  (APRESOLINE ) 50 MG tablet Take 50 mg by mouth 2 (two) times daily.     Insulin  Lispro Prot & Lispro (HUMALOG  MIX 75/25 KWIKPEN) (75-25) 100 UNIT/ML Kwikpen Inject 10 Units into the skin daily before breakfast AND 14 Units daily before supper. (Patient taking differently: Inject 5 Units into the skin daily before breakfast AND 7 Units daily before supper.) 30 mL 3   Insulin  Pen Needle 32G X 4 MM MISC 1 Device by Does not apply route in the morning and at bedtime. 200 each 3   LOKELMA 10 g PACK packet Take 10 g by mouth 3 (three) times  a week.     Multiple Vitamin (MULTIVITAMIN WITH MINERALS) TABS tablet Take 1 tablet by mouth daily.     oxyCODONE -acetaminophen  (PERCOCET) 5-325 MG tablet Take 1 tablet by mouth every 6 (six) hours as needed. 20 tablet 0   OZEMPIC , 0.25 OR 0.5 MG/DOSE, 2 MG/3ML SOPN INJECT 0.5 MG UNDER THE SKIN ONCE WEEKLY 3 mL 3   rosuvastatin  (CRESTOR ) 10 MG tablet Take 1 tablet (10 mg total) by mouth daily. 90 tablet 2   vitamin C (ASCORBIC ACID) 250 MG tablet Take 250 mg by mouth daily.     No current facility-administered medications for this visit.     ROS:  See HPI  Physical Exam:  Vitals:   05/18/24 1005  BP: 126/71  Pulse: 76  Temp: 97.7 F (36.5 C)  TempSrc: Temporal  Weight: 217 lb 12.8 oz (98.8 kg)    Incision: Incisions of the left arm healed Extremities: Palpable left radial pulse; palpable peripheral from the anastomosis to the mid upper arm Neuro: A&O  Assessment/Plan:  This is a 68 y.o. female who is s/p: Superficialization of the left arm brachiocephalic fistula  Patent left brachiocephalic fistula without signs or symptoms of steal syndrome.  She has a palpable radial pulse at the wrist.  AV fistula has  a nice thrill in the upper arm.  In the event that hemodialysis is initiated he will be ok to cannulate her fistula.  Mrs. Mccravy can follow-up on an as-needed basis.   Donnice Sender, PA-C [facility] [phone]  Clinic MD:  Pearline

## 2024-05-31 ENCOUNTER — Other Ambulatory Visit: Payer: Self-pay | Admitting: Internal Medicine

## 2024-06-03 ENCOUNTER — Encounter (HOSPITAL_COMMUNITY): Payer: Self-pay | Admitting: Emergency Medicine

## 2024-06-03 ENCOUNTER — Other Ambulatory Visit: Payer: Self-pay

## 2024-06-03 ENCOUNTER — Emergency Department (HOSPITAL_COMMUNITY)
Admission: EM | Admit: 2024-06-03 | Discharge: 2024-06-03 | Disposition: A | Discharge status: Home / Self Care | Attending: Emergency Medicine | Admitting: Emergency Medicine

## 2024-06-03 DIAGNOSIS — Z794 Long term (current) use of insulin: Secondary | ICD-10-CM | POA: Insufficient documentation

## 2024-06-03 DIAGNOSIS — Z91040 Latex allergy status: Secondary | ICD-10-CM | POA: Diagnosis not present

## 2024-06-03 DIAGNOSIS — Z79899 Other long term (current) drug therapy: Secondary | ICD-10-CM | POA: Diagnosis not present

## 2024-06-03 DIAGNOSIS — E11649 Type 2 diabetes mellitus with hypoglycemia without coma: Secondary | ICD-10-CM | POA: Insufficient documentation

## 2024-06-03 DIAGNOSIS — N189 Chronic kidney disease, unspecified: Secondary | ICD-10-CM | POA: Diagnosis not present

## 2024-06-03 DIAGNOSIS — R42 Dizziness and giddiness: Secondary | ICD-10-CM | POA: Diagnosis not present

## 2024-06-03 DIAGNOSIS — D649 Anemia, unspecified: Secondary | ICD-10-CM | POA: Insufficient documentation

## 2024-06-03 DIAGNOSIS — D509 Iron deficiency anemia, unspecified: Secondary | ICD-10-CM

## 2024-06-03 DIAGNOSIS — I4891 Unspecified atrial fibrillation: Secondary | ICD-10-CM | POA: Diagnosis not present

## 2024-06-03 DIAGNOSIS — I129 Hypertensive chronic kidney disease with stage 1 through stage 4 chronic kidney disease, or unspecified chronic kidney disease: Secondary | ICD-10-CM | POA: Insufficient documentation

## 2024-06-03 DIAGNOSIS — Z7901 Long term (current) use of anticoagulants: Secondary | ICD-10-CM | POA: Insufficient documentation

## 2024-06-03 DIAGNOSIS — E162 Hypoglycemia, unspecified: Secondary | ICD-10-CM

## 2024-06-03 LAB — CBC WITH DIFFERENTIAL/PLATELET
Abs Immature Granulocytes: 0 K/uL (ref 0.00–0.07)
Basophils Absolute: 0 K/uL (ref 0.0–0.1)
Basophils Relative: 1 %
Eosinophils Absolute: 0.1 K/uL (ref 0.0–0.5)
Eosinophils Relative: 2 %
HCT: 31.4 % — ABNORMAL LOW (ref 36.0–46.0)
Hemoglobin: 9.6 g/dL — ABNORMAL LOW (ref 12.0–15.0)
Immature Granulocytes: 0 %
Lymphocytes Relative: 19 %
Lymphs Abs: 1.1 K/uL (ref 0.7–4.0)
MCH: 24 pg — ABNORMAL LOW (ref 26.0–34.0)
MCHC: 30.6 g/dL (ref 30.0–36.0)
MCV: 78.5 fL — ABNORMAL LOW (ref 80.0–100.0)
Monocytes Absolute: 0.5 K/uL (ref 0.1–1.0)
Monocytes Relative: 8 %
Neutro Abs: 4.3 K/uL (ref 1.7–7.7)
Neutrophils Relative %: 70 %
Platelets: 268 K/uL (ref 150–400)
RBC: 4 MIL/uL (ref 3.87–5.11)
RDW: 18.9 % — ABNORMAL HIGH (ref 11.5–15.5)
WBC: 6 K/uL (ref 4.0–10.5)
nRBC: 0 % (ref 0.0–0.2)

## 2024-06-03 LAB — BASIC METABOLIC PANEL WITH GFR
Anion gap: 11 (ref 5–15)
BUN: 38 mg/dL — ABNORMAL HIGH (ref 8–23)
CO2: 20 mmol/L — ABNORMAL LOW (ref 22–32)
Calcium: 8.9 mg/dL (ref 8.9–10.3)
Chloride: 105 mmol/L (ref 98–111)
Creatinine, Ser: 3.35 mg/dL — ABNORMAL HIGH (ref 0.44–1.00)
GFR, Estimated: 14 mL/min — ABNORMAL LOW (ref >60)
Glucose, Bld: 65 mg/dL — ABNORMAL LOW (ref 70–99)
Potassium: 3.8 mmol/L (ref 3.5–5.1)
Sodium: 136 mmol/L (ref 135–145)

## 2024-06-03 LAB — TROPONIN I (HIGH SENSITIVITY): Troponin I (High Sensitivity): 9 ng/L (ref <18)

## 2024-06-03 LAB — MAGNESIUM: Magnesium: 2.6 mg/dL — ABNORMAL HIGH (ref 1.7–2.4)

## 2024-06-03 MED ORDER — ONDANSETRON HCL 4 MG/2ML IJ SOLN
4.0000 mg | Freq: Once | INTRAMUSCULAR | Status: AC
  Administered 2024-06-03: 4 mg via INTRAVENOUS
  Filled 2024-06-03: qty 2

## 2024-06-03 NOTE — ED Triage Notes (Signed)
 Pt arrived to ED c/o feeling faint since about 6pm last night. Denies syncopal episode, denies pain. States she just doesn't feel right.

## 2024-06-03 NOTE — ED Notes (Signed)
 Patient provided with apple juice and graham crackers due to blood glucose

## 2024-06-03 NOTE — ED Provider Notes (Signed)
 [place] EMERGENCY DEPARTMENT AT [facility] Provider Note   CSN: [id number] Arrival date & time: 06/03/24  9884     Patient presents with: Feeling Faint   Kathy [name] is a 68 y.o. female.   The history is provided by the patient.   She has history of hypertension, diabetes, hyperlipidemia, chronic kidney disease, atrial fibrillation anticoagulated on apixaban  and comes in because of dizziness which started about 8 PM.  She describes a lightheaded feeling with some nausea.  She denies chest pain, heaviness, tightness, pressure.  She denies any palpitations.  She denies dyspnea or diaphoresis.  Symptoms were worse when standing.  Symptoms are improving, but still present.  Of note, she had taken a stool softener earlier in the day because she was constipated.    Prior to Admission medications   Medication Sig Start Date End Date Taking? Authorizing Provider  allopurinol  (ZYLOPRIM ) 100 MG tablet TAKE 2 TABLETS(200 MG) BY MOUTH DAILY 03/14/24   Tysinger, Alm RAMAN, PA-C  amLODipine  (NORVASC) 10 MG tablet Take 10 mg by mouth daily. 02/23/24   [provider]  amLODipine -olmesartan  (AZOR ) 10-40 MG tablet Take 1 tablet by mouth daily. Patient not taking: Reported on 04/12/2024 02/16/23   Bulah Alm RAMAN, PA-C  apixaban  (ELIQUIS ) 5 MG TABS tablet TAKE 1 TABLET(5 MG) BY MOUTH TWICE DAILY 04/16/24   Debera Jayson MATSU, MD  atenolol  (TENORMIN ) 50 MG tablet Take 1 tablet (50 mg total) by mouth 2 (two) times daily. 06/17/23   Strader, Laymon HERO, PA-C  blood glucose meter kit and supplies KIT Dispense based on patient and insurance preference. Use up to four times daily as directed. (FOR ICD-9 250.00, 250.01). 03/14/18   Tysinger, Alm RAMAN, PA-C  docusate sodium (COLACE) 100 MG capsule Take 100 mg by mouth daily.    [provider]  ferrous sulfate 325 (65 FE) MG EC tablet Take 325 mg by mouth 2 (two) times daily.    [provider]  glucose blood test strip  Contour Next test strips. Use as instructed 05/10/19   Tysinger, Alm RAMAN, PA-C  hydrALAZINE  (APRESOLINE ) 50 MG tablet Take 50 mg by mouth 2 (two) times daily. 04/22/23   [provider]  Insulin  Lispro Prot & Lispro (HUMALOG  MIX 75/25 KWIKPEN) (75-25) 100 UNIT/ML Kwikpen Humalog  MIX 10 units with Breakfast and 14 units with Supper 05/31/24   Shamleffer, Ibtehal Jaralla, MD  Insulin  Pen Needle 32G X 4 MM MISC 1 Device by Does not apply route in the morning and at bedtime. 12/26/23   Shamleffer, Ibtehal Jaralla, MD  LOKELMA 10 g PACK packet Take 10 g by mouth 3 (three) times a week. 01/02/24   [provider]  Multiple Vitamin (MULTIVITAMIN WITH MINERALS) TABS tablet Take 1 tablet by mouth daily.    [provider]  oxyCODONE -acetaminophen  (PERCOCET) 5-325 MG tablet Take 1 tablet by mouth every 6 (six) hours as needed. 04/17/24   Rhyne, Samantha J, PA-C  OZEMPIC , 0.25 OR 0.5 MG/DOSE, 2 MG/3ML SOPN INJECT 0.5 MG UNDER THE SKIN ONCE WEEKLY 03/13/24   Shamleffer, Ibtehal Jaralla, MD  rosuvastatin  (CRESTOR ) 10 MG tablet Take 1 tablet (10 mg total) by mouth daily. 03/05/24 03/05/25  Tysinger, Alm RAMAN, PA-C  vitamin C (ASCORBIC ACID) 250 MG tablet Take 250 mg by mouth daily.    [provider]    Allergies: Latex and Xarelto  [rivaroxaban ]    Review of Systems  All other systems reviewed and are negative.   Updated Vital  Signs BP (!) 145/58   Pulse 71   Temp 97.9 F (36.6 C) (Oral)   Resp 20   Ht 5' 8 (1.727 m)   Wt 97.5 kg   SpO2 94%   BMI 32.69 kg/m   Physical Exam Vitals and nursing note reviewed.   68 year old female, resting comfortably and in no acute distress. Vital signs are significant for mildly elevated blood pressure. Oxygen saturation is 94%, which is normal. Head is normocephalic and atraumatic. PERRLA, EOMI.  Neck is nontender and supple without adenopathy. Lungs are clear without rales, wheezes, or rhonchi. Heart has regular rate and rhythm without  murmur. Abdomen is soft, flat, nontender. Extremities have no cyanosis or edema, full range of motion is present. Skin is warm and dry without rash. Neurologic: Mental status is normal, cranial nerves are intact, moves all extremities equally.  Dizziness is not reproduced by passive head movement.  (all labs ordered are listed, but only abnormal results are displayed) Labs Reviewed - No data to display  EKG: EKG Interpretation Date/Time:  Sunday June 03 2024 01:45:31 EDT Ventricular Rate:  70 PR Interval:  226 QRS Duration:  114 QT Interval:  502 QTC Calculation: 542 R Axis:   60  Text Interpretation: Sinus rhythm Prolonged PR interval Borderline intraventricular conduction delay Borderline repolarization abnormality Prolonged QT interval When compared with ECG of 02/01/2024, No significant change was found Confirmed by Raford Lenis ([id number]) on 06/03/2024 1:50:13 AM  Radiology: No results found.   Procedures   Medications Ordered in the ED - No data to display                                  Medical Decision Making Amount and/or Complexity of Data Reviewed Labs: ordered.  Risk Prescription drug management.   Dizziness, etiology unclear.  This is a presentation with a wide range of treatment options and carries with it a high risk of morbidity and complications.  Differential diagnosis includes, but is not limited to, hypotension, hypovolemia, arrhythmia, ACS, vertigo.  I have reviewed her electrocardiogram, my interpretation is sinus rhythm with borderline repolarization abnormality not significantly changed from prior.  I have ordered orthostatic vital signs and screening labs including troponin.  I have reviewed her past records, and note cardiology office visit on 02/16/2024 at which time she was maintained on current medications and had no acute complaints.  Echocardiogram on 03/19/2024 showed left ventricular ejection fraction 50% and indeterminant left ventricular  diastolic parameters.  There was global hypokinesis.  I have reviewed her laboratory tests, and my interpretation is stable renal insufficiency, magnesium level mildly elevated, microcytic anemia with hemoglobin having dropped by 1.6 g compared with 04/14/2024.  Also, slightly low glucose level.  I did a rectal exam which showed dark stool, but it is Hemoccult negative.  Orthostatic vital signs showed no significant change in pulse or blood pressure.  Cause for drop in hemoglobin is unclear, but will need to be pursued as an outpatient.     Final diagnoses:  Dizziness  Microcytic anemia  Chronic anticoagulation  Hypoglycemia    ED Discharge Orders     None          Raford Lenis, MD 06/03/24 [phone]

## 2024-06-03 NOTE — Discharge Instructions (Addendum)
 Your blood work shows that your hemoglobin has dropped since it was last checked on August 5.  He will need to work with your primary care provider to try to identify why the hemoglobin is dropped.  However, testing today did not show any bleeding into your bowels.  Please make an appointment to see your primary care provider soon as possible.  If you have recurrence of your dizziness, or if you develop chest pain or shortness of breath, please return to the emergency department immediately.

## 2024-06-04 DIAGNOSIS — N185 Chronic kidney disease, stage 5: Secondary | ICD-10-CM | POA: Diagnosis not present

## 2024-06-10 ENCOUNTER — Emergency Department (HOSPITAL_COMMUNITY)

## 2024-06-10 ENCOUNTER — Inpatient Hospital Stay (HOSPITAL_COMMUNITY)
Admission: EM | Admit: 2024-06-10 | Discharge: 2024-06-13 | DRG: 291 | Disposition: A | Discharge status: Home / Self Care | Attending: Internal Medicine | Admitting: Internal Medicine

## 2024-06-10 ENCOUNTER — Encounter (HOSPITAL_COMMUNITY): Payer: Self-pay

## 2024-06-10 ENCOUNTER — Other Ambulatory Visit: Payer: Self-pay

## 2024-06-10 DIAGNOSIS — I5031 Acute diastolic (congestive) heart failure: Secondary | ICD-10-CM | POA: Diagnosis present

## 2024-06-10 DIAGNOSIS — E7849 Other hyperlipidemia: Secondary | ICD-10-CM | POA: Diagnosis not present

## 2024-06-10 DIAGNOSIS — E1122 Type 2 diabetes mellitus with diabetic chronic kidney disease: Secondary | ICD-10-CM | POA: Diagnosis present

## 2024-06-10 DIAGNOSIS — D631 Anemia in chronic kidney disease: Secondary | ICD-10-CM | POA: Diagnosis present

## 2024-06-10 DIAGNOSIS — I10 Essential (primary) hypertension: Secondary | ICD-10-CM | POA: Diagnosis not present

## 2024-06-10 DIAGNOSIS — E1169 Type 2 diabetes mellitus with other specified complication: Secondary | ICD-10-CM | POA: Diagnosis not present

## 2024-06-10 DIAGNOSIS — Z79899 Other long term (current) drug therapy: Secondary | ICD-10-CM | POA: Diagnosis not present

## 2024-06-10 DIAGNOSIS — E1165 Type 2 diabetes mellitus with hyperglycemia: Secondary | ICD-10-CM | POA: Diagnosis not present

## 2024-06-10 DIAGNOSIS — E1129 Type 2 diabetes mellitus with other diabetic kidney complication: Secondary | ICD-10-CM | POA: Diagnosis present

## 2024-06-10 DIAGNOSIS — Z794 Long term (current) use of insulin: Secondary | ICD-10-CM

## 2024-06-10 DIAGNOSIS — I480 Paroxysmal atrial fibrillation: Secondary | ICD-10-CM | POA: Diagnosis not present

## 2024-06-10 DIAGNOSIS — Z7901 Long term (current) use of anticoagulants: Secondary | ICD-10-CM

## 2024-06-10 DIAGNOSIS — E785 Hyperlipidemia, unspecified: Secondary | ICD-10-CM | POA: Diagnosis not present

## 2024-06-10 DIAGNOSIS — Z7985 Long-term (current) use of injectable non-insulin antidiabetic drugs: Secondary | ICD-10-CM

## 2024-06-10 DIAGNOSIS — E872 Acidosis, unspecified: Secondary | ICD-10-CM | POA: Diagnosis not present

## 2024-06-10 DIAGNOSIS — R059 Cough, unspecified: Secondary | ICD-10-CM | POA: Diagnosis not present

## 2024-06-10 DIAGNOSIS — R0600 Dyspnea, unspecified: Secondary | ICD-10-CM | POA: Diagnosis not present

## 2024-06-10 DIAGNOSIS — J811 Chronic pulmonary edema: Principal | ICD-10-CM | POA: Diagnosis present

## 2024-06-10 DIAGNOSIS — Z823 Family history of stroke: Secondary | ICD-10-CM

## 2024-06-10 DIAGNOSIS — Z23 Encounter for immunization: Secondary | ICD-10-CM

## 2024-06-10 DIAGNOSIS — Z1152 Encounter for screening for COVID-19: Secondary | ICD-10-CM

## 2024-06-10 DIAGNOSIS — N185 Chronic kidney disease, stage 5: Secondary | ICD-10-CM | POA: Diagnosis not present

## 2024-06-10 DIAGNOSIS — Z833 Family history of diabetes mellitus: Secondary | ICD-10-CM | POA: Diagnosis not present

## 2024-06-10 DIAGNOSIS — Z8619 Personal history of other infectious and parasitic diseases: Secondary | ICD-10-CM

## 2024-06-10 DIAGNOSIS — Z888 Allergy status to other drugs, medicaments and biological substances status: Secondary | ICD-10-CM | POA: Diagnosis not present

## 2024-06-10 DIAGNOSIS — Z91040 Latex allergy status: Secondary | ICD-10-CM

## 2024-06-10 DIAGNOSIS — M109 Gout, unspecified: Secondary | ICD-10-CM | POA: Diagnosis present

## 2024-06-10 DIAGNOSIS — I132 Hypertensive heart and chronic kidney disease with heart failure and with stage 5 chronic kidney disease, or end stage renal disease: Secondary | ICD-10-CM | POA: Diagnosis not present

## 2024-06-10 DIAGNOSIS — J810 Acute pulmonary edema: Secondary | ICD-10-CM | POA: Diagnosis not present

## 2024-06-10 DIAGNOSIS — R531 Weakness: Principal | ICD-10-CM

## 2024-06-10 DIAGNOSIS — Z6834 Body mass index (BMI) 34.0-34.9, adult: Secondary | ICD-10-CM

## 2024-06-10 DIAGNOSIS — E739 Lactose intolerance, unspecified: Secondary | ICD-10-CM | POA: Diagnosis not present

## 2024-06-10 DIAGNOSIS — Z803 Family history of malignant neoplasm of breast: Secondary | ICD-10-CM | POA: Diagnosis not present

## 2024-06-10 DIAGNOSIS — R0602 Shortness of breath: Secondary | ICD-10-CM | POA: Diagnosis not present

## 2024-06-10 DIAGNOSIS — J90 Pleural effusion, not elsewhere classified: Secondary | ICD-10-CM | POA: Diagnosis not present

## 2024-06-10 LAB — CBC WITH DIFFERENTIAL/PLATELET
Abs Immature Granulocytes: 0.02 K/uL (ref 0.00–0.07)
Basophils Absolute: 0 K/uL (ref 0.0–0.1)
Basophils Relative: 1 %
Eosinophils Absolute: 0.3 K/uL (ref 0.0–0.5)
Eosinophils Relative: 4 %
HCT: 35.9 % — ABNORMAL LOW (ref 36.0–46.0)
Hemoglobin: 11.1 g/dL — ABNORMAL LOW (ref 12.0–15.0)
Immature Granulocytes: 0 %
Lymphocytes Relative: 19 %
Lymphs Abs: 1.5 K/uL (ref 0.7–4.0)
MCH: 24.5 pg — ABNORMAL LOW (ref 26.0–34.0)
MCHC: 30.9 g/dL (ref 30.0–36.0)
MCV: 79.2 fL — ABNORMAL LOW (ref 80.0–100.0)
Monocytes Absolute: 0.5 K/uL (ref 0.1–1.0)
Monocytes Relative: 7 %
Neutro Abs: 5.3 K/uL (ref 1.7–7.7)
Neutrophils Relative %: 69 %
Platelets: 377 K/uL (ref 150–400)
RBC: 4.53 MIL/uL (ref 3.87–5.11)
RDW: 20.7 % — ABNORMAL HIGH (ref 11.5–15.5)
WBC: 7.7 K/uL (ref 4.0–10.5)
nRBC: 0 % (ref 0.0–0.2)

## 2024-06-10 LAB — COMPREHENSIVE METABOLIC PANEL WITH GFR
ALT: 25 U/L (ref 0–44)
AST: 24 U/L (ref 15–41)
Albumin: 3.5 g/dL (ref 3.5–5.0)
Alkaline Phosphatase: 111 U/L (ref 38–126)
Anion gap: 17 — ABNORMAL HIGH (ref 5–15)
BUN: 34 mg/dL — ABNORMAL HIGH (ref 8–23)
CO2: 16 mmol/L — ABNORMAL LOW (ref 22–32)
Calcium: 9.6 mg/dL (ref 8.9–10.3)
Chloride: 106 mmol/L (ref 98–111)
Creatinine, Ser: 3.64 mg/dL — ABNORMAL HIGH (ref 0.44–1.00)
GFR, Estimated: 13 mL/min — ABNORMAL LOW (ref >60)
Glucose, Bld: 120 mg/dL — ABNORMAL HIGH (ref 70–99)
Potassium: 4.4 mmol/L (ref 3.5–5.1)
Sodium: 139 mmol/L (ref 135–145)
Total Bilirubin: 0.8 mg/dL (ref 0.0–1.2)
Total Protein: 7.8 g/dL (ref 6.5–8.1)

## 2024-06-10 LAB — URINALYSIS, W/ REFLEX TO CULTURE (INFECTION SUSPECTED)
Bilirubin Urine: NEGATIVE
Glucose, UA: NEGATIVE mg/dL
Hgb urine dipstick: NEGATIVE
Ketones, ur: NEGATIVE mg/dL
Nitrite: NEGATIVE
Protein, ur: >=300 mg/dL — AB
Specific Gravity, Urine: 1.008 (ref 1.005–1.030)
pH: 6 (ref 5.0–8.0)

## 2024-06-10 LAB — RESP PANEL BY RT-PCR (RSV, FLU A&B, COVID)  RVPGX2
Influenza A by PCR: NEGATIVE
Influenza B by PCR: NEGATIVE
Resp Syncytial Virus by PCR: NEGATIVE
SARS Coronavirus 2 by RT PCR: NEGATIVE

## 2024-06-10 MED ORDER — FUROSEMIDE 10 MG/ML IJ SOLN
40.0000 mg | Freq: Once | INTRAMUSCULAR | Status: AC
  Administered 2024-06-10: 40 mg via INTRAVENOUS
  Filled 2024-06-10: qty 4

## 2024-06-10 MED ORDER — FUROSEMIDE 10 MG/ML IJ SOLN: 40.0000 mg | Freq: Two times a day (BID) | INTRAMUSCULAR | Status: DC

## 2024-06-10 MED ORDER — ACETAMINOPHEN 650 MG RE SUPP: 650.0000 mg | Freq: Four times a day (QID) | RECTAL | Status: DC | PRN

## 2024-06-10 MED ORDER — ACETAMINOPHEN 325 MG PO TABS: 650.0000 mg | ORAL_TABLET | Freq: Four times a day (QID) | ORAL | Status: DC | PRN

## 2024-06-10 MED ORDER — SENNOSIDES-DOCUSATE SODIUM 8.6-50 MG PO TABS: 1.0000 | ORAL_TABLET | Freq: Every evening | ORAL | Status: DC | PRN

## 2024-06-10 MED ORDER — HEPARIN SODIUM (PORCINE) 5000 UNIT/ML IJ SOLN
5000.0000 [IU] | Freq: Three times a day (TID) | INTRAMUSCULAR | Status: DC
  Administered 2024-06-10: 5000 [IU] via SUBCUTANEOUS
  Filled 2024-06-10: qty 1

## 2024-06-10 NOTE — ED Provider Notes (Signed)
  Physical Exam  BP (!) 159/77   Pulse 77   Temp 98.7 F (37.1 C) (Oral)   Resp (!) 26   Ht 5' 8 (1.727 m)   Wt 97.5 kg   SpO2 93%   BMI 32.69 kg/m   Physical Exam  Procedures  Procedures  ED Course / MDM   Clinical Course as of 06/10/24 1745  Sun Jun 10, 2024  1528 Assumed care from Dr Laurice. 68 yo F with CKD and fistula that was placed in August 5th (has not started HD). Pw mild dyspnea and cough. Likely viral syndrome. Saw Dr Raford 1 week ago. Labs are at baseline. CXR with possible pleural effusions and pulmonary edema. Suspect some volume overload. Awaiting CT chest to distinguish between infection and edema. On room air and appears comfortable.  [RP]  1712 Dr Tobie recommends initiating on diuretics.  [RP]    Clinical Course User Index [RP] Yolande Lamar BROCKS, MD   Medical Decision Making Amount and/or Complexity of Data Reviewed Labs: ordered. Radiology: ordered.  Risk Prescription drug management.   ***

## 2024-06-10 NOTE — H&P (Signed)
 History and Physical    Kathy [name] FMW:[id number] DOB: 1955-12-23 DOA: 06/10/2024  DOS: the patient was seen and examined on 06/10/2024  PCP: Bulah Alm RAMAN, PA-C   Patient coming from: Home  I have personally briefly reviewed patient's old medical records in [facility] and CareEverywhere  HPI:  Kathy [name] is a 68 y.o. year old female with past medical history of hypertension, hyperlipidemia, type 2 diabetes, ESRD not on HD presenting to ED concern of dyspnea and cough. She states she has fatigue, shortness of breath, coughing and congestion. No myalgias. Pt states she has worsening swelling in her legs and feet.   She states she is peeing a little bit less than usual which is roughly 700 cc daily. She tries to drink about 64 oz of water daily.   ED Course: Patient presented to the ED and was hemodynamically stable.  Imaging suggested pulmonary edema with possible pleural effusions.  CT showed free-flowing bilateral pleural effusions with around 500 cc of fluid and associated atelectasis.  CBC showed chronic stable anemia.  CMP showed normal electrolytes but some acidosis.  BUN is elevated but stable.  Given the volume overload image findings patient warranting admission.  TRH contacted for admission.  Review of Systems: As mentioned in the history of present illness. All other systems reviewed and are negative.   Past Medical History:  Diagnosis Date   Atrial fibrillation (HCC) 05/2016   Chronic kidney disease    Diabetes mellitus without complication (HCC)    type II   History of Helicobacter pylori infection    Hyperlipidemia    Hypertension    Microalbuminuria 2016   Obesity     Past Surgical History:  Procedure Laterality Date   AV FISTULA PLACEMENT Left 02/01/2024   Procedure: LEFT ARTERIOVENOUS (AV) FISTULA CREATION;  Surgeon: Pearline Norman RAMAN, MD;  Location: [facility] OR;  Service: Vascular;  Laterality: Left;   COLONOSCOPY     age 66yo   FISTULA  SUPERFICIALIZATION Left 04/17/2024   Procedure: LEFT ARM ARTERIOVENOUS FISTULA SUPERFICIALIZATION;  Surgeon: Pearline Norman RAMAN, MD;  Location: [facility] OR;  Service: Vascular;  Laterality: Left;   TONSILLECTOMY       reports that she has never smoked. She has never used smokeless tobacco. She reports that she does not currently use alcohol. She reports that she does not use drugs.  Allergies  Allergen Reactions   Latex Itching   Xarelto  [Rivaroxaban ]     Vaginal Bleeding    Family History  Problem Relation Age of Onset   Stroke Mother    Cancer Father        throat   Diabetes Sister    Diabetes Sister    Diabetes Sister    Cancer Sister        breast   Breast cancer Sister    Heart disease Neg Hx     Prior to Admission medications   Medication Sig Start Date End Date Taking? Authorizing Provider  allopurinol  (ZYLOPRIM ) 100 MG tablet TAKE 2 TABLETS(200 MG) BY MOUTH DAILY 03/14/24   Tysinger, Alm RAMAN, PA-C  amLODipine  (NORVASC) 10 MG tablet Take 10 mg by mouth daily. 02/23/24   [provider]  amLODipine -olmesartan  (AZOR ) 10-40 MG tablet Take 1 tablet by mouth daily. Patient not taking: Reported on 04/12/2024 02/16/23   Bulah Alm RAMAN, PA-C  apixaban  (ELIQUIS ) 5 MG TABS tablet TAKE 1 TABLET(5 MG) BY MOUTH TWICE DAILY 04/16/24   Debera Jayson MATSU, MD  atenolol  (TENORMIN )  50 MG tablet Take 1 tablet (50 mg total) by mouth 2 (two) times daily. 06/17/23   Strader, Laymon HERO, PA-C  blood glucose meter kit and supplies KIT Dispense based on patient and insurance preference. Use up to four times daily as directed. (FOR ICD-9 250.00, 250.01). 03/14/18   Tysinger, Alm RAMAN, PA-C  docusate sodium (COLACE) 100 MG capsule Take 100 mg by mouth daily.    [provider]  ferrous sulfate 325 (65 FE) MG EC tablet Take 325 mg by mouth 2 (two) times daily.    [provider]  glucose blood test strip Contour Next test strips. Use as instructed 05/10/19   Tysinger, Alm RAMAN, PA-C   hydrALAZINE  (APRESOLINE ) 50 MG tablet Take 50 mg by mouth 2 (two) times daily. 04/22/23   [provider]  Insulin  Lispro Prot & Lispro (HUMALOG  MIX 75/25 KWIKPEN) (75-25) 100 UNIT/ML Kwikpen Humalog  MIX 10 units with Breakfast and 14 units with Supper 05/31/24   Shamleffer, Ibtehal Jaralla, MD  Insulin  Pen Needle 32G X 4 MM MISC 1 Device by Does not apply route in the morning and at bedtime. 12/26/23   Shamleffer, Ibtehal Jaralla, MD  LOKELMA 10 g PACK packet Take 10 g by mouth 3 (three) times a week. 01/02/24   [provider]  Multiple Vitamin (MULTIVITAMIN WITH MINERALS) TABS tablet Take 1 tablet by mouth daily.    [provider]  oxyCODONE -acetaminophen  (PERCOCET) 5-325 MG tablet Take 1 tablet by mouth every 6 (six) hours as needed. 04/17/24   Rhyne, Samantha J, PA-C  OZEMPIC , 0.25 OR 0.5 MG/DOSE, 2 MG/3ML SOPN INJECT 0.5 MG UNDER THE SKIN ONCE WEEKLY 03/13/24   Shamleffer, Ibtehal Jaralla, MD  rosuvastatin  (CRESTOR ) 10 MG tablet Take 1 tablet (10 mg total) by mouth daily. 03/05/24 03/05/25  Tysinger, Alm RAMAN, PA-C  vitamin C (ASCORBIC ACID) 250 MG tablet Take 250 mg by mouth daily.    [provider]   Social Hx: Lives with husband and son No smoking or drinking Sees Kidney doctor in [place]. PCP is Tysinger PA and sees him every 6 months.  Independent in ADLs and iADLs.   Physical Exam: Vitals:   06/10/24 1710 06/10/24 1739 06/10/24 1740 06/10/24 1800  BP:    (!) 156/74  Pulse:  79 77 72  Resp:  (!) 26 (!) 26 (!) 27  Temp: 98.7 F (37.1 C)     TempSrc: Oral     SpO2:  95% 93% 91%  Weight:      Height:        Physical Exam: Gen: Pleasant female in no acute distress HENT: Moist mucous membranes CV: Normal rate and rhythm, JVD is present, 3+ edema present  Resp: Crackles up to mid lungs, no wheeze appreciated Abd: TTP, soft abdomen MSK: No asymmetry Neuro: Alert and oriented x 4 Psych: Normal mood and affect   Labs on Admission: I have  personally reviewed following labs and imaging studies  CBC: Recent Labs  Lab 06/10/24 1343  WBC 7.7  NEUTROABS 5.3  HGB 11.1*  HCT 35.9*  MCV 79.2*  PLT 377   Basic Metabolic Panel: Recent Labs  Lab 06/10/24 1343  NA 139  K 4.4  CL 106  CO2 16*  GLUCOSE 120*  BUN 34*  CREATININE 3.64*  CALCIUM  9.6   GFR: Estimated Creatinine Clearance: 18.1 mL/min (A) (by C-G formula based on SCr of 3.64 mg/dL (H)). Liver Function Tests: Recent Labs  Lab 06/10/24 1343  AST 24  ALT 25  ALKPHOS 111  BILITOT 0.8  PROT 7.8  ALBUMIN 3.5   No results for input(s): LIPASE, AMYLASE in the last 168 hours. No results for input(s): AMMONIA in the last 168 hours. Coagulation Profile: No results for input(s): INR, PROTIME in the last 168 hours. Cardiac Enzymes: No results for input(s): CKTOTAL, CKMB, CKMBINDEX, TROPONINI, TROPONINIHS in the last 168 hours. BNP (last 3 results) No results for input(s): BNP in the last 8760 hours. HbA1C: No results for input(s): HGBA1C in the last 72 hours. CBG: No results for input(s): GLUCAP in the last 168 hours. Lipid Profile: No results for input(s): CHOL, HDL, LDLCALC, TRIG, CHOLHDL, LDLDIRECT in the last 72 hours. Thyroid  Function Tests: No results for input(s): TSH, T4TOTAL, FREET4, T3FREE, THYROIDAB in the last 72 hours. Anemia Panel: No results for input(s): VITAMINB12, FOLATE, FERRITIN, TIBC, IRON, RETICCTPCT in the last 72 hours. Urine analysis:    Component Value Date/Time   COLORURINE YELLOW 06/10/2024 1506   APPEARANCEUR CLEAR 06/10/2024 1506   LABSPEC 1.008 06/10/2024 1506   LABSPEC 1.010 03/03/2021 0928   PHURINE 6.0 06/10/2024 1506   GLUCOSEU NEGATIVE 06/10/2024 1506   HGBUR NEGATIVE 06/10/2024 1506   BILIRUBINUR NEGATIVE 06/10/2024 1506   BILIRUBINUR negative 03/03/2021 0928   BILIRUBINUR neg 07/19/2016 1053   KETONESUR NEGATIVE 06/10/2024 1506   PROTEINUR >=300 (A)  06/10/2024 1506   UROBILINOGEN negative 07/19/2016 1053   UROBILINOGEN 0.2 10/24/2009 0056   NITRITE NEGATIVE 06/10/2024 1506   LEUKOCYTESUR TRACE (A) 06/10/2024 1506    Radiological Exams on Admission: I have personally reviewed images CT CHEST WO CONTRAST Result Date: 06/10/2024 CLINICAL DATA:  Respiratory illness, nondiagnostic x-ray, short of breath and cough EXAM: CT CHEST WITHOUT CONTRAST TECHNIQUE: Multidetector CT imaging of the chest was performed following the standard protocol without IV contrast. RADIATION DOSE REDUCTION: This exam was performed according to the departmental dose-optimization program which includes automated exposure control, adjustment of the mA and/or kV according to patient size and/or use of iterative reconstruction technique. COMPARISON:  06/02/2024 FINDINGS: Cardiovascular: Unenhanced imaging of the heart demonstrates cardiomegaly without pericardial effusion. Normal caliber of the thoracic aorta. Atherosclerosis of the aorta and coronary vasculature. Assessment of the vascular lumen cannot be performed without intravenous contrast. Mediastinum/Nodes: No enlarged mediastinal or axillary lymph nodes. Thyroid  gland, trachea, and esophagus demonstrate no significant findings. Lungs/Pleura: Small free-flowing bilateral pleural effusions, volume estimated less than 500 cc each. Dense areas of consolidation and volume loss at the lung bases, favor atelectasis over airspace disease. No pneumothorax. The central airways are patent. Upper Abdomen: No acute abnormality. Musculoskeletal: No acute or destructive bony abnormalities. Reconstructed images demonstrate no additional findings. IMPRESSION: 1. Free-flowing bilateral pleural effusions, volume estimated less than 500 cc each. 2. Dense bibasilar consolidation with associated volume loss, favor atelectasis over airspace disease. 3. Cardiomegaly without pericardial effusion. 4. Aortic Atherosclerosis (ICD10-I70.0). Coronary artery  atherosclerosis. Electronically Signed   By: Ozell Daring M.D.   On: 06/10/2024 16:32   DG Chest Port 1 View Result Date: 06/10/2024 CLINICAL DATA:  sob / cough EXAM: PORTABLE CHEST 1 VIEW COMPARISON:  March 18, 2017 FINDINGS: The cardiomediastinal silhouette is unchanged in contour. Small bilateral pleural effusions. No pneumothorax. Perihilar vascular enlargement with patchy bibasilar heterogeneous opacities. IMPRESSION: Constellation of findings are favored to reflect pulmonary edema with small bilateral pleural effusions. Differential considerations include infection or aspiration. Electronically Signed   By: Corean Salter M.D.   On: 06/10/2024 14:02    EKG: My personal interpretation of  EKG shows: shows sinus rhythm with prolonged PR interval.   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  Patient is ESRD but is not currently on HD.  She has been experiencing decrease in her urination which I believe is leading to her volume overload state.  She has not been on diuretics which we will start this admission per nephrology and her volume status does not improve she may need HD this admission.  Lasix 40 mg twice daily ordered.  Will titrate based on her response.  Will get magnesium level.  Will ensure both magnesium and potassium are at goal given the diuresis.  I do anticipate her pulmonary edema and pleural effusions to decrease with diuresis.  No concerning finding to warrant a thoracentesis at this time. If no  significant improvement despite IV diuresis then nephrology will need to be consulted to initiate HD.  Hypertension: Blood pressure is elevated but this will improve with diuresis.  Current blood pressure is 156/74.  Holding home blood pressure medications.  Microcytic anemia: Anemia appears to be stable and chronic.  Hemoglobin goals greater than 10 with her kidney disease. Will order iron studies   Gout: Continue home allopurinol .  Paroxysmal A-fib: Continue home Eliquis .  Patient in sinus rhythm  Type 2 diabetes: Patient on Ozempic  and insulin  at home.  Started on SSI.  Hyperlipidemia: Continue home statin.  VTE prophylaxis: Eliquis  Diet: NPO Access: Lines: Code Status:  Full Code HCDM: Telemetry:  Admission status: Inpatient, Telemetry bed Patient is from: Home  Anticipated d/c is to: Home  Anticipated d/c date is: 2-3 days  Family Communication: Updated at bedside   Consults called: None   Severity of Illness: The appropriate patient status for this patient is INPATIENT. Inpatient status is judged to be reasonable and necessary in order to provide the required intensity of service to ensure the patient's safety. The patient's presenting symptoms, physical exam findings, and initial radiographic and laboratory data in the context of their chronic comorbidities is felt to place them at high risk for further clinical deterioration. Furthermore, it is not anticipated that the patient will be medically stable for discharge from the hospital within 2 midnights of admission.   * I certify that at the point of admission it is my clinical judgment that the patient will require inpatient hospital care spanning beyond 2 midnights from the point of admission due to high intensity of service, high risk for further deterioration and high frequency of surveillance required.DEWAINE Morene Bathe, MD [place] DEL. [facility]

## 2024-06-10 NOTE — ED Notes (Signed)
 Patient transported to CT

## 2024-06-10 NOTE — ED Notes (Signed)
 Pt c.o feeling winded while transferring to wheelchair to toilet and back.

## 2024-06-10 NOTE — ED Triage Notes (Signed)
 Pt arrived POV from home c/o generalized weakness, fatigue, congestion and a cough x2 weeks.

## 2024-06-10 NOTE — ED Provider Notes (Signed)
 [place] EMERGENCY DEPARTMENT AT [facility] Provider Note   CSN: [id number] Arrival date & time: 06/10/24  1303     Patient presents with: Weakness   Kathy [name] is a 68 y.o. female.  {Add pertinent medical, surgical, social history, OB history to HPI:333} 68 year old female with prior medical history including hypertension, diabetes, hyperlipidemia, chronic kidney disease, atrial fibrillation anticoagulated on apixaban .  Patient reports persistent weakness and fatigue x 2 weeks.  She was seen for same complaint 1 week ago at [facility].  Workup there did not demonstrate significant acute abnormality.  Patient was noted to be mildly anemic.  Patient takes iron for her anemia.  She has a history of chronic anemia.  She reports mild cough and congestion.  She reports mild dyspnea with exertion.  She denies fever.  She reports that her husband had similar symptoms last week but he got better.  The history is provided by the patient and medical records.       Prior to Admission medications   Medication Sig Start Date End Date Taking? Authorizing Provider  allopurinol  (ZYLOPRIM ) 100 MG tablet TAKE 2 TABLETS(200 MG) BY MOUTH DAILY 03/14/24   Tysinger, Alm RAMAN, PA-C  amLODipine  (NORVASC) 10 MG tablet Take 10 mg by mouth daily. 02/23/24   [provider]  amLODipine -olmesartan  (AZOR ) 10-40 MG tablet Take 1 tablet by mouth daily. Patient not taking: Reported on 04/12/2024 02/16/23   Tysinger, Alm RAMAN, PA-C  apixaban  (ELIQUIS ) 5 MG TABS tablet TAKE 1 TABLET(5 MG) BY MOUTH TWICE DAILY 04/16/24   Debera Jayson MATSU, MD  atenolol  (TENORMIN ) 50 MG tablet Take 1 tablet (50 mg total) by mouth 2 (two) times daily. 06/17/23   Strader, Laymon HERO, PA-C  blood glucose meter kit and supplies KIT Dispense based on patient and insurance preference. Use up to four times daily as directed. (FOR ICD-9 250.00, 250.01). 03/14/18   Tysinger, Alm RAMAN, PA-C  docusate sodium (COLACE) 100 MG  capsule Take 100 mg by mouth daily.    [provider]  ferrous sulfate 325 (65 FE) MG EC tablet Take 325 mg by mouth 2 (two) times daily.    [provider]  glucose blood test strip Contour Next test strips. Use as instructed 05/10/19   Tysinger, Alm RAMAN, PA-C  hydrALAZINE  (APRESOLINE ) 50 MG tablet Take 50 mg by mouth 2 (two) times daily. 04/22/23   [provider]  Insulin  Lispro Prot & Lispro (HUMALOG  MIX 75/25 KWIKPEN) (75-25) 100 UNIT/ML Kwikpen Humalog  MIX 10 units with Breakfast and 14 units with Supper 05/31/24   Shamleffer, Ibtehal Jaralla, MD  Insulin  Pen Needle 32G X 4 MM MISC 1 Device by Does not apply route in the morning and at bedtime. 12/26/23   Shamleffer, Ibtehal Jaralla, MD  LOKELMA 10 g PACK packet Take 10 g by mouth 3 (three) times a week. 01/02/24   [provider]  Multiple Vitamin (MULTIVITAMIN WITH MINERALS) TABS tablet Take 1 tablet by mouth daily.    [provider]  oxyCODONE -acetaminophen  (PERCOCET) 5-325 MG tablet Take 1 tablet by mouth every 6 (six) hours as needed. 04/17/24   Rhyne, Samantha J, PA-C  OZEMPIC , 0.25 OR 0.5 MG/DOSE, 2 MG/3ML SOPN INJECT 0.5 MG UNDER THE SKIN ONCE WEEKLY 03/13/24   Shamleffer, Ibtehal Jaralla, MD  rosuvastatin  (CRESTOR ) 10 MG tablet Take 1 tablet (10 mg total) by mouth daily. 03/05/24 03/05/25  Tysinger, Alm RAMAN, PA-C  vitamin C (ASCORBIC ACID) 250 MG tablet Take 250 mg  by mouth daily.    [provider]    Allergies: Latex and Xarelto  [rivaroxaban ]    Review of Systems  All other systems reviewed and are negative.   Updated Vital Signs BP (!) 156/59   Pulse 72   Temp 98.7 F (37.1 C)   Resp 18   Ht 5' 8 (1.727 m)   Wt 97.5 kg   SpO2 91%   BMI 32.69 kg/m   Physical Exam Vitals and nursing note reviewed.  Constitutional:      General: She is not in acute distress.    Appearance: Normal appearance. She is well-developed.  HENT:     Head: Normocephalic and atraumatic.  Eyes:      Conjunctiva/sclera: Conjunctivae normal.     Pupils: Pupils are equal, round, and reactive to light.  Cardiovascular:     Rate and Rhythm: Normal rate and regular rhythm.     Heart sounds: Normal heart sounds.  Pulmonary:     Effort: Pulmonary effort is normal. No respiratory distress.     Breath sounds: Normal breath sounds.  Abdominal:     General: There is no distension.     Palpations: Abdomen is soft.     Tenderness: There is no abdominal tenderness.  Musculoskeletal:        General: No deformity. Normal range of motion.     Cervical back: Normal range of motion and neck supple.  Skin:    General: Skin is warm and dry.  Neurological:     General: No focal deficit present.     Mental Status: She is alert and oriented to person, place, and time.     (all labs ordered are listed, but only abnormal results are displayed) Labs Reviewed  RESP PANEL BY RT-PCR (RSV, FLU A&B, COVID)  [id number]  COMPREHENSIVE METABOLIC PANEL WITH GFR  CBC WITH DIFFERENTIAL/PLATELET  URINALYSIS, W/ REFLEX TO CULTURE (INFECTION SUSPECTED)    EKG: None  Radiology: No results found.  {Document cardiac monitor, telemetry assessment procedure when appropriate:32947} Procedures   Medications Ordered in the ED - No data to display    {Click here for ABCD2, HEART and other calculators REFRESH Note before signing:1}                              Medical Decision Making Amount and/or Complexity of Data Reviewed Labs: ordered. Radiology: ordered.   ***  {Document critical care time when appropriate  Document review of labs and clinical decision tools ie CHADS2VASC2, etc  Document your independent review of radiology images and any outside records  Document your discussion with family members, caretakers and with consultants  Document social determinants of health affecting pt's care  Document your decision making why or why not admission, treatments were needed:32947:::1}   Final  diagnoses:  None    ED Discharge Orders     None

## 2024-06-10 NOTE — H&P (Incomplete Revision)
 History and Physical    Kathy [name] FMW:[id number] DOB: 1955-12-23 DOA: 06/10/2024  DOS: the patient was seen and examined on 06/10/2024  PCP: Bulah Alm RAMAN, PA-C   Patient coming from: Home  I have personally briefly reviewed patient's old medical records in [facility] and Care[facility]where  HPI:  Kathy [name] is a 68 y.o. year old female with past medical history of hypertension, hyperlipidemia, type 2 diabetes, ESRD not on HD presenting to ED concern of dyspnea and cough. She states she has fatigue, shortness of breath, coughing and congestion. No myalgias. Pt states she has worsening swelling in her legs and feet.   She states she is peeing a little bit less than usual which is roughly 700 cc daily. She tries to drink about 64 oz of water daily.   ED Course: Patient presented to the ED and was hemodynamically stable.  Imaging suggested pulmonary edema with possible pleural effusions.  CT showed free-flowing bilateral pleural effusions with around 500 cc of fluid and associated atelectasis.  CBC showed chronic stable anemia.  CMP showed normal electrolytes but some acidosis.  BUN is elevated but stable.  Given the volume overload image findings patient warranting admission.  TRH contacted for admission.  Review of Systems: As mentioned in the history of present illness. All other systems reviewed and are negative.   Past Medical History:  Diagnosis Date   Atrial fibrillation (HCC) 05/2016   Chronic kidney disease    Diabetes mellitus without complication (HCC)    type II   History of Helicobacter pylori infection    Hyperlipidemia    Hypertension    Microalbuminuria 2016   Obesity     Past Surgical History:  Procedure Laterality Date   AV FISTULA PLACEMENT Left 02/01/2024   Procedure: LEFT ARTERIOVENOUS (AV) FISTULA CREATION;  Surgeon: Pearline Norman RAMAN, MD;  Location: [facility] OR;  Service: Vascular;  Laterality: Left;   COLONOSCOPY     age 66yo   FISTULA  SUPERFICIALIZATION Left 04/17/2024   Procedure: LEFT ARM ARTERIOVENOUS FISTULA SUPERFICIALIZATION;  Surgeon: Pearline Norman RAMAN, MD;  Location: [facility] OR;  Service: Vascular;  Laterality: Left;   TONSILLECTOMY       reports that she has never smoked. She has never used smokeless tobacco. She reports that she does not currently use alcohol. She reports that she does not use drugs.  Allergies  Allergen Reactions   Latex Itching   Xarelto  [Rivaroxaban ]     Vaginal Bleeding    Family History  Problem Relation Age of Onset   Stroke Mother    Cancer Father        throat   Diabetes Sister    Diabetes Sister    Diabetes Sister    Cancer Sister        breast   Breast cancer Sister    Heart disease Neg Hx     Prior to Admission medications   Medication Sig Start Date End Date Taking? Authorizing Provider  allopurinol  (ZYLOPRIM ) 100 MG tablet TAKE 2 TABLETS(200 MG) BY MOUTH DAILY 03/14/24   Tysinger, Alm RAMAN, PA-C  amLODipine  (NORVASC) 10 MG tablet Take 10 mg by mouth daily. 02/23/24   [provider]  amLODipine -olmesartan  (AZOR ) 10-40 MG tablet Take 1 tablet by mouth daily. Patient not taking: Reported on 04/12/2024 02/16/23   Bulah Alm RAMAN, PA-C  apixaban  (ELIQUIS ) 5 MG TABS tablet TAKE 1 TABLET(5 MG) BY MOUTH TWICE DAILY 04/16/24   Debera Jayson MATSU, MD  atenolol  (TENORMIN )  50 MG tablet Take 1 tablet (50 mg total) by mouth 2 (two) times daily. 06/17/23   Strader, Laymon HERO, PA-C  blood glucose meter kit and supplies KIT Dispense based on patient and insurance preference. Use up to four times daily as directed. (FOR ICD-9 250.00, 250.01). 03/14/18   Tysinger, Alm RAMAN, PA-C  docusate sodium (COLACE) 100 MG capsule Take 100 mg by mouth daily.    [provider]  ferrous sulfate 325 (65 FE) MG EC tablet Take 325 mg by mouth 2 (two) times daily.    [provider]  glucose blood test strip Contour Next test strips. Use as instructed 05/10/19   Tysinger, Alm RAMAN, PA-C   hydrALAZINE  (APRESOLINE ) 50 MG tablet Take 50 mg by mouth 2 (two) times daily. 04/22/23   [provider]  Insulin  Lispro Prot & Lispro (HUMALOG  MIX 75/25 KWIKPEN) (75-25) 100 UNIT/ML Kwikpen Humalog  MIX 10 units with Breakfast and 14 units with Supper 05/31/24   Shamleffer, Ibtehal Jaralla, MD  Insulin  Pen Needle 32G X 4 MM MISC 1 Device by Does not apply route in the morning and at bedtime. 12/26/23   Shamleffer, Ibtehal Jaralla, MD  LOKELMA 10 g PACK packet Take 10 g by mouth 3 (three) times a week. 01/02/24   [provider]  Multiple Vitamin (MULTIVITAMIN WITH MINERALS) TABS tablet Take 1 tablet by mouth daily.    [provider]  oxyCODONE -acetaminophen  (PERCOCET) 5-325 MG tablet Take 1 tablet by mouth every 6 (six) hours as needed. 04/17/24   Rhyne, Samantha J, PA-C  OZEMPIC , 0.25 OR 0.5 MG/DOSE, 2 MG/3ML SOPN INJECT 0.5 MG UNDER THE SKIN ONCE WEEKLY 03/13/24   Shamleffer, Ibtehal Jaralla, MD  rosuvastatin  (CRESTOR ) 10 MG tablet Take 1 tablet (10 mg total) by mouth daily. 03/05/24 03/05/25  Tysinger, Alm RAMAN, PA-C  vitamin C (ASCORBIC ACID) 250 MG tablet Take 250 mg by mouth daily.    [provider]   Social Hx: Lives with husband and son No smoking or drinking Sees Kidney doctor in [place]. PCP is Tysinger PA and sees him every 6 months.  Independent in ADLs and iADLs.   Physical Exam: Vitals:   06/10/24 1710 06/10/24 1739 06/10/24 1740 06/10/24 1800  BP:    (!) 156/74  Pulse:  79 77 72  Resp:  (!) 26 (!) 26 (!) 27  Temp: 98.7 F (37.1 C)     TempSrc: Oral     SpO2:  95% 93% 91%  Weight:      Height:        Physical Exam: Gen: Pleasant female in no acute distress HENT: Moist mucous membranes CV: Normal rate and rhythm, JVD is present, 3+ edema present  Resp: Crackles up to mid lungs, no wheeze appreciated Abd: TTP, soft abdomen MSK: No asymmetry Neuro: Alert and oriented x 4 Psych: Normal mood and affect   Labs on Admission: I have  personally reviewed following labs and imaging studies  CBC: Recent Labs  Lab 06/10/24 1343  WBC 7.7  NEUTROABS 5.3  HGB 11.1*  HCT 35.9*  MCV 79.2*  PLT 377   Basic Metabolic Panel: Recent Labs  Lab 06/10/24 1343  NA 139  K 4.4  CL 106  CO2 16*  GLUCOSE 120*  BUN 34*  CREATININE 3.64*  CALCIUM  9.6   GFR: Estimated Creatinine Clearance: 18.1 mL/min (A) (by C-G formula based on SCr of 3.64 mg/dL (H)). Liver Function Tests: Recent Labs  Lab 06/10/24 1343  AST 24  ALT 25  ALKPHOS 111  BILITOT 0.8  PROT 7.8  ALBUMIN 3.5   No results for input(s): LIPASE, AMYLASE in the last 168 hours. No results for input(s): AMMONIA in the last 168 hours. Coagulation Profile: No results for input(s): INR, PROTIME in the last 168 hours. Cardiac Enzymes: No results for input(s): CKTOTAL, CKMB, CKMBINDEX, TROPONINI, TROPONINIHS in the last 168 hours. BNP (last 3 results) No results for input(s): BNP in the last 8760 hours. HbA1C: No results for input(s): HGBA1C in the last 72 hours. CBG: No results for input(s): GLUCAP in the last 168 hours. Lipid Profile: No results for input(s): CHOL, HDL, LDLCALC, TRIG, CHOLHDL, LDLDIRECT in the last 72 hours. Thyroid  Function Tests: No results for input(s): TSH, T4TOTAL, FREET4, T3FREE, THYROIDAB in the last 72 hours. Anemia Panel: No results for input(s): VITAMINB12, FOLATE, FERRITIN, TIBC, IRON, RETICCTPCT in the last 72 hours. Urine analysis:    Component Value Date/Time   COLORURINE YELLOW 06/10/2024 1506   APPEARANCEUR CLEAR 06/10/2024 1506   LABSPEC 1.008 06/10/2024 1506   LABSPEC 1.010 03/03/2021 0928   PHURINE 6.0 06/10/2024 1506   GLUCOSEU NEGATIVE 06/10/2024 1506   HGBUR NEGATIVE 06/10/2024 1506   BILIRUBINUR NEGATIVE 06/10/2024 1506   BILIRUBINUR negative 03/03/2021 0928   BILIRUBINUR neg 07/19/2016 1053   KETONESUR NEGATIVE 06/10/2024 1506   PROTEINUR >=300 (A)  06/10/2024 1506   UROBILINOGEN negative 07/19/2016 1053   UROBILINOGEN 0.2 10/24/2009 0056   NITRITE NEGATIVE 06/10/2024 1506   LEUKOCYTESUR TRACE (A) 06/10/2024 1506    Radiological Exams on Admission: I have personally reviewed images CT CHEST WO CONTRAST Result Date: 06/10/2024 CLINICAL DATA:  Respiratory illness, nondiagnostic x-ray, short of breath and cough EXAM: CT CHEST WITHOUT CONTRAST TECHNIQUE: Multidetector CT imaging of the chest was performed following the standard protocol without IV contrast. RADIATION DOSE REDUCTION: This exam was performed according to the departmental dose-optimization program which includes automated exposure control, adjustment of the mA and/or kV according to patient size and/or use of iterative reconstruction technique. COMPARISON:  06/02/2024 FINDINGS: Cardiovascular: Unenhanced imaging of the heart demonstrates cardiomegaly without pericardial effusion. Normal caliber of the thoracic aorta. Atherosclerosis of the aorta and coronary vasculature. Assessment of the vascular lumen cannot be performed without intravenous contrast. Mediastinum/Nodes: No enlarged mediastinal or axillary lymph nodes. Thyroid  gland, trachea, and esophagus demonstrate no significant findings. Lungs/Pleura: Small free-flowing bilateral pleural effusions, volume estimated less than 500 cc each. Dense areas of consolidation and volume loss at the lung bases, favor atelectasis over airspace disease. No pneumothorax. The central airways are patent. Upper Abdomen: No acute abnormality. Musculoskeletal: No acute or destructive bony abnormalities. Reconstructed images demonstrate no additional findings. IMPRESSION: 1. Free-flowing bilateral pleural effusions, volume estimated less than 500 cc each. 2. Dense bibasilar consolidation with associated volume loss, favor atelectasis over airspace disease. 3. Cardiomegaly without pericardial effusion. 4. Aortic Atherosclerosis (ICD10-I70.0). Coronary artery  atherosclerosis. Electronically Signed   By: Ozell Daring M.D.   On: 06/10/2024 16:32   DG Chest Port 1 View Result Date: 06/10/2024 CLINICAL DATA:  sob / cough EXAM: PORTABLE CHEST 1 VIEW COMPARISON:  March 18, 2017 FINDINGS: The cardiomediastinal silhouette is unchanged in contour. Small bilateral pleural effusions. No pneumothorax. Perihilar vascular enlargement with patchy bibasilar heterogeneous opacities. IMPRESSION: Constellation of findings are favored to reflect pulmonary edema with small bilateral pleural effusions. Differential considerations include infection or aspiration. Electronically Signed   By: Corean Salter M.D.   On: 06/10/2024 14:02    EKG: My personal interpretation of  EKG shows: shows sinus rhythm with prolonged PR interval.   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  Patient is ESRD but is not currently on HD.  She has been experiencing decrease in her urination which I believe is leading to her volume overload state.  She has not been on diuretics which we will start this admission per nephrology and her volume status does not improve she may need HD this admission.  Lasix 40 mg twice daily ordered.  Will titrate based on her response.  Will get magnesium level.  Will ensure both magnesium and potassium are at goal given the diuresis.  I do anticipate her pulmonary edema and pleural effusions to decrease with diuresis.  No concerning finding to warrant a thoracentesis at this time. If no  significant improvement despite IV diuresis then nephrology will need to be consulted to initiate HD.  Hypertension: Blood pressure is elevated but this will improve with diuresis.  Current blood pressure is 156/74.  Holding home blood pressure medications.  Microcytic anemia: Anemia appears to be stable and chronic.  Hemoglobin goals greater than 10 with her kidney disease. Will order iron studies   Gout: Continue home allopurinol .  Paroxysmal A-fib: Continue home Eliquis .  Patient in sinus rhythm  Type 2 diabetes: Patient on Ozempic  and insulin  at home.  Started on SSI.  Hyperlipidemia: Continue home statin.  VTE prophylaxis: Eliquis  Diet: NPO Access: Lines: Code Status:  Full Code HCDM: Telemetry:  Admission status: Inpatient, Telemetry bed Patient is from: Home  Anticipated d/c is to: Home  Anticipated d/c date is: 2-3 days  Family Communication: Updated at bedside   Consults called: None   Severity of Illness: The appropriate patient status for this patient is INPATIENT. Inpatient status is judged to be reasonable and necessary in order to provide the required intensity of service to ensure the patient's safety. The patient's presenting symptoms, physical exam findings, and initial radiographic and laboratory data in the context of their chronic comorbidities is felt to place them at high risk for further clinical deterioration. Furthermore, it is not anticipated that the patient will be medically stable for discharge from the hospital within 2 midnights of admission.   * I certify that at the point of admission it is my clinical judgment that the patient will require inpatient hospital care spanning beyond 2 midnights from the point of admission due to high intensity of service, high risk for further deterioration and high frequency of surveillance required.DEWAINE Morene Bathe, MD Jol[place] DEL. [facility]

## 2024-06-11 ENCOUNTER — Inpatient Hospital Stay: Admitting: Medical

## 2024-06-11 DIAGNOSIS — E1169 Type 2 diabetes mellitus with other specified complication: Secondary | ICD-10-CM

## 2024-06-11 DIAGNOSIS — J810 Acute pulmonary edema: Secondary | ICD-10-CM | POA: Diagnosis not present

## 2024-06-11 DIAGNOSIS — I10 Essential (primary) hypertension: Secondary | ICD-10-CM

## 2024-06-11 DIAGNOSIS — I480 Paroxysmal atrial fibrillation: Secondary | ICD-10-CM | POA: Diagnosis not present

## 2024-06-11 DIAGNOSIS — E785 Hyperlipidemia, unspecified: Secondary | ICD-10-CM

## 2024-06-11 LAB — RENAL FUNCTION PANEL
Albumin: 2.9 g/dL — ABNORMAL LOW (ref 3.5–5.0)
Anion gap: 12 (ref 5–15)
BUN: 33 mg/dL — ABNORMAL HIGH (ref 8–23)
CO2: 18 mmol/L — ABNORMAL LOW (ref 22–32)
Calcium: 9.2 mg/dL (ref 8.9–10.3)
Chloride: 108 mmol/L (ref 98–111)
Creatinine, Ser: 3.53 mg/dL — ABNORMAL HIGH (ref 0.44–1.00)
GFR, Estimated: 14 mL/min — ABNORMAL LOW (ref >60)
Glucose, Bld: 94 mg/dL (ref 70–99)
Phosphorus: 4.1 mg/dL (ref 2.5–4.6)
Potassium: 4.3 mmol/L (ref 3.5–5.1)
Sodium: 138 mmol/L (ref 135–145)

## 2024-06-11 LAB — HEMOGLOBIN A1C
Hgb A1c MFr Bld: 5.5 % (ref 4.8–5.6)
Mean Plasma Glucose: 111.15 mg/dL

## 2024-06-11 LAB — RETICULOCYTES
Immature Retic Fract: 12.8 % (ref 2.3–15.9)
RBC.: 4.09 MIL/uL (ref 3.87–5.11)
Retic Count, Absolute: 74 K/uL (ref 19.0–186.0)
Retic Ct Pct: 1.8 % (ref 0.4–3.1)

## 2024-06-11 LAB — CBG MONITORING, ED
Glucose-Capillary: 125 mg/dL — ABNORMAL HIGH (ref 70–99)
Glucose-Capillary: 104 mg/dL — ABNORMAL HIGH (ref 70–99)
Glucose-Capillary: 90 mg/dL (ref 70–99)

## 2024-06-11 LAB — CBC
HCT: 32 % — ABNORMAL LOW (ref 36.0–46.0)
Hemoglobin: 10.1 g/dL — ABNORMAL LOW (ref 12.0–15.0)
MCH: 24.5 pg — ABNORMAL LOW (ref 26.0–34.0)
MCHC: 31.6 g/dL (ref 30.0–36.0)
MCV: 77.5 fL — ABNORMAL LOW (ref 80.0–100.0)
Platelets: 354 K/uL (ref 150–400)
RBC: 4.13 MIL/uL (ref 3.87–5.11)
RDW: 20.5 % — ABNORMAL HIGH (ref 11.5–15.5)
WBC: 6.7 K/uL (ref 4.0–10.5)
nRBC: 0 % (ref 0.0–0.2)

## 2024-06-11 LAB — GLUCOSE, CAPILLARY
Glucose-Capillary: 123 mg/dL — ABNORMAL HIGH (ref 70–99)
Glucose-Capillary: 78 mg/dL (ref 70–99)

## 2024-06-11 LAB — FOLATE: Folate: 19 ng/mL (ref >5.9)

## 2024-06-11 LAB — BRAIN NATRIURETIC PEPTIDE: B Natriuretic Peptide: 2117.6 pg/mL — ABNORMAL HIGH (ref 0.0–100.0)

## 2024-06-11 LAB — IRON AND TIBC
Iron: 27 ug/dL — ABNORMAL LOW (ref 28–170)
Saturation Ratios: 12 % (ref 10.4–31.8)
TIBC: 220 ug/dL — ABNORMAL LOW (ref 250–450)
UIBC: 193 ug/dL

## 2024-06-11 LAB — VITAMIN B12: Vitamin B-12: 1751 pg/mL — ABNORMAL HIGH (ref 180–914)

## 2024-06-11 LAB — FERRITIN: Ferritin: 273 ng/mL (ref 11–307)

## 2024-06-11 MED ORDER — ALLOPURINOL 100 MG PO TABS
100.0000 mg | ORAL_TABLET | Freq: Every day | ORAL | Status: DC
  Administered 2024-06-11 – 2024-06-13 (×3): 100 mg via ORAL
  Filled 2024-06-11 (×3): qty 1

## 2024-06-11 MED ORDER — ROSUVASTATIN CALCIUM 5 MG PO TABS
10.0000 mg | ORAL_TABLET | Freq: Every day | ORAL | Status: DC
  Administered 2024-06-11 – 2024-06-13 (×3): 10 mg via ORAL
  Filled 2024-06-11 (×3): qty 2

## 2024-06-11 MED ORDER — INSULIN ASPART 100 UNIT/ML IJ SOLN
3.0000 [IU] | Freq: Three times a day (TID) | INTRAMUSCULAR | Status: DC
  Administered 2024-06-11 (×2): 3 [IU] via SUBCUTANEOUS

## 2024-06-11 MED ORDER — INSULIN ASPART 100 UNIT/ML IJ SOLN: 0.0000 [IU] | Freq: Every day | INTRAMUSCULAR | Status: DC

## 2024-06-11 MED ORDER — INSULIN ASPART 100 UNIT/ML IJ SOLN: 0.0000 [IU] | Freq: Three times a day (TID) | INTRAMUSCULAR | Status: DC

## 2024-06-11 MED ORDER — SORBITOL 70 % SOLN
30.0000 mL | Freq: Once | Status: AC
  Administered 2024-06-11: 30 mL via ORAL
  Filled 2024-06-11: qty 30

## 2024-06-11 MED ORDER — POLYETHYLENE GLYCOL 3350 17 G PO PACK
17.0000 g | PACK | Freq: Two times a day (BID) | ORAL | Status: AC
  Administered 2024-06-11 – 2024-06-12 (×3): 17 g via ORAL
  Filled 2024-06-11 (×4): qty 1

## 2024-06-11 MED ORDER — HYDRALAZINE HCL 50 MG PO TABS
50.0000 mg | ORAL_TABLET | Freq: Two times a day (BID) | ORAL | Status: DC
  Administered 2024-06-11 – 2024-06-13 (×5): 50 mg via ORAL
  Filled 2024-06-11 (×5): qty 1

## 2024-06-11 MED ORDER — INFLUENZA VAC SPLIT HIGH-DOSE 0.5 ML IM SUSY
0.5000 mL | PREFILLED_SYRINGE | INTRAMUSCULAR | Status: AC
  Administered 2024-06-13: 0.5 mL via INTRAMUSCULAR
  Filled 2024-06-11: qty 0.5

## 2024-06-11 MED ORDER — APIXABAN 5 MG PO TABS
5.0000 mg | ORAL_TABLET | Freq: Two times a day (BID) | ORAL | Status: DC
  Administered 2024-06-11 – 2024-06-13 (×6): 5 mg via ORAL
  Filled 2024-06-11 (×6): qty 1

## 2024-06-11 MED ORDER — ATENOLOL 50 MG PO TABS
50.0000 mg | ORAL_TABLET | Freq: Two times a day (BID) | ORAL | Status: DC
  Administered 2024-06-11 – 2024-06-13 (×5): 50 mg via ORAL
  Filled 2024-06-11 (×5): qty 1

## 2024-06-11 MED ORDER — INSULIN ASPART 100 UNIT/ML IJ SOLN
0.0000 [IU] | Freq: Three times a day (TID) | INTRAMUSCULAR | Status: DC
  Administered 2024-06-11: 1 [IU] via SUBCUTANEOUS

## 2024-06-11 MED ORDER — FUROSEMIDE 10 MG/ML IJ SOLN
80.0000 mg | Freq: Two times a day (BID) | INTRAMUSCULAR | Status: AC
Start: 2024-06-11 — End: 2024-06-13
  Administered 2024-06-11 – 2024-06-12 (×4): 80 mg via INTRAVENOUS
  Filled 2024-06-11 (×4): qty 8

## 2024-06-11 NOTE — Progress Notes (Signed)
 [facility] PROGRESS NOTE    Progress Note  Kathy [name]  FMW:[id number] DOB: 1956/06/16 DOA: 06/10/2024 PCP: Bulah Alm RAMAN, PA-C     Brief Narrative:   Kathy [name] is an 68 y.o. female past medical history significant for essential hypertension, hyperlipidemia, diabetes mellitus type 2, chronic kidney disease stage IV (with a baseline creatinine around 3.5) comes into the ED complaining of dyspnea and cough.  Blood pressure significantly elevated, she was satting greater than 88% on room air, remains afebrile CT of the chest showed bilateral pleural effusions, cardiomegaly and bibasilar consolidations.   Assessment/Plan:   Acute pulmonary edema in the setting of chronic kidney disease stage IV: Will go ahead and increase her Lasix.  Her last 2D echo showed an EF of 55% no regional wall motion abnormality undetermined diastolic parameters. Monitor strict I's and O's and daily weights. Monitor and replete electrolytes as needed She relates her insurance will not pay for a diuretic, she does have a mature fistula she said it was done about 6 weeks ago.  Essential hypertension: Resume atenolol  hold diuretic therapy continue IV Lasix.  Microcytic anemia/anemia of chronic renal disease: Hemoglobin appears to be relatively stable continue to monitor.  PAF (paroxysmal atrial fibrillation) (HCC) Continue Eliquis  resume metoprolol  he remains in sinus rhythm.  Hyperlipidemia associated with type 2 diabetes mellitus (HCC) Continue statins.  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 Check an A1c. Hold Ozempic  and home regimen.   DVT prophylaxis: lovenox Family Communication:none Status is: Inpatient Remains inpatient appropriate because: Acute pulmonary edema    Code Status:     Code Status Orders  (From admission, onward)            Start     Ordered   06/10/24 1854  Full code  Continuous       Question:  By:  Answer:  Consent: discussion documented in EHR   06/10/24 1856           Code Status History     This patient has a current code status but no historical code status.         IV Access:   Peripheral IV   Procedures and diagnostic studies:   CT CHEST WO CONTRAST Result Date: 06/10/2024 CLINICAL DATA:  Respiratory illness, nondiagnostic x-ray, short of breath and cough EXAM: CT CHEST WITHOUT CONTRAST TECHNIQUE: Multidetector CT imaging of the chest was performed following the standard protocol without IV contrast. RADIATION DOSE REDUCTION: This exam was performed according to the departmental dose-optimization program which includes automated exposure control, adjustment of the mA and/or kV according to patient size and/or use of iterative reconstruction technique. COMPARISON:  06/02/2024 FINDINGS: Cardiovascular: Unenhanced imaging of the heart demonstrates cardiomegaly without pericardial effusion. Normal caliber of the thoracic aorta. Atherosclerosis of the aorta and coronary vasculature. Assessment of the vascular lumen cannot be performed without intravenous contrast. Mediastinum/Nodes: No enlarged mediastinal or axillary lymph nodes. Thyroid  gland, trachea, and esophagus demonstrate no significant findings. Lungs/Pleura: Small free-flowing bilateral pleural effusions, volume estimated less than 500 cc each. Dense areas of consolidation and volume loss at the lung bases, favor atelectasis over airspace disease. No pneumothorax. The central airways are patent. Upper Abdomen: No acute abnormality. Musculoskeletal: No acute or destructive bony abnormalities. Reconstructed images demonstrate no additional findings. IMPRESSION: 1. Free-flowing bilateral pleural effusions, volume estimated less than 500 cc each. 2. Dense bibasilar consolidation with associated volume loss,  favor atelectasis over airspace disease.  3. Cardiomegaly without pericardial effusion. 4. Aortic Atherosclerosis (ICD10-I70.0). Coronary artery atherosclerosis. Electronically Signed   By: Ozell Daring M.D.   On: 06/10/2024 16:32   DG Chest Port 1 View Result Date: 06/10/2024 CLINICAL DATA:  sob / cough EXAM: PORTABLE CHEST 1 VIEW COMPARISON:  March 18, 2017 FINDINGS: The cardiomediastinal silhouette is unchanged in contour. Small bilateral pleural effusions. No pneumothorax. Perihilar vascular enlargement with patchy bibasilar heterogeneous opacities. IMPRESSION: Constellation of findings are favored to reflect pulmonary edema with small bilateral pleural effusions. Differential considerations include infection or aspiration. Electronically Signed   By: Corean Salter M.D.   On: 06/10/2024 14:02     Medical Consultants:   None.   Subjective:    Kathy [name] she relates her breathing is now better.  Objective:    Vitals:   06/11/24 0515 06/11/24 0530 06/11/24 0545 06/11/24 0600  BP: (!) 159/89 (!) 147/75 (!) 156/80 (!) 143/80  Pulse:    90  Resp: (!) 27 (!) 26 (!) 26 (!) 22  Temp:    98 F (36.7 C)  TempSrc:    Oral  SpO2:    94%  Weight:      Height:       SpO2: 94 %  No intake or output data in the 24 hours ending 06/11/24 0604 Filed Weights   06/10/24 1319  Weight: 97.5 kg    Exam: General exam: In no acute distress. Respiratory system: Good air movement and crackles at bases Cardiovascular system: S1 & S2 heard, RRR. SABRA  Gastrointestinal system: Abdomen is nondistended, soft and nontender.  Extremities: No pedal edema. Skin: No rashes, lesions or ulcers Psychiatry: Judgement and insight appear normal. Mood & affect appropriate.    Data Reviewed:    Labs: Basic Metabolic Panel: Recent Labs  Lab 06/10/24 1343  NA 139  K 4.4  CL 106  CO2 16*  GLUCOSE 120*  BUN 34*  CREATININE 3.64*  CALCIUM  9.6   GFR Estimated Creatinine Clearance: 18.1 mL/min (A) (by C-G formula based on SCr of  3.64 mg/dL (H)). Liver Function Tests: Recent Labs  Lab 06/10/24 1343  AST 24  ALT 25  ALKPHOS 111  BILITOT 0.8  PROT 7.8  ALBUMIN 3.5   No results for input(s): LIPASE, AMYLASE in the last 168 hours. No results for input(s): AMMONIA in the last 168 hours. Coagulation profile No results for input(s): INR, PROTIME in the last 168 hours. COVID-19 Labs  No results for input(s): DDIMER, FERRITIN, LDH, CRP in the last 72 hours.  Lab Results  Component Value Date   SARSCOV2NAA NEGATIVE 06/10/2024    CBC: Recent Labs  Lab 06/10/24 1343  WBC 7.7  NEUTROABS 5.3  HGB 11.1*  HCT 35.9*  MCV 79.2*  PLT 377   Cardiac Enzymes: No results for input(s): CKTOTAL, CKMB, CKMBINDEX, TROPONINI in the last 168 hours. BNP (last 3 results) No results for input(s): PROBNP in the last 8760 hours. CBG: Recent Labs  Lab 06/11/24 0109  GLUCAP 125*   D-Dimer: No results for input(s): DDIMER in the last 72 hours. Hgb A1c: No results for input(s): HGBA1C in the last 72 hours. Lipid Profile: No results for input(s): CHOL, HDL, LDLCALC, TRIG, CHOLHDL, LDLDIRECT in the last 72 hours. Thyroid  function studies: No results for input(s): TSH, T4TOTAL, T3FREE, THYROIDAB in the last 72 hours.  Invalid input(s): FREET3 Anemia work up: No results for input(s): VITAMINB12, FOLATE, FERRITIN, TIBC, IRON, RETICCTPCT in the last  72 hours. Sepsis Labs: Recent Labs  Lab 06/10/24 1343  WBC 7.7   Microbiology Recent Results (from the past 240 hours)  Resp panel by RT-PCR (RSV, Flu A&B, Covid) Anterior Nasal Swab     Status: None   Collection Time: 06/10/24  1:43 PM   Specimen: Anterior Nasal Swab  Result Value Ref Range Status   SARS Coronavirus 2 by RT PCR NEGATIVE NEGATIVE Final   Influenza A by PCR NEGATIVE NEGATIVE Final   Influenza B by PCR NEGATIVE NEGATIVE Final    Comment: (NOTE) The Xpert Xpress SARS-CoV-2/FLU/RSV plus  assay is intended as an aid in the diagnosis of influenza from Nasopharyngeal swab specimens and should not be used as a sole basis for treatment. Nasal washings and aspirates are unacceptable for Xpert Xpress SARS-CoV-2/FLU/RSV testing.  Fact Sheet for Patients: BloggerCourse.com  Fact Sheet for Healthcare Providers: SeriousBroker.it  This test is not yet approved or cleared by the [place]  FDA and has been authorized for detection and/or diagnosis of SARS-CoV-2 by FDA under an Emergency Use Authorization (EUA). This EUA will remain in effect (meaning this test can be used) for the duration of the COVID-19 declaration under Section 564(b)(1) of the Act, 21 U.S.C. section 360bbb-3(b)(1), unless the authorization is terminated or revoked.     Resp Syncytial Virus by PCR NEGATIVE NEGATIVE Final    Comment: (NOTE) Fact Sheet for Patients: BloggerCourse.com  Fact Sheet for Healthcare Providers: SeriousBroker.it  This test is not yet approved or cleared by the [place]  FDA and has been authorized for detection and/or diagnosis of SARS-CoV-2 by FDA under an Emergency Use Authorization (EUA). This EUA will remain in effect (meaning this test can be used) for the duration of the COVID-19 declaration under Section 564(b)(1) of the Act, 21 U.S.C. section 360bbb-3(b)(1), unless the authorization is terminated or revoked.  Performed at [facility], [address] [place],  [address]      Medications:    allopurinol   100 mg Oral Daily   apixaban   5 mg Oral BID   furosemide  40 mg Intravenous BID   insulin  aspart  0-5 Units Subcutaneous QHS   insulin  aspart  0-9 Units Subcutaneous TID WC   rosuvastatin   10 mg Oral Daily   Continuous Infusions:    LOS: 1 day   Erle Odell Castor  [facility]  06/11/2024, 6:04 AM

## 2024-06-12 ENCOUNTER — Telehealth (HOSPITAL_COMMUNITY): Payer: Self-pay | Admitting: Pharmacy Technician

## 2024-06-12 ENCOUNTER — Other Ambulatory Visit (HOSPITAL_COMMUNITY): Payer: Self-pay

## 2024-06-12 DIAGNOSIS — E1169 Type 2 diabetes mellitus with other specified complication: Secondary | ICD-10-CM | POA: Diagnosis not present

## 2024-06-12 DIAGNOSIS — I10 Essential (primary) hypertension: Secondary | ICD-10-CM | POA: Diagnosis not present

## 2024-06-12 DIAGNOSIS — I480 Paroxysmal atrial fibrillation: Secondary | ICD-10-CM | POA: Diagnosis not present

## 2024-06-12 DIAGNOSIS — J810 Acute pulmonary edema: Secondary | ICD-10-CM | POA: Diagnosis not present

## 2024-06-12 LAB — IRON AND TIBC
Iron: 28 ug/dL (ref 28–170)
Saturation Ratios: 12 % (ref 10.4–31.8)
TIBC: 227 ug/dL — ABNORMAL LOW (ref 250–450)
UIBC: 199 ug/dL

## 2024-06-12 LAB — BASIC METABOLIC PANEL WITH GFR
Anion gap: 12 (ref 5–15)
BUN: 30 mg/dL — ABNORMAL HIGH (ref 8–23)
CO2: 19 mmol/L — ABNORMAL LOW (ref 22–32)
Calcium: 9.1 mg/dL (ref 8.9–10.3)
Chloride: 104 mmol/L (ref 98–111)
Creatinine, Ser: 3.51 mg/dL — ABNORMAL HIGH (ref 0.44–1.00)
GFR, Estimated: 14 mL/min — ABNORMAL LOW (ref >60)
Glucose, Bld: 125 mg/dL — ABNORMAL HIGH (ref 70–99)
Potassium: 3.9 mmol/L (ref 3.5–5.1)
Sodium: 135 mmol/L (ref 135–145)

## 2024-06-12 LAB — RETICULOCYTES
Immature Retic Fract: 15.3 % (ref 2.3–15.9)
RBC.: 3.97 MIL/uL (ref 3.87–5.11)
Retic Count, Absolute: 64.3 K/uL (ref 19.0–186.0)
Retic Ct Pct: 1.6 % (ref 0.4–3.1)

## 2024-06-12 LAB — GLUCOSE, CAPILLARY
Glucose-Capillary: 73 mg/dL (ref 70–99)
Glucose-Capillary: 128 mg/dL — ABNORMAL HIGH (ref 70–99)
Glucose-Capillary: 137 mg/dL — ABNORMAL HIGH (ref 70–99)

## 2024-06-12 LAB — HIV ANTIBODY (ROUTINE TESTING W REFLEX): HIV Screen 4th Generation wRfx: NONREACTIVE

## 2024-06-12 LAB — FERRITIN: Ferritin: 268 ng/mL (ref 11–307)

## 2024-06-12 LAB — VITAMIN B12: Vitamin B-12: 1858 pg/mL — ABNORMAL HIGH (ref 180–914)

## 2024-06-12 LAB — FOLATE: Folate: >20 ng/mL (ref >5.9)

## 2024-06-12 MED ORDER — SODIUM BICARBONATE 650 MG PO TABS
650.0000 mg | ORAL_TABLET | Freq: Two times a day (BID) | ORAL | Status: DC
  Administered 2024-06-12 – 2024-06-13 (×3): 650 mg via ORAL
  Filled 2024-06-12 (×3): qty 1

## 2024-06-12 NOTE — Evaluation (Signed)
 Physical Therapy Evaluation Patient Details Name: Kathy [name] MRN: [id number] DOB: [birth year] Today's Date: 06/12/2024  History of Present Illness  Kathy [name] is an 68 y.o. female came to ED complaining of dyspnea and cough. Blood pressure significantly elevated, CT of the chest showed bilateral pleural effusions, cardiomegaly and bibasilar consolidations.  PMH: essential hypertension, hyperlipidemia, diabetes mellitus type 2, chronic kidney disease stage IV   Clinical Impression  Pt admitted with above. PTA pt living with spouse and functioning indep without AD however reports sedentary lifestyle due to onset of SOB. Pt currently presenting with generalized deconditioning, onset of SOB with mobility, impaired balance, and increased falls risk. Pt demonstrated significantly improved stability, fluidity of gait, and decreased SOB when ambulating with rollator however pt reluctant to use one stating It does make it easier but I think I can do it on my own. Pt educated on benefits of using rollator to improve activity tolerance safely as pt will always have a place to sit and it is a way to aide in energy conservation to improve activity tolerance. Pt with good home set up and support. Recommend use of rollator for community ambulation. Acute PT to cont to follow while in hospital however I do not feel patient will need follow up PT upon d/c.      If plan is discharge home, recommend the following: A little help with walking and/or transfers;A little help with bathing/dressing/bathroom;Assist for transportation;Help with stairs or ramp for entrance   Can travel by private vehicle        Equipment Recommendations Rollator (4 wheels) (aware patient reluctant)  Recommendations for Other Services       Functional Status Assessment Patient has had a recent decline in their functional status and demonstrates the ability to make significant improvements in function in a reasonable and  predictable amount of time.     Precautions / Restrictions Precautions Precautions: Fall Recall of Precautions/Restrictions: Intact Restrictions Weight Bearing Restrictions Per Provider Order: No      Mobility  Bed Mobility Overal bed mobility: Modified Independent             General bed mobility comments: with HOB elevated pt able to transfer in/out of bed without assist    Transfers Overall transfer level: Needs assistance Equipment used: None Transfers: Sit to/from Stand Sit to Stand: Contact guard assist           General transfer comment: verbal cues for hand placement, increased time    Ambulation/Gait Ambulation/Gait assistance: Contact guard assist, Min assist Gait Distance (Feet): 100 Feet (x1, 75x1) Assistive device: None, Rollator (4 wheels) Gait Pattern/deviations: Step-through pattern, Decreased stride length, Narrow base of support, Staggering right, Staggering left Gait velocity: improved with rollator     General Gait Details: pt initially amb without AD however pt unsteady requiring up to minA to prevent fall. Pt with near cross over gait at times, pt ultimately required HHA last 25' due to fatigue and instability. Pt given rollator to trial, pt with signifciant improvement in stability, fluidity of gait, increased cadence, and noted to be less SOB. SPO2 on RA upon return from amb without AD was 89% , SpO2 on RA upon returning from amb with rollator 94%  Stairs            Wheelchair Mobility     Tilt Bed    Modified Rankin (Stroke Patients Only)       Balance Overall balance assessment: Needs assistance Sitting-balance support: Feet supported, No  upper extremity supported Sitting balance-Leahy Scale: Good     Standing balance support: No upper extremity supported Standing balance-Leahy Scale: Fair Standing balance comment: wide base of support to perform pericare s/p tolieting                              Pertinent Vitals/Pain Pain Assessment Pain Assessment: No/denies pain    Home Living Family/patient expects to be discharged to:: Private residence Living Arrangements: Spouse/significant other Available Help at Discharge: Family;Available 24 hours/day Type of Home: House Home Access: Stairs to enter Entrance Stairs-Rails: Can reach both Entrance Stairs-Number of Steps: 4   Home Layout: One level Home Equipment: None      Prior Function Prior Level of Function : Independent/Modified Independent             Mobility Comments: doesn't use AD, doesn't drive ADLs Comments: indep     Extremity/Trunk Assessment   Upper Extremity Assessment Upper Extremity Assessment: Generalized weakness    Lower Extremity Assessment Lower Extremity Assessment: Generalized weakness    Cervical / Trunk Assessment Cervical / Trunk Assessment: Normal  Communication   Communication Communication: No apparent difficulties    Cognition Arousal: Alert Behavior During Therapy: WFL for tasks assessed/performed   PT - Cognitive impairments: No apparent impairments                       PT - Cognition Comments: pt reluctant to use rollator despite it making mobility easier Following commands: Intact       Cueing Cueing Techniques: Verbal cues     General Comments General comments (skin integrity, edema, etc.): VSS    Exercises     Assessment/Plan    PT Assessment Patient needs continued PT services  PT Problem List Decreased strength;Decreased range of motion;Decreased activity tolerance;Decreased balance;Decreased mobility;Decreased knowledge of use of DME;Decreased safety awareness;Cardiopulmonary status limiting activity       PT Treatment Interventions DME instruction;Gait training;Stair training;Functional mobility training;Therapeutic activities;Therapeutic exercise;Balance training    PT Goals (Current goals can be found in the Care Plan section)  Acute  Rehab PT Goals Patient Stated Goal: home PT Goal Formulation: With patient Time For Goal Achievement: 06/26/24 Potential to Achieve Goals: Good    Frequency Min 3X/week     Co-evaluation               AM-PAC PT 6 Clicks Mobility  Outcome Measure Help needed turning from your back to your side while in a flat bed without using bedrails?: None Help needed moving from lying on your back to sitting on the side of a flat bed without using bedrails?: None Help needed moving to and from a bed to a chair (including a wheelchair)?: A Little Help needed standing up from a chair using your arms (e.g., wheelchair or bedside chair)?: A Little Help needed to walk in hospital room?: A Lot Help needed climbing 3-5 steps with a railing? : A Lot 6 Click Score: 18    End of Session Equipment Utilized During Treatment: Gait belt Activity Tolerance: Patient limited by fatigue Patient left: in bed;with call bell/phone within reach;with bed alarm set Nurse Communication: Mobility status PT Visit Diagnosis: Unsteadiness on feet (R26.81);Muscle weakness (generalized) (M62.81);Difficulty in walking, not elsewhere classified (R26.2)    Time: 8845-8782 PT Time Calculation (min) (ACUTE ONLY): 23 min   Charges:   PT Evaluation $PT Eval Low Complexity: 1 Low PT Treatments $Gait Training:  8-22 mins PT General Charges $$ ACUTE PT VISIT: 1 Visit         Norene Ames, PT, DPT Acute Rehabilitation Services Secure chat preferred Office #: [phone]   Norene CHRISTELLA Ames 06/12/2024, 2:04 PM

## 2024-06-12 NOTE — Plan of Care (Signed)

## 2024-06-12 NOTE — Telephone Encounter (Signed)
 Patient Product/process development scientist completed.    The patient is insured through [facility]. Patient has Medicare and is not eligible for a copay card, but may be able to apply for patient assistance or Medicare RX Payment Plan (Patient Must reach out to their plan, if eligible for payment plan), if available.    Ran test claim for Furosemide 80 mg and the current 30 day co-pay is $0.00.   This test claim was processed through [facility]- copay amounts may vary at other pharmacies due to pharmacy/plan contracts, or as the patient moves through the different stages of their insurance plan.     Reyes Sharps, CPHT Pharmacy Technician III Certified Patient Advocate [facility] Patient Advocate Team Direct Number: [phone]  Fax: [phone]

## 2024-06-12 NOTE — Progress Notes (Signed)
 [facility] PROGRESS NOTE    Progress Note  Kathy [name]  FMW:[id number] DOB: Apr 20, 1956 DOA: 06/10/2024 PCP: Bulah Alm RAMAN, PA-C     Brief Narrative:   Kathy [name] is an 68 y.o. female past medical history significant for essential hypertension, hyperlipidemia, diabetes mellitus type 2, chronic kidney disease stage IV (with a baseline creatinine around 3.5) comes into the ED complaining of dyspnea and cough.  Blood pressure significantly elevated, she was satting greater than 88% on room air, remains afebrile CT of the chest showed bilateral pleural effusions, cardiomegaly and bibasilar consolidations.   Assessment/Plan:   Acute pulmonary edema likely due to acute decompensated diastolic dysfunction in the setting of chronic kidney disease stage IV: Continue IV Lasix 80 mg twice a day. Her last 2D echo showed an EF of 55% no regional wall motion abnormality undetermined diastolic parameters. Negative about 2-1/2 L. Continue to monitor and replete electrolytes. Basic metabolic panels pending this morning. Monitor and replete electrolytes as needed. Started on bicarbonate tablets.  Essential hypertension: Continue atenolol   and IV Lasix. Blood pressures improving.  Continue current regimen.  Microcytic anemia/anemia of chronic renal disease: Hemoglobin appears to be relatively stable continue to monitor. Check an anemia panel. FOBT her stools. She does not remember when was her last colonoscopy.  PAF (paroxysmal atrial fibrillation) (HCC) Continue Eliquis  and metoprolol  he remains in sinus rhythm.  Hyperlipidemia associated with type 2 diabetes mellitus (HCC) Continue statins.  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   DVT prophylaxis: lovenox Family Communication:none Status is: Inpatient Remains inpatient appropriate because: Acute pulmonary edema    Code Status:      Code Status Orders  (From admission, onward)           Start     Ordered   06/10/24 1854  Full code  Continuous       Question:  By:  Answer:  Consent: discussion documented in EHR   06/10/24 1856           Code Status History     This patient has a current code status but no historical code status.         IV Access:   Peripheral IV   Procedures and diagnostic studies:   CT CHEST WO CONTRAST Result Date: 06/10/2024 CLINICAL DATA:  Respiratory illness, nondiagnostic x-ray, short of breath and cough EXAM: CT CHEST WITHOUT CONTRAST TECHNIQUE: Multidetector CT imaging of the chest was performed following the standard protocol without IV contrast. RADIATION DOSE REDUCTION: This exam was performed according to the departmental dose-optimization program which includes automated exposure control, adjustment of the mA and/or kV according to patient size and/or use of iterative reconstruction technique. COMPARISON:  06/02/2024 FINDINGS: Cardiovascular: Unenhanced imaging of the heart demonstrates cardiomegaly without pericardial effusion. Normal caliber of the thoracic aorta. Atherosclerosis of the aorta and coronary vasculature. Assessment of the vascular lumen cannot be performed without intravenous contrast. Mediastinum/Nodes: No enlarged mediastinal or axillary lymph nodes. Thyroid  gland, trachea, and esophagus demonstrate no significant findings. Lungs/Pleura: Small free-flowing bilateral pleural effusions, volume estimated less than 500 cc each. Dense areas of consolidation and volume loss at the lung bases, favor atelectasis over airspace disease. No pneumothorax. The central airways are patent. Upper Abdomen: No acute abnormality. Musculoskeletal: No acute or destructive bony abnormalities. Reconstructed images demonstrate no additional findings. IMPRESSION: 1. Free-flowing bilateral pleural effusions, volume estimated less than 500 cc each. 2. Dense bibasilar consolidation  with  associated volume loss, favor atelectasis over airspace disease. 3. Cardiomegaly without pericardial effusion. 4. Aortic Atherosclerosis (ICD10-I70.0). Coronary artery atherosclerosis. Electronically Signed   By: Ozell Daring M.D.   On: 06/10/2024 16:32   DG Chest Port 1 View Result Date: 06/10/2024 CLINICAL DATA:  sob / cough EXAM: PORTABLE CHEST 1 VIEW COMPARISON:  March 18, 2017 FINDINGS: The cardiomediastinal silhouette is unchanged in contour. Small bilateral pleural effusions. No pneumothorax. Perihilar vascular enlargement with patchy bibasilar heterogeneous opacities. IMPRESSION: Constellation of findings are favored to reflect pulmonary edema with small bilateral pleural effusions. Differential considerations include infection or aspiration. Electronically Signed   By: Corean Salter M.D.   On: 06/10/2024 14:02     Medical Consultants:   None.   Subjective:    Kathy [name] she relates her breathing is now better.  Objective:    Vitals:   06/11/24 2147 06/12/24 0625 06/12/24 0630 06/12/24 0735  BP: 139/63  (!) 158/68 (!) 142/61  Pulse: 78 78 76 76  Resp:    18  Temp:   97.8 F (36.6 C) 98 F (36.7 C)  TempSrc:   Oral Oral  SpO2:  92% 95% 94%  Weight:      Height:       SpO2: 94 %   Intake/Output Summary (Last 24 hours) at 06/12/2024 0840 Last data filed at 06/12/2024 0600 Gross per 24 hour  Intake 840 ml  Output 2700 ml  Net -1860 ml   Filed Weights   06/10/24 1319 06/11/24 1301  Weight: 97.5 kg (P) 103.8 kg    Exam: General exam: In no acute distress. Respiratory system: Lungs are clear good air movement. Cardiovascular system: S1 & S2 heard, RRR. No JVD. Gastrointestinal system: Abdomen is nondistended, soft and nontender.  Extremities: No pedal edema. Skin: No rashes, lesions or ulcers Psychiatry: Judgement and insight appear normal. Mood & affect appropriate.  Data Reviewed:    Labs: Basic Metabolic Panel: Recent Labs  Lab  06/10/24 1343 06/11/24 0602  NA 139 138  K 4.4 4.3  CL 106 108  CO2 16* 18*  GLUCOSE 120* 94  BUN 34* 33*  CREATININE 3.64* 3.53*  CALCIUM  9.6 9.2  PHOS  --  4.1   GFR Estimated Creatinine Clearance: 18.6 mL/min (A) (by C-G formula based on SCr of 3.53 mg/dL (H)). Liver Function Tests: Recent Labs  Lab 06/10/24 1343 06/11/24 0602  AST 24  --   ALT 25  --   ALKPHOS 111  --   BILITOT 0.8  --   PROT 7.8  --   ALBUMIN 3.5 2.9*   No results for input(s): LIPASE, AMYLASE in the last 168 hours. No results for input(s): AMMONIA in the last 168 hours. Coagulation profile No results for input(s): INR, PROTIME in the last 168 hours. COVID-19 Labs  Recent Labs    06/11/24 0602  FERRITIN 273    Lab Results  Component Value Date   SARSCOV2NAA NEGATIVE 06/10/2024    CBC: Recent Labs  Lab 06/10/24 1343 06/11/24 0602  WBC 7.7 6.7  NEUTROABS 5.3  --   HGB 11.1* 10.1*  HCT 35.9* 32.0*  MCV 79.2* 77.5*  PLT 377 354   Cardiac Enzymes: No results for input(s): CKTOTAL, CKMB, CKMBINDEX, TROPONINI in the last 168 hours. BNP (last 3 results) No results for input(s): PROBNP in the last 8760 hours. CBG: Recent Labs  Lab 06/11/24 0725 06/11/24 1214 06/11/24 1626 06/11/24 2117 06/12/24 0613  GLUCAP 104* 90 123* 78 73  D-Dimer: No results for input(s): DDIMER in the last 72 hours. Hgb A1c: Recent Labs    06/11/24 0602  HGBA1C 5.5   Lipid Profile: No results for input(s): CHOL, HDL, LDLCALC, TRIG, CHOLHDL, LDLDIRECT in the last 72 hours. Thyroid  function studies: No results for input(s): TSH, T4TOTAL, T3FREE, THYROIDAB in the last 72 hours.  Invalid input(s): FREET3 Anemia work up: Recent Labs    06/11/24 0602  VITAMINB12 1,751*  FOLATE 19.0  FERRITIN 273  TIBC 220*  IRON 27*  RETICCTPCT 1.8   Sepsis Labs: Recent Labs  Lab 06/10/24 1343 06/11/24 0602  WBC 7.7 6.7   Microbiology Recent Results (from  the past 240 hours)  Resp panel by RT-PCR (RSV, Flu A&B, Covid) Anterior Nasal Swab     Status: None   Collection Time: 06/10/24  1:43 PM   Specimen: Anterior Nasal Swab  Result Value Ref Range Status   SARS Coronavirus 2 by RT PCR NEGATIVE NEGATIVE Final   Influenza A by PCR NEGATIVE NEGATIVE Final   Influenza B by PCR NEGATIVE NEGATIVE Final    Comment: (NOTE) The Xpert Xpress SARS-CoV-2/FLU/RSV plus assay is intended as an aid in the diagnosis of influenza from Nasopharyngeal swab specimens and should not be used as a sole basis for treatment. Nasal washings and aspirates are unacceptable for Xpert Xpress SARS-CoV-2/FLU/RSV testing.  Fact Sheet for Patients: BloggerCourse.com  Fact Sheet for Healthcare Providers: SeriousBroker.it  This test is not yet approved or cleared by the [place]  FDA and has been authorized for detection and/or diagnosis of SARS-CoV-2 by FDA under an Emergency Use Authorization (EUA). This EUA will remain in effect (meaning this test can be used) for the duration of the COVID-19 declaration under Section 564(b)(1) of the Act, 21 U.S.C. section 360bbb-3(b)(1), unless the authorization is terminated or revoked.     Resp Syncytial Virus by PCR NEGATIVE NEGATIVE Final    Comment: (NOTE) Fact Sheet for Patients: BloggerCourse.com  Fact Sheet for Healthcare Providers: SeriousBroker.it  This test is not yet approved or cleared by the [place]  FDA and has been authorized for detection and/or diagnosis of SARS-CoV-2 by FDA under an Emergency Use Authorization (EUA). This EUA will remain in effect (meaning this test can be used) for the duration of the COVID-19 declaration under Section 564(b)(1) of the Act, 21 U.S.C. section 360bbb-3(b)(1), unless the authorization is terminated or revoked.  Performed at [facility], [address]  [place], [place] [address]      Medications:    allopurinol   100 mg Oral Daily   apixaban   5 mg Oral BID   atenolol   50 mg Oral BID   furosemide  80 mg Intravenous BID   hydrALAZINE   50 mg Oral BID   Influenza vac split trivalent PF  0.5 mL Intramuscular Tomorrow-1000   insulin  aspart  0-5 Units Subcutaneous QHS   insulin  aspart  0-9 Units Subcutaneous TID WC   insulin  aspart  3 Units Subcutaneous TID WC   polyethylene glycol  17 g Oral BID   rosuvastatin   10 mg Oral Daily   Continuous Infusions:    LOS: 2 days   Kathy [name]  [facility]  06/12/2024, 8:40 AM

## 2024-06-12 NOTE — TOC CM/SW Note (Signed)
 Transition of Care [facility]) - Inpatient Brief Assessment   Patient Details  Name: Kathy [name] MRN: [id number] Date of Birth: 06/11/1956  Transition of Care [facility]) CM/SW Contact:    Waddell Barnie Rama, RN Phone Number: 06/12/2024, 4:20 PM   Clinical Narrative: From home with spouse, has PCP and insurance on file, states has no HH services in place at this time or DME at home.  States family member  (spouse) will transport them home at [employer] and family is support system, states gets medications from [facility] on [address].  Pta self ambulatory.   There are no ICM needs identified  at this time.  Please place consult for any ICM needs.     Transition of Care Asessment: Insurance and Status: Insurance coverage has been reviewed Patient has primary care physician: Yes Home environment has been reviewed: home with spouse Prior level of function:: indep Prior/Current Home Services: No current home services Social Drivers of Health Review: SDOH reviewed no interventions necessary Readmission risk has been reviewed: Yes Transition of care needs: no transition of care needs at this time

## 2024-06-13 DIAGNOSIS — R0600 Dyspnea, unspecified: Secondary | ICD-10-CM | POA: Diagnosis not present

## 2024-06-13 LAB — BASIC METABOLIC PANEL WITH GFR
Anion gap: 11 (ref 5–15)
BUN: 32 mg/dL — ABNORMAL HIGH (ref 8–23)
CO2: 21 mmol/L — ABNORMAL LOW (ref 22–32)
Calcium: 9.1 mg/dL (ref 8.9–10.3)
Chloride: 105 mmol/L (ref 98–111)
Creatinine, Ser: 3.54 mg/dL — ABNORMAL HIGH (ref 0.44–1.00)
GFR, Estimated: 13 mL/min — ABNORMAL LOW (ref >60)
Glucose, Bld: 105 mg/dL — ABNORMAL HIGH (ref 70–99)
Potassium: 3.9 mmol/L (ref 3.5–5.1)
Sodium: 137 mmol/L (ref 135–145)

## 2024-06-13 MED ORDER — ACETAMINOPHEN 325 MG PO TABS: 650.0000 mg | ORAL_TABLET | Freq: Four times a day (QID) | ORAL | Status: AC | PRN

## 2024-06-13 MED ORDER — TORSEMIDE 100 MG PO TABS: 50.0000 mg | ORAL_TABLET | Freq: Every day | ORAL | 2 refills | Status: AC

## 2024-06-13 MED ORDER — FERROUS SULFATE 325 (65 FE) MG PO TBEC: 1.0000 | DELAYED_RELEASE_TABLET | Freq: Every day | ORAL | Status: AC

## 2024-06-13 NOTE — TOC Transition Note (Signed)
 Transition of Care [facility]) - Discharge Note   Patient Details  Name: Kathy [name] MRN: [id number] Date of Birth: 04-May-1956  Transition of Care [facility]) CM/SW Contact:  Waddell Barnie Rama, RN Phone Number: 06/13/2024, 10:05 AM   Clinical Narrative:    For dc today, she has transportation at dc, has no needs.         Patient Goals and CMS Choice            Discharge Placement                       Discharge Plan and Services Additional resources added to the After Visit Summary for                                       Social Drivers of Health (SDOH) Interventions SDOH Screenings   Food Insecurity: No Food Insecurity (06/10/2024)  Housing: Low Risk  (06/10/2024)  Transportation Needs: No Transportation Needs (06/10/2024)  Utilities: Not At Risk (06/10/2024)  Depression (PHQ2-9): Low Risk  (03/05/2024)  Financial Resource Strain: Low Risk  (02/08/2023)  Physical Activity: Insufficiently Active (02/08/2023)  Social Connections: Socially Isolated (06/10/2024)  Stress: No Stress Concern Present (02/08/2023)  Tobacco Use: Low Risk  (06/10/2024)     Readmission Risk Interventions    06/12/2024    4:19 PM  Readmission Risk Prevention Plan  Medication Screening Complete  Transportation Screening Complete

## 2024-06-13 NOTE — Progress Notes (Signed)
 Discharge instructions (including medications) discussed with and copy provided to patient and husband. Patient and husband given the opportunity to ask questions. Questions clarified.

## 2024-06-13 NOTE — Care Management Important Message (Signed)
 Important Message  Patient Details  Name: Kathy [name] MRN: [id number] Date of Birth: [birth year]   Important Message Given:  Yes - Medicare IM     Vonzell Arrie Sharps 06/13/2024, 11:05 AM

## 2024-06-13 NOTE — Plan of Care (Signed)

## 2024-06-13 NOTE — Plan of Care (Signed)
  Problem: Education: Goal: Ability to describe self-care measures that may prevent or decrease complications (Diabetes Survival Skills Education) will improve Outcome: Adequate for Discharge Goal: Individualized Educational Video(s) Outcome: Adequate for Discharge   Problem: Coping: Goal: Ability to adjust to condition or change in health will improve Outcome: Adequate for Discharge   Problem: Fluid Volume: Goal: Ability to maintain a balanced intake and output will improve Outcome: Adequate for Discharge   Problem: Health Behavior/Discharge Planning: Goal: Ability to identify and utilize available resources and services will improve Outcome: Adequate for Discharge Goal: Ability to manage health-related needs will improve Outcome: Adequate for Discharge   Problem: Metabolic: Goal: Ability to maintain appropriate glucose levels will improve Outcome: Adequate for Discharge   Problem: Nutritional: Goal: Maintenance of adequate nutrition will improve Outcome: Adequate for Discharge Goal: Progress toward achieving an optimal weight will improve Outcome: Adequate for Discharge   Problem: Skin Integrity: Goal: Risk for impaired skin integrity will decrease Outcome: Adequate for Discharge   Problem: Tissue Perfusion: Goal: Adequacy of tissue perfusion will improve Outcome: Adequate for Discharge   Problem: Education: Goal: Knowledge of General Education information will improve Description: Including pain rating scale, medication(s)/side effects and non-pharmacologic comfort measures Outcome: Adequate for Discharge   Problem: Health Behavior/Discharge Planning: Goal: Ability to manage health-related needs will improve Outcome: Adequate for Discharge   Problem: Clinical Measurements: Goal: Ability to maintain clinical measurements within normal limits will improve Outcome: Adequate for Discharge Goal: Will remain free from infection Outcome: Adequate for Discharge Goal:  Diagnostic test results will improve Outcome: Adequate for Discharge Goal: Respiratory complications will improve Outcome: Adequate for Discharge Goal: Cardiovascular complication will be avoided Outcome: Adequate for Discharge   Problem: Activity: Goal: Risk for activity intolerance will decrease Outcome: Adequate for Discharge   Problem: Nutrition: Goal: Adequate nutrition will be maintained Outcome: Adequate for Discharge   Problem: Coping: Goal: Level of anxiety will decrease Outcome: Adequate for Discharge   Problem: Elimination: Goal: Will not experience complications related to bowel motility Outcome: Adequate for Discharge Goal: Will not experience complications related to urinary retention Outcome: Adequate for Discharge   Problem: Pain Managment: Goal: General experience of comfort will improve and/or be controlled Outcome: Adequate for Discharge   Problem: Safety: Goal: Ability to remain free from injury will improve Outcome: Adequate for Discharge   Problem: Skin Integrity: Goal: Risk for impaired skin integrity will decrease Outcome: Adequate for Discharge   Problem: Acute Rehab PT Goals(only PT should resolve) Goal: Patient Will Transfer Sit To/From Stand Outcome: Adequate for Discharge Goal: Pt Will Transfer Bed To Chair/Chair To Bed Outcome: Adequate for Discharge Goal: Pt Will Ambulate Outcome: Adequate for Discharge Goal: Pt Will Go Up/Down Stairs Outcome: Adequate for Discharge

## 2024-06-14 ENCOUNTER — Telehealth: Payer: Self-pay

## 2024-06-14 ENCOUNTER — Inpatient Hospital Stay: Admitting: Medical

## 2024-06-14 NOTE — Patient Instructions (Signed)
 Visit Information  Thank you for taking time to visit with me today. Please don't hesitate to contact me if I can be of assistance to you   Patient instructions: Take your medication as prescribed Your follow up appointment with your primary provider is 06/19/24 at 11am Monitor your weight and blood pressure daily and record Report new or ongoing symptoms to provider Notify provider for 2-3 lbs weight gain overnight or 5 lbs in a week, increase in shortness of breath, swelling in ;your feet/ ankles/ legs   Patient verbalizes understanding of instructions and care plan provided today and agrees to view in MyChart. Active MyChart status and patient understanding of how to access instructions and care plan via MyChart confirmed with patient.     The patient has been provided with contact information for the care management team and has been advised to call with any health related questions or concerns.   Please call the care guide team at [phone] if you need to cancel or reschedule your appointment.   Please call the Suicide and Crisis Lifeline: 988 call the [place]  National Suicide Prevention Lifeline: 574-050-9884 or TTY: 862-208-2887 TTY 574-072-6908) to talk to a trained counselor call 1-800-273-TALK (toll free, 24 hour hotline) if you are experiencing a Mental Health or Behavioral Health Crisis or need someone to talk to.  Arvin Seip RN, BSN, CCM [employer], Population Health Case Manager Phone: [phone]

## 2024-06-14 NOTE — Transitions of Care (Post Inpatient/ED Visit) (Signed)
 06/14/2024  Name: Kathy [name] MRN: [id number] DOB: [birth year]  Today's TOC FU Call Status: Today's TOC FU Call Status:: Successful TOC FU Call Completed TOC FU Call Complete Date: 06/14/24 Patient's Name and Date of Birth confirmed.  Transition Care Management Follow-up Telephone Call Date of Discharge: 06/13/24 Discharge Facility: [facility]) Type of Discharge: Inpatient Admission Primary Inpatient Discharge Diagnosis:: acute pulmonary edema How have you been since you were released from the hospital?: Better Any questions or concerns?: No  Items Reviewed: Did you receive and understand the discharge instructions provided?: Yes Medications obtained,verified, and reconciled?: Yes (Medications Reviewed) Any new allergies since your discharge?: No Dietary orders reviewed?: Yes Type of Diet Ordered:: low carbohydrate Do you have support at home?: Yes People in Home [RPT]: spouse Name of Support/Comfort Primary Source: Corinthia Bjork  Medications Reviewed Today: Medications Reviewed Today     Reviewed by Dakari Cregger E, RN (Registered Nurse) on 06/14/24 at 1440  Med List Status: <None>   Medication Order Taking? Sig Documenting Provider Last Dose Status Informant  acetaminophen  (TYLENOL ) 325 MG tablet [id number] Yes Take 2 tablets (650 mg total) by mouth every 6 (six) hours as needed for mild pain (pain score 1-3) or fever (or Fever >/= 101). Will Almarie MATSU, MD  Active   allopurinol  (ZYLOPRIM ) 100 MG tablet [id number] Yes TAKE 2 TABLETS(200 MG) BY MOUTH DAILY Tysinger, Alm RAMAN, PA-C  Active Self, Pharmacy Records  apixaban  (ELIQUIS ) 5 MG TABS tablet [id number] Yes TAKE 1 TABLET(5 MG) BY MOUTH TWICE DAILY Debera Jayson MATSU, MD  Active Self, Pharmacy Records  atenolol  (TENORMIN ) 50 MG tablet [id number] Yes Take 1 tablet (50 mg total) by mouth 2 (two) times daily. Johnson Laymon HERO, PA-C  Active Self, Pharmacy Records  blood glucose meter kit and supplies KIT [id number]  Yes Dispense based on patient and insurance preference. Use up to four times daily as directed. (FOR ICD-9 250.00, 250.01). Tysinger, Alm RAMAN, PA-C  Active Self, Pharmacy Records  ferrous sulfate 325 (65 FE) MG EC tablet [id number] Yes Take 1 tablet (325 mg total) by mouth daily. Will Almarie MATSU, MD  Active   glucose blood test strip [id number] Yes Contour Next test strips. Use as instructed Tysinger, Alm RAMAN, PA-C  Active Self, Pharmacy Records  hydrALAZINE  (APRESOLINE ) 50 MG tablet [id number] Yes Take 50 mg by mouth 2 (two) times daily. [provider]  Active Self, Pharmacy Records  Insulin  Lispro Prot & Lispro (HUMALOG  MIX 75/25 KWIKPEN) (75-25) 100 UNIT/ML Kary [id number] Yes Humalog  MIX 10 units with Breakfast and 14 units with Supper  Patient taking differently: Inject 5-7 Units into the skin See admin instructions. Humalog  MIX  inject 5 units into the skin with Breakfast and 7 units with Supper   Shamleffer, Ibtehal Jaralla, MD  Active Self, Pharmacy Records  Insulin  Pen Needle 32G X 4 MM MISC [id number] Yes 1 Device by Does not apply route in the morning and at bedtime. Shamleffer, Donell Cardinal, MD  Active Self, Pharmacy Records  LOKELMA 10 g PACK packet [id number] Yes Take 10 g by mouth 3 (three) times a week. [provider]  Active Self, Pharmacy Records  Multiple Vitamin (MULTIVITAMIN WITH MINERALS) TABS tablet [id number] Yes Take 1 tablet by mouth daily. [provider]  Active Self, Pharmacy Records  OZEMPIC , 0.25 OR 0.5 MG/DOSE, 2 MG/3ML SOPN [id number] Yes INJECT 0.5 MG UNDER THE SKIN ONCE WEEKLY Shamleffer, Donell Cardinal, MD  Active Self, Pharmacy Records  psyllium (HYDROCIL/METAMUCIL) 95 % PACK [id number]  Yes Take 1 packet by mouth daily. [provider]  Active Self  rosuvastatin  (CRESTOR ) 10 MG tablet [id number] Yes Take 1 tablet (10 mg total) by mouth daily. Tysinger, Alm RAMAN, PA-C  Active Self, Pharmacy Records  torsemide [facility]) 100 MG  tablet [id number] Yes Take 0.5 tablets (50 mg total) by mouth daily. Will Almarie MATSU, MD  Active   vitamin C (ASCORBIC ACID) 250 MG tablet [id number] Yes Take 250 mg by mouth daily. [provider]  Active Self, Pharmacy Records            Home Care and Equipment/Supplies: Were Home Health Services Ordered?: No Any new equipment or medical supplies ordered?: No  Functional Questionnaire: Do you need assistance with bathing/showering or dressing?: No Do you need assistance with meal preparation?: No Do you need assistance with eating?: No Do you have difficulty maintaining continence: No Do you need assistance with getting out of bed/getting out of a chair/moving?: No Do you have difficulty managing or taking your medications?: No  Follow up appointments reviewed: PCP Follow-up appointment confirmed?: Yes Date of PCP follow-up appointment?: 06/19/24 Follow-up Provider: Dr. Alm Gent Specialist [facility] Follow-up appointment confirmed?: Yes Date of Specialist follow-up appointment?: 06/26/24 Follow-Up Specialty Provider:: Dr. Sam Do you need transportation to your follow-up appointment?: No Do you understand care options if your condition(s) worsen?: Yes-patient verbalized understanding  SDOH Interventions Today    Flowsheet Row Most Recent Value  SDOH Interventions   Food Insecurity Interventions Intervention Not Indicated  Housing Interventions Intervention Not Indicated  Transportation Interventions Intervention Not Indicated  Utilities Interventions Intervention Not Indicated   Discussed and offered 30 day TOC program.  Patient declined.  The patient has been provided with contact information for the care management team and has been advised to call with any health -related questions or concerns.  The patient verbalized understanding with current plan of care.  The patient is directed to their insurance card regarding availability of benefits  coverage.    Arvin Seip RN, BSN, CCM [employer], Population Health Case Manager Phone: [phone]

## 2024-06-14 NOTE — Transitions of Care (Post Inpatient/ED Visit) (Signed)
   06/14/2024  Name: Kathy [name] MRN: [id number] DOB: 07-04-1956  Today's TOC FU Call Status: Today's TOC FU Call Status:: Unsuccessful Call (1st Attempt) Unsuccessful Call (1st Attempt) Date: 06/14/24  Attempted to reach the patient regarding the most recent Inpatient/ED visit.  Follow Up Plan: Additional outreach attempts will be made to reach the patient to complete the Transitions of Care (Post Inpatient/ED visit) call.   Arvin Seip RN, BSN, CCM [employer], Population Health Case Manager Phone: [phone]

## 2024-06-15 DIAGNOSIS — R0600 Dyspnea, unspecified: Secondary | ICD-10-CM

## 2024-06-15 NOTE — Discharge Summary (Signed)
 Physician Discharge Summary  Kathy [name] FMW:[id number] DOB: 1956/08/18 DOA: 06/10/2024  PCP: Kathy Alm RAMAN, PA-C  Admit date: 06/10/2024 Discharge date: 06/13/2024 Admitted From: Home Disposition: Home Recommendations for Outpatient Follow-up:  Follow up with PCP in 1-2 weeks Please obtain BMP/CBC in one week Please follow up with cardiology  Home Health: None  Equipment/Devices none   Discharge Condition: Stable CODE STATUS: Full code Diet recommendation: Renal cardiac diet Brief/Interim Summary:   68 y.o. female past medical history significant for essential hypertension, hyperlipidemia, diabetes mellitus type 2, chronic kidney disease stage IV (with a baseline creatinine around 3.5) comes into the ED complaining of dyspnea and cough.  Blood pressure significantly elevated, she was satting greater than 88% on room air, remains afebrile CT of the chest showed bilateral pleural effusions, cardiomegaly and bibasilar consolidations.   Discharge Diagnoses:  Principal Problem:   Pulmonary edema Active Problems:   Essential hypertension, benign   PAF (paroxysmal atrial fibrillation) (HCC)   Hyperlipidemia associated with type 2 diabetes mellitus (HCC)   Poorly controlled type II diabetes mellitus with renal complication (HCC)   Type 2 diabetes mellitus with stage 5 chronic kidney disease not on chronic dialysis, with long-term current use of insulin  (HCC)  Acute pulmonary edema likely due to acute decompensated diastolic dysfunction in the setting of chronic kidney disease stage IV: She was treated with Lasix 80 mg twice a day and discharged on Demadex. She was ambulating in the room and hallway without oxygen and maintained her saturation. Her last 2D echo showed an EF of 55% no regional wall motion abnormality undetermined diastolic parameters. Negative about 2-1/2 L. Her creatinine was 3.54 on discharge.  She and her husband is aware that they need to follow-up with  nephrologist.  Essential hypertension: Continue atenolol    Microcytic anemia/anemia of chronic renal disease: StaBLE  PAF (paroxysmal atrial fibrillation) (HCC) Continue Eliquis  and metoprolol  he remains in sinus rhythm.   Hyperlipidemia associated with type 2 diabetes mellitus (HCC) Continue statins.   Poorly controlled type II diabetes mellitus with renal complication (HCC) Continue home medication  Estimated body mass index is 34.79 kg/m (pended) as calculated from the following:   Height as of this encounter: (P) 5' 8 (1.727 m).   Weight as of this encounter: (P) 103.8 kg.  Discharge Instructions  Discharge Instructions     Diet - low sodium heart healthy   Complete by: As directed    Increase activity slowly   Complete by: As directed       Allergies as of 06/13/2024       Reactions   Lactose Intolerance (gi)    Bloating and flatulence   Latex Itching   Xarelto  [rivaroxaban ]    Vaginal Bleeding        Medication List     STOP taking these medications    amLODipine  10 MG tablet Commonly known as: NORVASC       TAKE these medications    acetaminophen  325 MG tablet Commonly known as: TYLENOL  Take 2 tablets (650 mg total) by mouth every 6 (six) hours as needed for mild pain (pain score 1-3) or fever (or Fever >/= 101).   allopurinol  100 MG tablet Commonly known as: ZYLOPRIM  TAKE 2 TABLETS(200 MG) BY MOUTH [name]   atenolol  50 MG tablet Commonly known as: TENORMIN  Take 1 tablet (50 mg total) by mouth 2 (two) times [name].   blood glucose meter kit and supplies Kit Dispense based on patient and insurance preference. Use up to  four times [name] as directed. (FOR ICD-9 250.00, 250.01).   Eliquis  5 MG Tabs tablet Generic drug: apixaban  TAKE 1 TABLET(5 MG) BY MOUTH TWICE [name]   ferrous sulfate 325 (65 FE) MG EC tablet Take 1 tablet (325 mg total) by mouth [name]. What changed: how much to take   glucose blood test strip Contour Next test  strips. Use as instructed   hydrALAZINE  50 MG tablet Commonly known as: APRESOLINE  Take 50 mg by mouth 2 (two) times [name].   Insulin  Lispro Prot & Lispro (75-25) 100 UNIT/ML Kwikpen Commonly known as: HumaLOG  Mix 75/25 KwikPen Humalog  MIX 10 units with Breakfast and 14 units with Supper What changed:  how much to take how to take this when to take this additional instructions   Insulin  Pen Needle 32G X 4 MM Misc 1 Device by Does not apply route in the morning and at bedtime.   Lokelma 10 g Pack packet Generic drug: sodium zirconium cyclosilicate Take 10 g by mouth 3 (three) times a week.   multivitamin with minerals Tabs tablet Take 1 tablet by mouth [name].   Ozempic  (0.25 or 0.5 MG/DOSE) 2 MG/3ML Sopn Generic drug: Semaglutide (0.25 or 0.5MG /DOS) INJECT 0.5 MG UNDER THE SKIN ONCE WEEKLY   psyllium 95 % Pack Commonly known as: HYDROCIL/METAMUCIL Take 1 packet by mouth [name].   rosuvastatin  10 MG tablet Commonly known as: Crestor  Take 1 tablet (10 mg total) by mouth [name].   torsemide 100 MG tablet Commonly known as: DEMADEX Take 0.5 tablets (50 mg total) by mouth [name].   vitamin C 250 MG tablet Commonly known as: ASCORBIC ACID Take 250 mg by mouth [name].        Follow-up Information     Kathy Alm RAMAN, PA-C Follow up on 06/19/2024.   Specialty: Family Medicine Why: check bmp in 1 week 11am for hospital follow up Contact information: [address] [place] [address] [phone]         Dennise Hoes, MD Follow up.   Specialty: Nephrology Contact information: [address] [place] [address] [phone]                Allergies  Allergen Reactions   Lactose Intolerance (Gi)     Bloating and flatulence   Latex Itching   Xarelto  [Rivaroxaban ]     Vaginal Bleeding    Consultations:none   Procedures/Studies: CT CHEST WO CONTRAST Result Date: 06/10/2024 CLINICAL DATA:  Respiratory illness, nondiagnostic x-ray, short of  breath and cough EXAM: CT CHEST WITHOUT CONTRAST TECHNIQUE: Multidetector CT imaging of the chest was performed following the standard protocol without IV contrast. RADIATION DOSE REDUCTION: This exam was performed according to the departmental dose-optimization program which includes automated exposure control, adjustment of the mA and/or kV according to patient size and/or use of iterative reconstruction technique. COMPARISON:  06/02/2024 FINDINGS: Cardiovascular: Unenhanced imaging of the heart demonstrates cardiomegaly without pericardial effusion. Normal caliber of the thoracic aorta. Atherosclerosis of the aorta and coronary vasculature. Assessment of the vascular lumen cannot be performed without intravenous contrast. Mediastinum/Nodes: No enlarged mediastinal or axillary lymph nodes. Thyroid  gland, trachea, and esophagus demonstrate no significant findings. Lungs/Pleura: Small free-flowing bilateral pleural effusions, volume estimated less than 500 cc each. Dense areas of consolidation and volume loss at the lung bases, favor atelectasis over airspace disease. No pneumothorax. The central airways are patent. Upper Abdomen: No acute abnormality. Musculoskeletal: No acute or destructive bony abnormalities. Reconstructed images demonstrate no additional findings. IMPRESSION: 1. Free-flowing bilateral pleural effusions, volume  estimated less than 500 cc each. 2. Dense bibasilar consolidation with associated volume loss, favor atelectasis over airspace disease. 3. Cardiomegaly without pericardial effusion. 4. Aortic Atherosclerosis (ICD10-I70.0). Coronary artery atherosclerosis. Electronically Signed   By: Ozell Daring M.D.   On: 06/10/2024 16:32   DG Chest Port 1 View Result Date: 06/10/2024 CLINICAL DATA:  sob / cough EXAM: PORTABLE CHEST 1 VIEW COMPARISON:  March 18, 2017 FINDINGS: The cardiomediastinal silhouette is unchanged in contour. Small bilateral pleural effusions. No pneumothorax. Perihilar  vascular enlargement with patchy bibasilar heterogeneous opacities. IMPRESSION: Constellation of findings are favored to reflect pulmonary edema with small bilateral pleural effusions. Differential considerations include infection or aspiration. Electronically Signed   By: Corean Salter M.D.   On: 06/10/2024 14:02   (Echo, Carotid, EGD, Colonoscopy, ERCP)    Subjective:  Ambulating inside the room husband at bedside anxious to go home Discharge Exam: Vitals:   06/13/24 0357 06/13/24 0745  BP: 135/77 (!) 152/60  Pulse: 81 70  Resp: 19 18  Temp: 98.2 F (36.8 C) 97.9 F (36.6 C)  SpO2: 94% 96%   Vitals:   06/12/24 1926 06/13/24 0011 06/13/24 0357 06/13/24 0745  BP: (!) 140/61 (!) 155/65 135/77 (!) 152/60  Pulse: 72 69 81 70  Resp: 19 20 19 18   Temp: 98.1 F (36.7 C) 98.2 F (36.8 C) 98.2 F (36.8 C) 97.9 F (36.6 C)  TempSrc: Oral Oral Oral Oral  SpO2: 98% 95% 94% 96%  Weight:      Height:        General: Pt is alert, awake, not in acute distress Cardiovascular: RRR, S1/S2 +, no rubs, no gallops Respiratory: CTA bilaterally, no wheezing, no rhonchi Abdominal: Soft, NT, ND, bowel sounds + Extremities: no edema, no cyanosis    The results of significant diagnostics from this hospitalization (including imaging, microbiology, ancillary and laboratory) are listed below for reference.     Microbiology: Recent Results (from the past 240 hours)  Resp panel by RT-PCR (RSV, Flu A&B, Covid) Anterior Nasal Swab     Status: None   Collection Time: 06/10/24  1:43 PM   Specimen: Anterior Nasal Swab  Result Value Ref Range Status   SARS Coronavirus 2 by RT PCR NEGATIVE NEGATIVE Final   Influenza A by PCR NEGATIVE NEGATIVE Final   Influenza B by PCR NEGATIVE NEGATIVE Final    Comment: (NOTE) The Xpert Xpress SARS-CoV-2/FLU/RSV plus assay is intended as an aid in the diagnosis of influenza from Nasopharyngeal swab specimens and should not be used as a sole basis for  treatment. Nasal washings and aspirates are unacceptable for Xpert Xpress SARS-CoV-2/FLU/RSV testing.  Fact Sheet for Patients: BloggerCourse.com  Fact Sheet for Healthcare Providers: SeriousBroker.it  This test is not yet approved or cleared by the [place]  FDA and has been authorized for detection and/or diagnosis of SARS-CoV-2 by FDA under an Emergency Use Authorization (EUA). This EUA will remain in effect (meaning this test can be used) for the duration of the COVID-19 declaration under Section 564(b)(1) of the Act, 21 U.S.C. section 360bbb-3(b)(1), unless the authorization is terminated or revoked.     Resp Syncytial Virus by PCR NEGATIVE NEGATIVE Final    Comment: (NOTE) Fact Sheet for Patients: BloggerCourse.com  Fact Sheet for Healthcare Providers: SeriousBroker.it  This test is not yet approved or cleared by the [place]  FDA and has been authorized for detection and/or diagnosis of SARS-CoV-2 by FDA under an Emergency Use Authorization (EUA). This EUA will remain in effect (  meaning this test can be used) for the duration of the COVID-19 declaration under Section 564(b)(1) of the Act, 21 U.S.C. section 360bbb-3(b)(1), unless the authorization is terminated or revoked.  Performed at [facility], [address] [place], [place] [address]      Labs: BNP (last 3 results) Recent Labs    06/11/24 0602  BNP 2,117.6*   Basic Metabolic Panel: Recent Labs  Lab 06/10/24 1343 06/11/24 0602 06/12/24 0858 06/13/24 0321  NA 139 138 135 137  K 4.4 4.3 3.9 3.9  CL 106 108 104 105  CO2 16* 18* 19* 21*  GLUCOSE 120* 94 125* 105*  BUN 34* 33* 30* 32*  CREATININE 3.64* 3.53* 3.51* 3.54*  CALCIUM  9.6 9.2 9.1 9.1  PHOS  --  4.1  --   --    Liver Function Tests: Recent Labs  Lab 06/10/24 1343 06/11/24 0602  AST 24  --   ALT 25  --   ALKPHOS 111   --   BILITOT 0.8  --   PROT 7.8  --   ALBUMIN 3.5 2.9*   No results for input(s): LIPASE, AMYLASE in the last 168 hours. No results for input(s): AMMONIA in the last 168 hours. CBC: Recent Labs  Lab 06/10/24 1343 06/11/24 0602  WBC 7.7 6.7  NEUTROABS 5.3  --   HGB 11.1* 10.1*  HCT 35.9* 32.0*  MCV 79.2* 77.5*  PLT 377 354   Cardiac Enzymes: No results for input(s): CKTOTAL, CKMB, CKMBINDEX, TROPONINI in the last 168 hours. BNP: Invalid input(s): POCBNP CBG: Recent Labs  Lab 06/11/24 1626 06/11/24 2117 06/12/24 0613 06/12/24 1116 06/12/24 1619  GLUCAP 123* 78 73 128* 137*   D-Dimer No results for input(s): DDIMER in the last 72 hours. Hgb A1c No results for input(s): HGBA1C in the last 72 hours. Lipid Profile No results for input(s): CHOL, HDL, LDLCALC, TRIG, CHOLHDL, LDLDIRECT in the last 72 hours. Thyroid  function studies No results for input(s): TSH, T4TOTAL, T3FREE, THYROIDAB in the last 72 hours.  Invalid input(s): FREET3 Anemia work up No results for input(s): VITAMINB12, FOLATE, FERRITIN, TIBC, IRON, RETICCTPCT in the last 72 hours. Urinalysis    Component Value Date/Time   COLORURINE YELLOW 06/10/2024 1506   APPEARANCEUR CLEAR 06/10/2024 1506   LABSPEC 1.008 06/10/2024 1506   LABSPEC 1.010 03/03/2021 0928   PHURINE 6.0 06/10/2024 1506   GLUCOSEU NEGATIVE 06/10/2024 1506   HGBUR NEGATIVE 06/10/2024 1506   BILIRUBINUR NEGATIVE 06/10/2024 1506   BILIRUBINUR negative 03/03/2021 0928   BILIRUBINUR neg 07/19/2016 1053   KETONESUR NEGATIVE 06/10/2024 1506   PROTEINUR >=300 (A) 06/10/2024 1506   UROBILINOGEN negative 07/19/2016 1053   UROBILINOGEN 0.2 10/24/2009 0056   NITRITE NEGATIVE 06/10/2024 1506   LEUKOCYTESUR TRACE (A) 06/10/2024 1506   Sepsis Labs Recent Labs  Lab 06/10/24 1343 06/11/24 0602  WBC 7.7 6.7   Microbiology Recent Results (from the past 240 hours)  Resp panel by RT-PCR  (RSV, Flu A&B, Covid) Anterior Nasal Swab     Status: None   Collection Time: 06/10/24  1:43 PM   Specimen: Anterior Nasal Swab  Result Value Ref Range Status   SARS Coronavirus 2 by RT PCR NEGATIVE NEGATIVE Final   Influenza A by PCR NEGATIVE NEGATIVE Final   Influenza B by PCR NEGATIVE NEGATIVE Final    Comment: (NOTE) The Xpert Xpress SARS-CoV-2/FLU/RSV plus assay is intended as an aid in the diagnosis of influenza from Nasopharyngeal swab specimens and should not be used as  a sole basis for treatment. Nasal washings and aspirates are unacceptable for Xpert Xpress SARS-CoV-2/FLU/RSV testing.  Fact Sheet for Patients: BloggerCourse.com  Fact Sheet for Healthcare Providers: SeriousBroker.it  This test is not yet approved or cleared by the [place]  FDA and has been authorized for detection and/or diagnosis of SARS-CoV-2 by FDA under an Emergency Use Authorization (EUA). This EUA will remain in effect (meaning this test can be used) for the duration of the COVID-19 declaration under Section 564(b)(1) of the Act, 21 U.S.C. section 360bbb-3(b)(1), unless the authorization is terminated or revoked.     Resp Syncytial Virus by PCR NEGATIVE NEGATIVE Final    Comment: (NOTE) Fact Sheet for Patients: BloggerCourse.com  Fact Sheet for Healthcare Providers: SeriousBroker.it  This test is not yet approved or cleared by the [place]  FDA and has been authorized for detection and/or diagnosis of SARS-CoV-2 by FDA under an Emergency Use Authorization (EUA). This EUA will remain in effect (meaning this test can be used) for the duration of the COVID-19 declaration under Section 564(b)(1) of the Act, 21 U.S.C. section 360bbb-3(b)(1), unless the authorization is terminated or revoked.  Performed at [facility], [address], [place], [place] [address]      Time  coordinating discharge: 48 MIN  SIGNED:   Almarie KANDICE Hoots, MD  [facility] 06/15/2024, 6:16 PM

## 2024-06-19 ENCOUNTER — Ambulatory Visit (INDEPENDENT_AMBULATORY_CARE_PROVIDER_SITE_OTHER): Admitting: Medical

## 2024-06-19 VITALS — BP 110/70 | HR 56 | Wt 207.8 lb

## 2024-06-19 DIAGNOSIS — I10 Essential (primary) hypertension: Secondary | ICD-10-CM | POA: Diagnosis not present

## 2024-06-19 DIAGNOSIS — I480 Paroxysmal atrial fibrillation: Secondary | ICD-10-CM | POA: Diagnosis not present

## 2024-06-19 DIAGNOSIS — N185 Chronic kidney disease, stage 5: Secondary | ICD-10-CM | POA: Diagnosis not present

## 2024-06-19 DIAGNOSIS — Z794 Long term (current) use of insulin: Secondary | ICD-10-CM

## 2024-06-19 DIAGNOSIS — J810 Acute pulmonary edema: Secondary | ICD-10-CM | POA: Diagnosis not present

## 2024-06-19 DIAGNOSIS — Z9289 Personal history of other medical treatment: Secondary | ICD-10-CM | POA: Diagnosis not present

## 2024-06-19 DIAGNOSIS — Z23 Encounter for immunization: Secondary | ICD-10-CM | POA: Diagnosis not present

## 2024-06-19 DIAGNOSIS — R0600 Dyspnea, unspecified: Secondary | ICD-10-CM | POA: Diagnosis not present

## 2024-06-19 DIAGNOSIS — E1122 Type 2 diabetes mellitus with diabetic chronic kidney disease: Secondary | ICD-10-CM

## 2024-06-19 NOTE — Patient Instructions (Signed)
 Recommendations Hold the Torsemide fluid pill today and Friday Otherwise continue the Torsemide fluid pill and follow up with the kidney doctor next week Weight yourself daily, write the weights down, and take these readings to kidney doctor next week

## 2024-06-19 NOTE — Addendum Note (Signed)
 Addended by: VICCI HUSBAND A on: 06/19/2024 12:08 PM   Modules accepted: Orders

## 2024-06-19 NOTE — Progress Notes (Signed)
 Subjective:  Kathy [name] is a 68 y.o. female who presents for Chief Complaint  Patient presents with   Hospitalization Follow-up    Weakness,dizziness with fluid around lungs. Doing better now     Kathy [name] is a 68 year old female presents for follow-up after hospitalization for shortness of breath.  She was recently hospitalized for shortness of breath, during which fluid was found in her lungs, attributed to kidney and heart issues. She is not on dialysis, but a shunt is ready, and she has an appointment with her nephrologist next week.  She has experienced significant weight loss since her hospital discharge, from 238 pounds in June to 207 lb today.  She started taking torsemide 50 mg daily after her hospital visit recently. No current leg swelling, which was present before her hospitalization.  She tries to drink plenty of water and does not feel dehydrated.  She received a flu shot during her hospital stay.  Admit date: 06/10/2024 Discharge date: 06/13/2024 Admitted From: Home Disposition: Home Recommendations for Outpatient Follow-up:  Follow up with PCP in 1-2 weeks Please obtain BMP/CBC in one week Please follow up with cardiology   Home Health: None  Equipment/Devices none    Discharge Condition: Stable CODE STATUS: Full code Diet recommendation: Renal cardiac diet Brief/Interim Summary:    68 y.o. female past medical history significant for essential hypertension, hyperlipidemia, diabetes mellitus type 2, chronic kidney disease stage IV (with a baseline creatinine around 3.5) comes into the ED complaining of dyspnea and cough.  Blood pressure significantly elevated, she was satting greater than 88% on room air, remains afebrile CT of the chest showed bilateral pleural effusions, cardiomegaly and bibasilar consolidations.    Discharge Diagnoses:  Principal Problem:   Pulmonary edema Active Problems:   Essential hypertension, benign   PAF (paroxysmal  atrial fibrillation) (HCC)   Hyperlipidemia associated with type 2 diabetes mellitus (HCC)   Poorly controlled type II diabetes mellitus with renal complication (HCC)   Type 2 diabetes mellitus with stage 5 chronic kidney disease not on chronic dialysis, with long-term current use of insulin  (HCC)   Acute pulmonary edema likely due to acute decompensated diastolic dysfunction in the setting of chronic kidney disease stage IV: She was treated with Lasix 80 mg twice a day and discharged on Demadex. She was ambulating in the room and hallway without oxygen and maintained her saturation. Her last 2D echo showed an EF of 55% no regional wall motion abnormality undetermined diastolic parameters. Negative about 2-1/2 L. Her creatinine was 3.54 on discharge.     No other aggravating or relieving factors.    No other c/o.  Past Medical History:  Diagnosis Date   Atrial fibrillation (HCC) 05/2016   Chronic kidney disease    Diabetes mellitus without complication (HCC)    type II   History of Helicobacter pylori infection    Hyperlipidemia    Hypertension    Microalbuminuria 2016   Obesity    Current Outpatient Medications on File Prior to Visit  Medication Sig Dispense Refill   acetaminophen  (TYLENOL ) 325 MG tablet Take 2 tablets (650 mg total) by mouth every 6 (six) hours as needed for mild pain (pain score 1-3) or fever (or Fever >/= 101).     allopurinol  (ZYLOPRIM ) 100 MG tablet TAKE 2 TABLETS(200 MG) BY MOUTH DAILY 60 tablet 5   apixaban  (ELIQUIS ) 5 MG TABS tablet TAKE 1 TABLET(5 MG) BY MOUTH TWICE DAILY 60 tablet 5   atenolol  (TENORMIN )  50 MG tablet Take 1 tablet (50 mg total) by mouth 2 (two) times daily. 180 tablet 3   ferrous sulfate 325 (65 FE) MG EC tablet Take 1 tablet (325 mg total) by mouth daily.     hydrALAZINE  (APRESOLINE ) 50 MG tablet Take 50 mg by mouth 2 (two) times daily.     Insulin  Lispro Prot & Lispro (HUMALOG  MIX 75/25 KWIKPEN) (75-25) 100 UNIT/ML Kwikpen Humalog  MIX  10 units with Breakfast and 14 units with Supper 30 mL 1   LOKELMA 10 g PACK packet Take 10 g by mouth 3 (three) times a week.     OZEMPIC , 0.25 OR 0.5 MG/DOSE, 2 MG/3ML SOPN INJECT 0.5 MG UNDER THE SKIN ONCE WEEKLY 3 mL 3   psyllium (HYDROCIL/METAMUCIL) 95 % PACK Take 1 packet by mouth daily.     rosuvastatin  (CRESTOR ) 10 MG tablet Take 1 tablet (10 mg total) by mouth daily. 90 tablet 2   torsemide (DEMADEX) 100 MG tablet Take 0.5 tablets (50 mg total) by mouth daily. 30 tablet 2   vitamin C (ASCORBIC ACID) 250 MG tablet Take 250 mg by mouth daily.     blood glucose meter kit and supplies KIT Dispense based on patient and insurance preference. Use up to four times daily as directed. (FOR ICD-9 250.00, 250.01). 1 each 0   glucose blood test strip Contour Next test strips. Use as instructed 100 each 6   Insulin  Pen Needle 32G X 4 MM MISC 1 Device by Does not apply route in the morning and at bedtime. 200 each 3   Multiple Vitamin (MULTIVITAMIN WITH MINERALS) TABS tablet Take 1 tablet by mouth daily.     No current facility-administered medications on file prior to visit.     The following portions of the patient's history were reviewed and updated as appropriate: allergies, current medications, past family history, past medical history, past social history, past surgical history and problem list.  ROS Otherwise as in subjective above   Objective: BP 110/70   Pulse (!) 56   Wt 207 lb 12.8 oz (94.3 kg)   SpO2 100%   BMI (P) 31.60 kg/m   Wt Readings from Last 3 Encounters:  06/19/24 207 lb 12.8 oz (94.3 kg)  06/11/24 (P) 228 lb 13.4 oz (103.8 kg)  06/03/24 215 lb (97.5 kg)   BP Readings from Last 3 Encounters:  06/19/24 110/70  06/13/24 (!) 152/60  06/03/24 (!) 149/62    General appearance: alert, no distress, well developed, well nourished Neck: supple, no lymphadenopathy, no thyromegaly, no masses, no JVD Oral: somewhat dry MM Heart: RRR, normal S1, S2, no murmurs Lungs:  CTA bilaterally, no wheezes, rhonchi, or rales Pulses: 2+ radial pulses, 1+ pedal pulses, normal cap refill Ext: no edema    Assessment: Encounter Diagnoses  Name Primary?   Acute pulmonary edema (HCC) Yes   Dyspnea, unspecified type    CKD (chronic kidney disease) stage 5, GFR less than 15 ml/min (HCC)    Essential hypertension, benign    PAF (paroxysmal atrial fibrillation) (HCC)    Type 2 diabetes mellitus with stage 5 chronic kidney disease not on chronic dialysis, with long-term current use of insulin  (HCC)    Hospitalization within last 30 days    Need for COVID-19 vaccine      Plan: I reviewed her hospitalization records, discharge summary, medicines reconciled  Labs advised per hospital discharge, but she declines, will plan on labs with nehprology follow up next week.  I reviewed her echocardiogram from 03/19/24.   Sleep study and pulmonology consult advised at that time along with other treatment given increased pulmonary pressure, but she declined all of those recommendations.  She appears to be a little dry today.  I will have her hold her torsemide today and again on Friday of this week and 3 days but otherwise continue torsemide 50 mg daily and follow-up with nephrology in 6 days as planned  Continue your other medicines as usual  I reiterated the recommendations from cardiology back in July to have a sleep study and pulmonology consult but she declines.  Follow-up with your diabetes doctor as planned.  Follow-up with nephrology next week.  Advise daily weights and advise she take a list of her weight readings to nephrology next week for appointment  COVID-vaccine administered today   Kathy [name] was seen today for hospitalization follow-up.  Diagnoses and all orders for this visit:  Acute pulmonary edema (HCC)  Dyspnea, unspecified type  CKD (chronic kidney disease) stage 5, GFR less than 15 ml/min (HCC)  Essential hypertension, benign  PAF (paroxysmal  atrial fibrillation) (HCC)  Type 2 diabetes mellitus with stage 5 chronic kidney disease not on chronic dialysis, with long-term current use of insulin  (HCC)  Hospitalization within last 30 days  Need for COVID-19 vaccine    Follow up: next week with nephrology

## 2024-06-25 DIAGNOSIS — E1122 Type 2 diabetes mellitus with diabetic chronic kidney disease: Secondary | ICD-10-CM | POA: Diagnosis not present

## 2024-06-25 DIAGNOSIS — I129 Hypertensive chronic kidney disease with stage 1 through stage 4 chronic kidney disease, or unspecified chronic kidney disease: Secondary | ICD-10-CM | POA: Diagnosis not present

## 2024-06-25 DIAGNOSIS — I120 Hypertensive chronic kidney disease with stage 5 chronic kidney disease or end stage renal disease: Secondary | ICD-10-CM | POA: Diagnosis not present

## 2024-06-25 DIAGNOSIS — N2581 Secondary hyperparathyroidism of renal origin: Secondary | ICD-10-CM | POA: Diagnosis not present

## 2024-06-25 DIAGNOSIS — I770 Arteriovenous fistula, acquired: Secondary | ICD-10-CM | POA: Diagnosis not present

## 2024-06-25 DIAGNOSIS — N189 Chronic kidney disease, unspecified: Secondary | ICD-10-CM | POA: Diagnosis not present

## 2024-06-25 DIAGNOSIS — D631 Anemia in chronic kidney disease: Secondary | ICD-10-CM | POA: Diagnosis not present

## 2024-06-25 DIAGNOSIS — N185 Chronic kidney disease, stage 5: Secondary | ICD-10-CM | POA: Diagnosis not present

## 2024-06-25 DIAGNOSIS — E1129 Type 2 diabetes mellitus with other diabetic kidney complication: Secondary | ICD-10-CM | POA: Diagnosis not present

## 2024-06-26 ENCOUNTER — Encounter: Payer: Self-pay | Admitting: Internal Medicine

## 2024-06-26 ENCOUNTER — Ambulatory Visit (INDEPENDENT_AMBULATORY_CARE_PROVIDER_SITE_OTHER)

## 2024-06-26 ENCOUNTER — Ambulatory Visit (INDEPENDENT_AMBULATORY_CARE_PROVIDER_SITE_OTHER): Admitting: Internal Medicine

## 2024-06-26 VITALS — BP 124/82 | HR 78 | Ht 68.0 in | Wt 207.2 lb

## 2024-06-26 DIAGNOSIS — Z794 Long term (current) use of insulin: Secondary | ICD-10-CM

## 2024-06-26 DIAGNOSIS — D3502 Benign neoplasm of left adrenal gland: Secondary | ICD-10-CM

## 2024-06-26 DIAGNOSIS — N184 Chronic kidney disease, stage 4 (severe): Secondary | ICD-10-CM | POA: Diagnosis not present

## 2024-06-26 DIAGNOSIS — E1122 Type 2 diabetes mellitus with diabetic chronic kidney disease: Secondary | ICD-10-CM

## 2024-06-26 DIAGNOSIS — Z0000 Encounter for general adult medical examination without abnormal findings: Secondary | ICD-10-CM

## 2024-06-26 MED ORDER — INSULIN LISPRO PROT & LISPRO (75-25 MIX) 100 UNIT/ML KWIKPEN: PEN_INJECTOR | SUBCUTANEOUS | 3 refills | Status: AC

## 2024-06-26 MED ORDER — OZEMPIC (0.25 OR 0.5 MG/DOSE) 2 MG/3ML ~~LOC~~ SOPN: 0.5000 mg | PEN_INJECTOR | SUBCUTANEOUS | 3 refills | Status: AC

## 2024-06-26 MED ORDER — DEXAMETHASONE 1 MG PO TABS: 1.0000 mg | ORAL_TABLET | Freq: Once | ORAL | 0 refills | Status: AC

## 2024-06-26 NOTE — Progress Notes (Signed)
 Name: Kathy [name]  Age/ Sex: 68 y.o., female   MRN/ DOB: [id number], 1956-08-30     PCP: Kathy [name]   Reason for Endocrinology Evaluation: Type 2 Diabetes Mellitus  Initial Endocrine Consultative Visit: 05/31/2019    PATIENT IDENTIFIER: [name] [name] a 68 y.o. female with a past medical history of T2Dm, HTN, A.Fib. The patient has followed with Endocrinology clinic since 05/31/2019 for consultative assistance with management of her diabetes.  DIABETIC HISTORY:  Kathy [name] was diagnosed with T2DM in 2001. Farxiga , Glipizide  (Stopped 02/2019), tradjenta , metfoRMIN  . Rybelsus  started 04/2019 but did not help so she stopped it and restarted metformin , in 05/2019 metformin  was stopped again and was advised not to take it due to low GFR. Her hemoglobin A1c has ranged from 6.7% in 2015, peaking at 10.4% in 2020.  Ozempic  started 01/2020 but she discontinued due to symptoms of dehydration and the patient attributed low GFR to it  We stopped glipizide  and started her on Humalog  mix by June 2023 with an A1c of 10.3% Farxiga  by nephrology 2023   I have attempted to prescribe Mounjaro  12/2022  but this was cost prohibitive.  Ozempic  restarted 06/2023  ADRENAL HISTORY: Pt was noted to have an incidental finding of a left adrenal adenoma 1.4 cm on MRI from 05/2018 during evaluation of a renal cysts. A CT scan of adrenal ( no contrast due to CKD and pt declined MRI 03/05/2020) showed an indeterminate left adrenal adenoma at 1 cm.    Labs were normal to include aldo, cortisol and metanephrines.   SUBJECTIVE:   During the last visit (12/26/2023): A1c 6.3%     Today (06/26/2024): Kathy [name] here for a follow up on diabetes management.  She checks her blood sugars 2  times daily.The patient has not  had hypoglycemic episodes since the last clinic visit.  She continues to follow-up with cardiology for paroxysmal atrial fibrillation She follows with nephrology  [name]  S/P AVF 04/2024  Patient was hospitalized due to dyspnea and cough due to pulmonary edema in September, 2025  Shortness of breath has improved, patient on diuretics She denies any nausea vomiting Constipation has improved   HOME DIABETES REGIMEN:  Humalog  mix 10 units QAM  14 units QPM  Ozempic  0.5 mg weekly     GLUCOSE Meter: unable to download 30-day average 103 MGs/DL  Range 02-8 28 mg/dL    DIABETIC COMPLICATIONS: Microvascular complications:  CKDIII Denies: retinopathy , neuropathy  Last eye exam: scheduled 12/2022   Macrovascular complications:    Denies: CAD, PVD, CVA       HISTORY:  Past Medical History:  Past Medical History:  Diagnosis Date   Atrial fibrillation (HCC) 05/2016   Chronic kidney disease    Diabetes mellitus without complication (HCC)    type II   History of Helicobacter pylori infection    Hyperlipidemia    Hypertension    Microalbuminuria 2016   Obesity    Past Surgical History:  Past Surgical History:  Procedure Laterality Date   AV FISTULA PLACEMENT Left 02/01/2024   Procedure: LEFT ARTERIOVENOUS (AV) FISTULA CREATION;  Surgeon: Pearline Norman GORMAN, MD;  Location: [facility] OR;  Service: Vascular;  Laterality: Left;   COLONOSCOPY     age 36yo   FISTULA SUPERFICIALIZATION Left 04/17/2024   Procedure: LEFT ARM ARTERIOVENOUS FISTULA SUPERFICIALIZATION;  Surgeon: Pearline Norman GORMAN, MD;  Location: [facility] OR;  Service: Vascular;  Laterality: Left;   TONSILLECTOMY  Social History:  reports that she has never smoked. She has never used smokeless tobacco. She reports that she does not currently use alcohol. She reports that she does not use drugs. Family History:  Family History  Problem Relation Age of Onset   Stroke Mother    Cancer Father        throat   Diabetes Sister    Diabetes Sister    Diabetes Sister    Cancer Sister        breast   Breast cancer Sister    Heart disease Neg Hx      HOME MEDICATIONS: Allergies as of 06/26/2024        Reactions   Lactose Intolerance (gi)    Bloating and flatulence   Latex Itching   Xarelto  [rivaroxaban ]    Vaginal Bleeding        Medication List        Accurate as of June 26, 2024  3:23 PM. If you have any questions, ask your nurse or doctor.          acetaminophen  325 MG tablet Commonly known as: TYLENOL  Take 2 tablets (650 mg total) by mouth every 6 (six) hours as needed for mild pain (pain score 1-3) or fever (or Fever >/= 101).   allopurinol  100 MG tablet Commonly known as: ZYLOPRIM  TAKE 2 TABLETS(200 MG) BY MOUTH DAILY   atenolol  50 MG tablet Commonly known as: TENORMIN  Take 1 tablet (50 mg total) by mouth 2 (two) times daily.   blood glucose meter kit and supplies Kit Dispense based on patient and insurance preference. Use up to four times daily as directed. (FOR ICD-9 250.00, 250.01).   dexamethasone  1 MG tablet Commonly known as: DECADRON  Take 1 tablet (1 mg total) by mouth once for 1 dose. Started by: Donell PARAS Jontrell Bushong   Eliquis  5 MG Tabs tablet Generic drug: apixaban  TAKE 1 TABLET(5 MG) BY MOUTH TWICE DAILY   ferrous sulfate 325 (65 FE) MG EC tablet Take 1 tablet (325 mg total) by mouth daily.   glucose blood test strip Contour Next test strips. Use as instructed   hydrALAZINE  50 MG tablet Commonly known as: APRESOLINE  Take 50 mg by mouth 2 (two) times daily.   Insulin  Lispro Prot & Lispro (75-25) 100 UNIT/ML Kwikpen Commonly known as: HumaLOG  Mix 75/25 KwikPen Humalog  MIX 10 units with Breakfast and 14 units with Supper   Insulin  Pen Needle 32G X 4 MM Misc 1 Device by Does not apply route in the morning and at bedtime.   Lokelma 10 g Pack packet Generic drug: sodium zirconium cyclosilicate Take 10 g by mouth 3 (three) times a week.   multivitamin with minerals Tabs tablet Take 1 tablet by mouth daily.   Ozempic  (0.25 or 0.5 MG/DOSE) 2 MG/3ML Sopn Generic drug: Semaglutide (0.25 or 0.5MG /DOS) INJECT 0.5 MG UNDER THE SKIN  ONCE WEEKLY   psyllium 95 % Pack Commonly known as: HYDROCIL/METAMUCIL Take 1 packet by mouth daily.   rosuvastatin  10 MG tablet Commonly known as: Crestor  Take 1 tablet (10 mg total) by mouth daily.   torsemide 100 MG tablet Commonly known as: DEMADEX Take 0.5 tablets (50 mg total) by mouth daily.   vitamin C 250 MG tablet Commonly known as: ASCORBIC ACID Take 250 mg by mouth daily.         OBJECTIVE:   Vital Signs:BP 124/82 (BP Location: Left Arm, Patient Position: Sitting, Cuff Size: Large)   Pulse 78   Ht 5' 8 (1.727  m)   Wt 207 lb 3.2 oz (94 kg)   SpO2 98%   BMI 31.50 kg/m   Wt Readings from Last 3 Encounters:  06/26/24 207 lb 3.2 oz (94 kg)  06/19/24 207 lb 12.8 oz (94.3 kg)  06/11/24 (P) 228 lb 13.4 oz (103.8 kg)     Exam: General: Pt appears well and [name] in NAD  Lungs: Clear with good BS bilat   Heart: RRR   Extremities: Trace pretibial edema.  Neuro: MS [name] good with appropriate affect, pt [name] alert and Ox3      DM foot exam: 06/26/2024 The skin of the feet [name] intact without sores or ulcerations. The pedal pulses are 2+ on right and 2+ on left. The sensation [name] intact to a screening 5.07, 10 gram monofilament bilaterally    DATA REVIEWED:  Lab Results  Component Value Date   HGBA1C 5.5 06/11/2024   HGBA1C 6.3 (A) 12/26/2023   HGBA1C 7.8 (A) 06/27/2023     Latest Reference Range & Units 06/13/24 03:21  Sodium 135 - 145 mmol/L 137  Potassium 3.5 - 5.1 mmol/L 3.9  Chloride 98 - 111 mmol/L 105  CO2 22 - 32 mmol/L 21 (L)  Glucose 70 - 99 mg/dL 894 (H)  BUN 8 - 23 mg/dL 32 (H)  Creatinine 9.55 - 1.00 mg/dL 6.45 (H)  Calcium  8.9 - 10.3 mg/dL 9.1  Anion gap 5 - 15  11  GFR, Estimated >60 mL/min 13 (L)    Labs to [facility] 03/10/2024 BUN 35 Calcium  9.6 Creatinine 2.85 GFR 18 Glucose 166 MA/CR 3580 mg  CT scan 09/25/2021  Adrenals/Urinary Tract: Stable 1.2 cm left adrenal nodule. No right adrenal nodule [name] seen.   ASSESSMENT /  PLAN / RECOMMENDATIONS:   1) Type 2 Diabetes Mellitus, Optimally  controlled, With CKD IV complications - Most recent A1c of 5.5 Goal A1c < 7.0 %.    -A1c 5.5%, without hypoglycemia - Declines CGM technology  - She was on farxiga  through nephrology but this was discontinued by nephrology  -I have attempted to prescribe Mounjaro  12/2022 but this was cost prohibitive - We have opted to remain on current dose of Ozempic  as she continues to lose weight - I have recommended decreasing suppertime Humalog  mix preemptively to prevent hypoglycemia  MEDICATIONS: - Continue Ozempic   0.5 mg weekly - Change  Humalog  mix 10 units with Breakfast and 12 units with Supper    EDUCATION / INSTRUCTIONS: BG monitoring instructions: Patient [name] instructed to check her blood sugars 3 times a day, before meals  Call [facility] if: BG persistently < 70 I reviewed the Rule of 15 for the treatment of hypoglycemia in detail with the patient. Literature supplied.  2. Left Adrenal Adenoma :   - Stable imaging (09/2021) from 2021 - No clinical or biochemical evidence of extra hormonal excretion ( labs 12/2022) - Patient will return for dexamethasone  suppression test, aldosterone, and renin check  F/U in 6 months    Signed electronically by: Stefano Redgie Butts, MD  [facility] Endocrinology  [facility] [address] [place], [place] [address] Phone: [phone] FAX: [phone]   CC: Kathy Alm RAMAN, PA-C [address] [place] [address] Phone: [phone]  Fax: [phone]  Return to Endocrinology clinic as below: Future Appointments  Date Time Provider Department Center  07/02/2024  8:00 AM [facility]/NEURO LAB [facility] None  12/25/2024  2:00 PM Marbeth Smedley, Donell Redgie, MD [facility] None  07/02/2025 11:30 AM  [facility]-ANNUAL WELLNESS VISIT [facility] [address]

## 2024-06-26 NOTE — Patient Instructions (Signed)
 Kathy [name],  Thank you for taking the time for your Medicare Wellness Visit. I appreciate your continued commitment to your health goals. Please review the care plan we discussed, and feel free to reach out if I can assist you further.  Medicare recommends these wellness visits once per year to help you and your care team stay ahead of potential health issues. These visits are designed to focus on prevention, allowing your provider to concentrate on managing your acute and chronic conditions during your regular appointments.  Please note that Annual Wellness Visits do not include a physical exam. Some assessments may be limited, especially if the visit was conducted virtually. If needed, we may recommend a separate in-person follow-up with your provider.  Ongoing Care Seeing your primary care provider every 3 to 6 months helps us  monitor your health and provide consistent, personalized care.   Referrals If a referral was made during today's visit and you haven't received any updates within two weeks, please contact the referred provider directly to check on the status.  Recommended Screenings:  Health Maintenance  Topic Date Due   Eye exam for diabetics  12/20/2018   DTaP/Tdap/Td vaccine (2 - Td or Tdap) 06/19/2025*   Cologuard (Stool DNA test)  11/19/2024   Hemoglobin A1C  12/09/2024   Breast Cancer Screening  02/27/2025   Complete foot exam   06/26/2025   Medicare Annual Wellness Visit  06/26/2025   Pneumococcal Vaccine for age over 12  Completed   Flu Shot  Completed   DEXA scan (bone density measurement)  Completed   Hepatitis C Screening  Completed   Zoster (Shingles) Vaccine  Completed   Meningitis B Vaccine  Aged Out   COVID-19 Vaccine  Discontinued  *Topic was postponed. The date shown is not the original due date.       06/26/2024   11:31 AM  Advanced Directives  Does Patient Have a Medical Advance Directive? No   Advance Care Planning is important because  it: Ensures you receive medical care that aligns with your values, goals, and preferences. Provides guidance to your family and loved ones, reducing the emotional burden of decision-making during critical moments.  Vision: Annual vision screenings are recommended for early detection of glaucoma, cataracts, and diabetic retinopathy. These exams can also reveal signs of chronic conditions such as diabetes and high blood pressure.  Dental: Annual dental screenings help detect early signs of oral cancer, gum disease, and other conditions linked to overall health, including heart disease and diabetes.  Please see the attached documents for additional preventive care recommendations.

## 2024-06-26 NOTE — Patient Instructions (Signed)
 Instructions for Dexamethasone  Suppression Test   Step 1: Choose a morning when you can come to our lab at 8:00 am for a blood draw.   Step 2: On the night before the blood draw, take one 1 mg tablet of dexamethasone  at 11:30 pm.  The timing is VERY important!   Step 3: The next morning, go to the lab for blood work at 8:00 am.  Rosine do not have to be on an empty stomach, but the timing is VERY important!    Continue Ozempic   0.5 mg weekly  Change  Humalog  MIX 10 units with Breakfast and 12 units with Supper    HOW TO TREAT LOW BLOOD SUGARS (Blood sugar LESS THAN 70 MG/DL) Please follow the RULE OF 15 for the treatment of hypoglycemia treatment (when your (blood sugars are less than 70 mg/dL)   STEP 1: Take 15 grams of carbohydrates when your blood sugar is low, which includes:  3-4 GLUCOSE TABS  OR 3-4 OZ OF JUICE OR REGULAR SODA OR ONE TUBE OF GLUCOSE GEL    STEP 2: RECHECK blood sugar in 15 MINUTES STEP 3: If your blood sugar is still low at the 15 minute recheck --> then, go back to STEP 1 and treat AGAIN with another 15 grams of carbohydrates.

## 2024-06-26 NOTE — Progress Notes (Signed)
 Subjective:   Kathy [name] is a 68 y.o. who presents for a Medicare Wellness preventive visit.  As a reminder, Annual Wellness Visits don't include a physical exam, and some assessments may be limited, especially if this visit is performed virtually. We may recommend an in-person follow-up visit with your provider if needed.  Visit Complete: Virtual I connected with  Kathy [name] on 06/26/24 by a audio enabled telemedicine application and verified that I am speaking with the correct person using two identifiers.  Patient Location: Home  Provider Location: Office/Clinic  I discussed the limitations of evaluation and management by telemedicine. The patient expressed understanding and agreed to proceed.  Vital Signs: Because this visit was a virtual/telehealth visit, some criteria may be missing or patient reported. Any vitals not documented were not able to be obtained and vitals that have been documented are patient reported.  VideoError- Librarian, academic were attempted between this provider and patient, however failed, due to patient having technical difficulties OR patient did not have access to video capability.  We continued and completed visit with audio only.   Persons Participating in Visit: Patient.  AWV Questionnaire: No: Patient Medicare AWV questionnaire was not completed prior to this visit.  Cardiac Risk Factors include: advanced age (>56men, >53 women);diabetes mellitus;dyslipidemia;hypertension     Objective:    Today's Vitals   There is no height or weight on file to calculate BMI.     06/26/2024   11:31 AM 06/10/2024    6:58 PM 06/03/2024    1:36 AM 04/17/2024    6:16 AM 02/01/2024    9:07 AM 02/08/2023   11:46 AM 09/25/2021    3:18 AM  Advanced Directives  Does Patient Have a Medical Advance Directive? No No No No No No No  Would patient like information on creating a medical advance directive?  No - Patient declined  No - Patient declined No - Patient declined No - Patient declined  No - Patient declined    Current Medications (verified) Outpatient Encounter Medications as of 06/26/2024  Medication Sig   acetaminophen  (TYLENOL ) 325 MG tablet Take 2 tablets (650 mg total) by mouth every 6 (six) hours as needed for mild pain (pain score 1-3) or fever (or Fever >/= 101).   allopurinol  (ZYLOPRIM ) 100 MG tablet TAKE 2 TABLETS(200 MG) BY MOUTH DAILY   apixaban  (ELIQUIS ) 5 MG TABS tablet TAKE 1 TABLET(5 MG) BY MOUTH TWICE DAILY   atenolol  (TENORMIN ) 50 MG tablet Take 1 tablet (50 mg total) by mouth 2 (two) times daily.   blood glucose meter kit and supplies KIT Dispense based on patient and insurance preference. Use up to four times daily as directed. (FOR ICD-9 250.00, 250.01).   dexamethasone  (DECADRON ) 1 MG tablet Take 1 tablet (1 mg total) by mouth once for 1 dose.   ferrous sulfate 325 (65 FE) MG EC tablet Take 1 tablet (325 mg total) by mouth daily.   glucose blood test strip Contour Next test strips. Use as instructed   hydrALAZINE  (APRESOLINE ) 50 MG tablet Take 50 mg by mouth 2 (two) times daily.   Insulin  Lispro Prot & Lispro (HUMALOG  MIX 75/25 KWIKPEN) (75-25) 100 UNIT/ML Kwikpen Humalog  MIX 10 units with Breakfast and 14 units with Supper   Insulin  Pen Needle 32G X 4 MM MISC 1 Device by Does not apply route in the morning and at bedtime.   LOKELMA 10 g PACK packet Take 10 g by mouth 3 (three) times  a week.   Multiple Vitamin (MULTIVITAMIN WITH MINERALS) TABS tablet Take 1 tablet by mouth daily.   OZEMPIC , 0.25 OR 0.5 MG/DOSE, 2 MG/3ML SOPN INJECT 0.5 MG UNDER THE SKIN ONCE WEEKLY   psyllium (HYDROCIL/METAMUCIL) 95 % PACK Take 1 packet by mouth daily.   rosuvastatin  (CRESTOR ) 10 MG tablet Take 1 tablet (10 mg total) by mouth daily.   torsemide (DEMADEX) 100 MG tablet Take 0.5 tablets (50 mg total) by mouth daily.   vitamin C (ASCORBIC ACID) 250 MG tablet Take 250 mg by mouth daily.   No  facility-administered encounter medications on file as of 06/26/2024.    Allergies (verified) Lactose intolerance (gi), Latex, and Xarelto  [rivaroxaban ]   History: Past Medical History:  Diagnosis Date   Atrial fibrillation (HCC) 05/2016   Chronic kidney disease    Diabetes mellitus without complication (HCC)    type II   History of Helicobacter pylori infection    Hyperlipidemia    Hypertension    Microalbuminuria 2016   Obesity    Past Surgical History:  Procedure Laterality Date   AV FISTULA PLACEMENT Left 02/01/2024   Procedure: LEFT ARTERIOVENOUS (AV) FISTULA CREATION;  Surgeon: Pearline Norman RAMAN, MD;  Location: [facility] OR;  Service: Vascular;  Laterality: Left;   COLONOSCOPY     age 82yo   FISTULA SUPERFICIALIZATION Left 04/17/2024   Procedure: LEFT ARM ARTERIOVENOUS FISTULA SUPERFICIALIZATION;  Surgeon: Pearline Norman RAMAN, MD;  Location: [facility] OR;  Service: Vascular;  Laterality: Left;   TONSILLECTOMY     Family History  Problem Relation Age of Onset   Stroke Mother    Cancer Father        throat   Diabetes Sister    Diabetes Sister    Diabetes Sister    Cancer Sister        breast   Breast cancer Sister    Heart disease Neg Hx    Social History   Socioeconomic History   Marital status: Married    Spouse name: Not on file   Number of children: Not on file   Years of education: Not on file   Highest education level: Not on file  Occupational History   Not on file  Tobacco Use   Smoking status: Never   Smokeless tobacco: Never  Vaping Use   Vaping status: Never Used  Substance and Sexual Activity   Alcohol use: Not Currently    Comment: rarely   Drug use: No   Sexual activity: Not on file  Other Topics Concern   Not on file  Social History Narrative   Lives with husband and son.   Retired.    Does housework, cooks, clean, busy.  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   Social Drivers of Health   Financial Resource Strain: Low Risk  (06/26/2024)   Overall Financial Resource  Strain (CARDIA)    Difficulty of Paying Living Expenses: Not hard at all  Food Insecurity: No Food Insecurity (06/26/2024)   Hunger Vital Sign    Worried About Running Out of Food in the Last Year: Never true    Ran Out of Food in the Last Year: Never true  Transportation Needs: No Transportation Needs (06/26/2024)   PRAPARE - Administrator, Civil Service (Medical): No    Lack of Transportation (Non-Medical): No  Physical Activity: Insufficiently Active (06/26/2024)   Exercise Vital Sign    Days of Exercise per Week: 3 days    Minutes of Exercise per Session: 20  min  Stress: No Stress Concern Present (06/26/2024)   [name] of Occupational Health - Occupational Stress Questionnaire    Feeling of Stress: Not at all  Social Connections: Moderately Isolated (06/26/2024)   Social Connection and Isolation Panel    Frequency of Communication with Friends and Family: Three times a week    Frequency of Social Gatherings with Friends and Family: Never    Attends Religious Services: Never    Database administrator or Organizations: No    Attends Engineer, structural: Never    Marital Status: Married    Tobacco Counseling Counseling given: Not Answered    Clinical Intake:  Pre-visit preparation completed: Yes  Pain : No/denies pain     Nutritional Risks: None Diabetes: Yes CBG done?: No Did pt. bring in CBG monitor from home?: No  Lab Results  Component Value Date   HGBA1C 5.5 06/11/2024   HGBA1C 6.3 (A) 12/26/2023   HGBA1C 7.8 (A) 06/27/2023     How often do you need to have someone help you when you read instructions, pamphlets, or other written materials from your doctor or pharmacy?: 1 - Never  Interpreter Needed?: No  Information entered by :: NAllen LPN   Activities of Daily Living     06/26/2024   11:27 AM 06/11/2024    1:25 PM  In your present state of health, do you have any difficulty performing the following activities:   Hearing? 0 0  Vision? 0 0  Difficulty concentrating or making decisions? 0 0  Walking or climbing stairs? 1   Dressing or bathing? 0   Doing errands, shopping? 1   Comment has someone with her   Preparing Food and eating ? N   Using the Toilet? N   In the past six months, have you accidently leaked urine? N   Do you have problems with loss of bowel control? N   Managing your Medications? N   Managing your Finances? N   Housekeeping or managing your Housekeeping? N     Patient Care Team: Tysinger, Alm RAMAN, PA-C as PCP - General (Family Medicine) Santo Stanly LABOR, MD as PCP - Cardiology (Cardiology) [facility], Donell Cardinal, MD as Attending Physician (Endocrinology) Marlee Bernardino NOVAK, MD as Attending Physician (Nephrology) Johnson Brittany M, PA-C as Physician Assistant (Cardiology) Debera Jayson MATSU, MD as Consulting Physician (Cardiology)  I have updated your Care Teams any recent Medical Services you may have received from other providers in the past year.     Assessment:   This is a routine wellness examination for Stepheny.  Hearing/Vision screen Hearing Screening - Comments:: Denie shearing issues Vision Screening - Comments:: Regular eye exams, [facility]   Goals Addressed             This Visit's Progress    Patient Stated       06/26/2024, wants to eat healthy       Depression Screen     06/26/2024   11:33 AM 06/14/2024    3:10 PM 03/05/2024   10:39 AM 02/08/2023   11:47 AM 04/14/2021   11:01 AM 03/03/2021    8:31 AM 12/06/2019    9:10 AM  PHQ 2/9 Scores  PHQ - 2 Score 0 0 0 0 0 0 0  PHQ- 9 Score 2   0       Fall Risk     06/26/2024   11:32 AM 03/05/2024   10:39 AM 02/08/2023   11:47 AM 04/14/2021  11:00 AM 12/06/2019    9:10 AM  Fall Risk   Falls in the past year? 0 0 0 0 0   Number falls in past yr: 0 0 0 0   Injury with Fall? 0 0 0 0   Risk for fall due to : Medication side effect No Fall Risks Medication side effect No Fall Risks    Follow up Falls evaluation completed;Falls prevention discussed Falls evaluation completed Falls prevention discussed;Education provided;Falls evaluation completed Falls evaluation completed       Data saved with a previous flowsheet row definition    MEDICARE RISK AT HOME:  Medicare Risk at Home Any stairs in or around the home?: Yes If so, are there any without handrails?: No Home free of loose throw rugs in walkways, pet beds, electrical cords, etc?: Yes Adequate lighting in your home to reduce risk of falls?: Yes Life alert?: No Use of a cane, walker or w/c?: No Grab bars in the bathroom?: No Shower chair or bench in shower?: No Elevated toilet seat or a handicapped toilet?: Yes  TIMED UP AND GO:  Was the test performed?  No  Cognitive Function: 6CIT completed        06/26/2024   11:34 AM 02/08/2023   11:50 AM  6CIT Screen  What Year? 0 points 0 points  What month? 0 points 0 points  What time? 0 points 0 points  Count back from 20 0 points 0 points  Months in reverse 4 points 2 points  Repeat phrase 0 points 0 points  Total Score 4 points 2 points    Immunizations Immunization History  Administered Date(s) Administered   Fluad Quad(high Dose 65+) 06/21/2022   INFLUENZA, HIGH DOSE SEASONAL PF 06/13/2024   Influenza,inj,Quad PF,6+ Mos 08/06/2013, 05/24/2016, 06/20/2018, 09/18/2020   PFIZER(Purple Top)SARS-COV-2 Vaccination 04/09/2020, 05/10/2020   PNEUMOCOCCAL CONJUGATE-20 02/15/2023   Pfizer(Comirnaty)Fall Seasonal Vaccine 12 years and older 06/19/2024   Pneumococcal Polysaccharide-23 08/06/2013, 03/03/2021   Tdap 03/13/2014   Zoster Recombinant(Shingrix) 04/14/2021, 06/17/2021    Screening Tests Health Maintenance  Topic Date Due   OPHTHALMOLOGY EXAM  12/20/2018   DTaP/Tdap/Td (2 - Td or Tdap) 06/19/2025 (Originally 03/13/2024)   Fecal DNA (Cologuard)  11/19/2024   HEMOGLOBIN A1C  12/09/2024   Mammogram  02/27/2025   FOOT EXAM  06/26/2025   Medicare  Annual Wellness (AWV)  06/26/2025   Pneumococcal Vaccine: 50+ Years  Completed   Influenza Vaccine  Completed   DEXA SCAN  Completed   Hepatitis C Screening  Completed   Zoster Vaccines- Shingrix  Completed   Meningococcal B Vaccine  Aged Out   COVID-19 Vaccine  Discontinued    Health Maintenance Items Addressed: Has eye appointment end of November.  Additional Screening:  Vision Screening: Recommended annual ophthalmology exams for early detection of glaucoma and other disorders of the eye. Is the patient up to date with their annual eye exam?  No  Who is the provider or what is the name of the office in which the patient attends annual eye exams? [facility]  Dental Screening: Recommended annual dental exams for proper oral hygiene  Community Resource Referral / Chronic Care Management: CRR required this visit?  No   CCM required this visit?  No   Plan:    I have personally reviewed and noted the following in the patient's chart:   Medical and social history Use of alcohol, tobacco or illicit drugs  Current medications and supplements including opioid prescriptions. Patient is not currently taking  opioid prescriptions. Functional ability and status Nutritional status Physical activity Advanced directives List of other physicians Hospitalizations, surgeries, and ER visits in previous 12 months Vitals Screenings to include cognitive, depression, and falls Referrals and appointments  In addition, I have reviewed and discussed with patient certain preventive protocols, quality metrics, and best practice recommendations. A written personalized care plan for preventive services as well as general preventive health recommendations were provided to patient.   Ardella FORBES Dawn, LPN   [id number]   After Visit Summary: (Pick Up) Due to this being a telephonic visit, with patients personalized plan was offered to patient and patient has requested to Pick up at office.  Notes:  Nothing significant to report at this time.

## 2024-07-02 ENCOUNTER — Other Ambulatory Visit

## 2024-07-10 ENCOUNTER — Other Ambulatory Visit: Payer: Self-pay | Admitting: Student

## 2024-07-13 ENCOUNTER — Other Ambulatory Visit: Payer: Self-pay | Admitting: Internal Medicine

## 2024-07-23 ENCOUNTER — Ambulatory Visit: Payer: Self-pay | Admitting: Internal Medicine

## 2024-07-23 LAB — CORTISOL: Cortisol, Plasma: 3.9 ug/dL

## 2024-07-23 LAB — POTASSIUM: Potassium: 4.4 mmol/L (ref 3.5–5.3)

## 2024-07-23 LAB — ALDOSTERONE + RENIN ACTIVITY W/ RATIO
Aldosterone: <1 ng/dL
Renin Activity: 0.95 ng/mL/h (ref 0.25–5.82)

## 2024-07-23 LAB — DEXAMETHASONE, BLOOD: Dexamethasone, Serum: 511 ng/dL

## 2024-09-03 LAB — LAB REPORT - SCANNED
Calcium: 10.2
EGFR: 10
PTH: 34

## 2024-09-09 ENCOUNTER — Other Ambulatory Visit: Payer: Self-pay | Admitting: Medical

## 2024-09-13 ENCOUNTER — Other Ambulatory Visit: Payer: Self-pay | Admitting: Medical

## 2024-10-05 NOTE — Progress Notes (Signed)
 Kathy [name]                                          MRN: [id number]   10/05/2024   The [facility] Quality Team Specialist reviewed this patient medical record for the purposes of chart review for care gap closure. The following were reviewed: abstraction for care gap closure-glycemic status assessment.    [facility] Quality Team

## 2024-10-17 ENCOUNTER — Encounter: Admitting: Medical

## 2024-10-18 ENCOUNTER — Other Ambulatory Visit: Payer: Self-pay | Admitting: Medical

## 2024-10-18 DIAGNOSIS — Z1231 Encounter for screening mammogram for malignant neoplasm of breast: Secondary | ICD-10-CM

## 2024-12-13 ENCOUNTER — Ambulatory Visit: Admitting: Student

## 2024-12-25 ENCOUNTER — Ambulatory Visit: Admitting: Internal Medicine

## 2025-02-07 ENCOUNTER — Encounter: Admitting: Medical

## 2025-07-02 ENCOUNTER — Ambulatory Visit: Payer: Self-pay
